# Patient Record
Sex: Female | Born: 1940 | Race: Black or African American | Hispanic: No | Marital: Single | Smoking: Never smoker
Health system: Southern US, Community
[De-identification: ages and names within clinical notes are randomized; demographics above are authoritative.]

## PROBLEM LIST (undated history)

## (undated) DIAGNOSIS — N186 End stage renal disease: Secondary | ICD-10-CM

## (undated) DIAGNOSIS — D649 Anemia, unspecified: Secondary | ICD-10-CM

## (undated) DIAGNOSIS — IMO0001 Reserved for inherently not codable concepts without codable children: Secondary | ICD-10-CM

## (undated) DIAGNOSIS — N189 Chronic kidney disease, unspecified: Secondary | ICD-10-CM

## (undated) DIAGNOSIS — C801 Malignant (primary) neoplasm, unspecified: Secondary | ICD-10-CM

## (undated) DIAGNOSIS — Z992 Dependence on renal dialysis: Secondary | ICD-10-CM

## (undated) DIAGNOSIS — R252 Cramp and spasm: Secondary | ICD-10-CM

## (undated) DIAGNOSIS — J189 Pneumonia, unspecified organism: Secondary | ICD-10-CM

## (undated) DIAGNOSIS — I10 Essential (primary) hypertension: Secondary | ICD-10-CM

## (undated) DIAGNOSIS — R011 Cardiac murmur, unspecified: Secondary | ICD-10-CM

## (undated) DIAGNOSIS — M1990 Unspecified osteoarthritis, unspecified site: Secondary | ICD-10-CM

## (undated) DIAGNOSIS — J3089 Other allergic rhinitis: Secondary | ICD-10-CM

## (undated) HISTORY — PX: BREAST SURGERY: SHX581

## (undated) HISTORY — PX: COLONOSCOPY W/ POLYPECTOMY: SHX1380

## (undated) HISTORY — PX: ABDOMINAL HYSTERECTOMY: SHX81

## (undated) HISTORY — PX: DILATION AND CURETTAGE OF UTERUS: SHX78

---

## 1998-02-24 ENCOUNTER — Emergency Department (HOSPITAL_COMMUNITY): Admission: EM | Admit: 1998-02-24 | Discharge: 1998-02-24 | Payer: Self-pay | Admitting: Emergency Medicine

## 1999-01-09 ENCOUNTER — Emergency Department (HOSPITAL_COMMUNITY): Admission: EM | Admit: 1999-01-09 | Discharge: 1999-01-09 | Payer: Self-pay | Admitting: Emergency Medicine

## 1999-01-09 ENCOUNTER — Encounter: Payer: Self-pay | Admitting: Emergency Medicine

## 1999-01-13 ENCOUNTER — Ambulatory Visit (HOSPITAL_COMMUNITY): Admission: RE | Admit: 1999-01-13 | Discharge: 1999-01-13 | Payer: Self-pay | Admitting: Internal Medicine

## 1999-01-13 ENCOUNTER — Encounter: Payer: Self-pay | Admitting: Internal Medicine

## 1999-09-22 ENCOUNTER — Ambulatory Visit (HOSPITAL_COMMUNITY): Admission: RE | Admit: 1999-09-22 | Discharge: 1999-09-22 | Payer: Self-pay | Admitting: Internal Medicine

## 1999-10-20 ENCOUNTER — Ambulatory Visit (HOSPITAL_COMMUNITY): Admission: RE | Admit: 1999-10-20 | Discharge: 1999-10-20 | Payer: Self-pay | Admitting: Gastroenterology

## 1999-10-20 ENCOUNTER — Encounter (INDEPENDENT_AMBULATORY_CARE_PROVIDER_SITE_OTHER): Payer: Self-pay | Admitting: Specialist

## 2000-10-10 ENCOUNTER — Other Ambulatory Visit: Admission: RE | Admit: 2000-10-10 | Discharge: 2000-10-10 | Payer: Self-pay | Admitting: Internal Medicine

## 2001-10-31 ENCOUNTER — Other Ambulatory Visit: Admission: RE | Admit: 2001-10-31 | Discharge: 2001-10-31 | Payer: Self-pay | Admitting: *Deleted

## 2001-11-10 ENCOUNTER — Ambulatory Visit (HOSPITAL_COMMUNITY): Admission: RE | Admit: 2001-11-10 | Discharge: 2001-11-10 | Payer: Self-pay | Admitting: *Deleted

## 2001-11-27 ENCOUNTER — Ambulatory Visit (HOSPITAL_COMMUNITY): Admission: RE | Admit: 2001-11-27 | Discharge: 2001-11-27 | Payer: Self-pay | Admitting: Internal Medicine

## 2001-11-29 ENCOUNTER — Ambulatory Visit (HOSPITAL_COMMUNITY): Admission: RE | Admit: 2001-11-29 | Discharge: 2001-11-29 | Payer: Self-pay | Admitting: Cardiology

## 2003-04-08 ENCOUNTER — Ambulatory Visit (HOSPITAL_COMMUNITY): Admission: RE | Admit: 2003-04-08 | Discharge: 2003-04-08 | Payer: Self-pay | Admitting: Gastroenterology

## 2004-01-10 ENCOUNTER — Ambulatory Visit (HOSPITAL_COMMUNITY): Admission: RE | Admit: 2004-01-10 | Discharge: 2004-01-10 | Payer: Self-pay | Admitting: *Deleted

## 2004-01-10 ENCOUNTER — Encounter (INDEPENDENT_AMBULATORY_CARE_PROVIDER_SITE_OTHER): Payer: Self-pay | Admitting: Specialist

## 2004-02-07 ENCOUNTER — Inpatient Hospital Stay (HOSPITAL_COMMUNITY): Admission: RE | Admit: 2004-02-07 | Discharge: 2004-02-10 | Payer: Self-pay | Admitting: *Deleted

## 2004-02-07 ENCOUNTER — Encounter (INDEPENDENT_AMBULATORY_CARE_PROVIDER_SITE_OTHER): Payer: Self-pay | Admitting: *Deleted

## 2004-02-07 ENCOUNTER — Encounter (INDEPENDENT_AMBULATORY_CARE_PROVIDER_SITE_OTHER): Payer: Self-pay | Admitting: Specialist

## 2009-11-13 ENCOUNTER — Encounter: Admission: RE | Admit: 2009-11-13 | Discharge: 2009-11-13 | Payer: Self-pay | Admitting: Internal Medicine

## 2010-09-18 NOTE — Op Note (Signed)
[facility]  Patient:    [name], [name] Visit Number: [id number] MRN: [id number]          Service Type: DSU Location: [facility] Attending Physician:  Shelah Lewandowsky Dictated by:   Syble Creek, M.D. Proc. Date: 11/10/01 Admit Date:  11/10/2001 Discharge Date: 11/10/2001   CC:         Lance Muss, M.D.   Operative Report  PREOPERATIVE DIAGNOSES:       Postmenopausal bleeding and cervical stenosis.  POSTOPERATIVE DIAGNOSES:      Postmenopausal bleeding and cervical stenosis.  PROCEDURE:                    Examination under anesthesia, attempted dilatation and curettage, however, could not dilate the cervical os due to extreme cervical stenosis.  SURGEON:                      Syble Creek, M.D.  ANESTHESIA:                   MAC and 20 cc 2% lidocaine.  FINDINGS:                     A virginal introitus of the vagina, completely stenotic cervical os (os was barely visible).  On bimanual examination uterus was mid position, normal size.  No adnexal masses.  Rectovaginal examination confirmed above findings and there was no parametrial abnormality noted on rectovaginal examination.  ESTIMATED BLOOD LOSS:         Less than 50 cc.  COMPLICATIONS:                None.  SPECIMEN:                     None.  DISPOSITION:                  Recovery room, stable.  INDICATIONS:                  A patient with postmenopausal bleeding and could not tolerate an examination in the office.  PROCEDURE:                    The patient was taken to the operating room and MAC anesthesia obtained.  She was placed in the ski position and prepped and draped in a standard fashion.  The bladder was emptied with a red rubber catheter.  A small Peterson speculum was necessary to visualize the cervix.  It was nulliparous in appearance.  The cervical os itself was barely visible as a pin point.  There was a small amount of mucus extruded through it as I placed  the paracervical block.  Attempted to use a tear duct probe to enter into the stenotic cervical os.  However, it would not allow passage.  Therefore, the procedure was terminated.  The patient tolerated procedure well.  There were no complications.  She was taken to the recovery room awake, alert, in stable condition.  The patient will follow up in the office in four weeks and should she have additional postmenopausal bleeding, I would recommend a vaginal ultrasound, perhaps with sedation if necessary, to visualize the endometrial stripe. Dictated by:   Syble Creek, M.D. Attending Physician:  Syble Creek B DD:  11/10/01 TD:  11/13/01 Job: [id number]

## 2010-09-18 NOTE — Procedures (Signed)
[place]  Patient:    [name], [name]                      MRN: [id number] Proc. Date: 10/20/99 Adm. Date:  [id number] Disc. Date: [id number] Attending:  Orvis Brill CC:         Lance Muss, M.D.                           Procedure Report  PROCEDURES:  Colonoscopy with polypectomy.  INDICATIONS:  Polyp on flexible sigmoidoscopy done for screening.  Consent was signed after the risks, benefits, methods, and options were thoroughly discussed in the office.  MEDICINES USED:  Demerol 80 mg and Versed 5 mg.  DESCRIPTION OF PROCEDURE:  The rectal inspection was pertinent for external hemorrhoids.  The digital exam was negative.  The video colonoscope was inserted.  In the lower sigmoid, the two polyps that Lance Muss, M.D., had seen on insertion were clearly seen.  The scope was then advanced to the splenic flexure.  At that point, she had a tortuous splenic flexure and with abdominal pressure we were able to advance around the colon to the cecum.  The prep was adequate.  There was a fair amount of liquid stool that required washing and suctioning, but no slow withdraw through the colon, no cecum, ascending, transverse, or descending abnormalities were seen.  The cecum was identified by the appendiceal orifice and the ileocecal valve.  As the scope was withdrawn around the sigmoid, a tiny 2-3 mm polyp was seen and was hot biopsied x 1.  The larger of the polyps seen on the sigmoidoscopy was brought into view, snared, electrocautery applied, and the polyp was suctioned through the scope and collected in trap.  Two other smaller polyps were seen and were hot biopsied in the sigmoid.  The scope was withdrawn back to the rectum and retroflexed, pertinent for some internal hemorrhoids with some old anal papillae.  The scope was straightened, the air was withdrawn, and the cope was removed.  The patient tolerated the procedure well.  There  was no obvious immediate complication.  ENDOSCOPIC DIAGNOSES: 1. Internal and external hemorrhoids with anal papillae. 2. Four small sigmoid polyps, three hot biopsied and one snared. 3. Tortuous, particularly in the splenic flexure. 4. Otherwise within normal limits to the cecum.  PLAN:  Await pathology to determine future colonic screening.  Will probably recheck in three years.  Otherwise return care to Lance Muss, M.D., for the customary yearly rectals and guaiacs.  Put her on 10-day post polypectomy instructions.  Asked to see back p.r.n. DD:  10/20/99 TD:  10/22/99 Job: [id number]

## 2010-09-18 NOTE — Op Note (Signed)
NAMESALSABEEL, KINKADE NO.:  [id number]   MEDICAL RECORD NO.:  [id number]          PATIENT TYPE:  INP   LOCATION:  [place]                         FACILITY:  [facility]   PHYSICIAN:  Lake Bells B. Rosana Hoes, M.D.  DATE OF BIRTH:  15-Jun-1940   DATE OF PROCEDURE:  02/07/2004  DATE OF DISCHARGE:                                 OPERATIVE REPORT   PREOPERATIVE DIAGNOSES:  Well differentiated endometrial cancer.   POSTOPERATIVE DIAGNOSES:  Well differentiated endometrial cancer.   PROCEDURE:  Total abdominal hysterectomy, bilateral salpingo-oophorectomy,  pelvic and abdominal washing.   SURGEON:  Blair Dolphin. Rosana Hoes, M.D.   ASSISTANT:  Freddie Apley, M.D.   ANESTHESIA:  General.   FINDINGS:  Well differentiated endometrial carcinoma minimally invasive.   SPECIMENS:  Uterus, cervix, tubes and ovaries.   ESTIMATED BLOOD LOSS:  375.   URINE:  250.   FLUIDS:  2500.   COMPLICATIONS:  None.   DRAINS:  Foley.   INDICATIONS FOR PROCEDURE:  A patient with a history of postmenopausal  bleeding and cervical stenosis. 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 The patient presents for definitive surgical therapy.   DESCRIPTION OF PROCEDURE:  The patient was taken to the operating room and  general anesthesia obtained.  She was prepped and draped in standard fashion  and Foley catheter inserted into the bladder.   A vertical incision was made with the knife, carried sharply to the fascia.  The fascia was divided sharply and the posterior sheath and peritoneum  elevated and entered sharply.  The incision was extended inferiorly with  good visualization of the surrounding organs.   Pelvic and abdominal washings were obtained.   The pelvis was inspected and there was no evidence of any gross spread. The  uterine serosa, tubes and ovaries all appeared normal. The upper abdomen was  normal.   Trendelenburg position obtained. The Bookwalter retractor assembled, bowel  packed superiorly with moist packs.   Each uterine cornu was grasped with a Kelly clamp. The right round ligament  was placed on traction, suture ligated, divided.  The posterior sheath of  the peritoneum was divided sharply and the retroperitoneal space developed  bluntly. The course of the ureter identified. The IP ligament was isolated,  doubly clamped, divided, suture ligated x2 with #0 Vicryl and hemostasis  obtained. The procedure repeated on the left side in the same fashion.   The bladder flap was created with sharp dissection.  Each uterine artery was  skeletonized, doubly clamped, divided and suture ligated 2 #0 Vicryl with  hemostasis obtained. The cardinal ligaments were then serially clamped on  each side, divided and suture ligated with #0 Vicryl with hemostasis  obtained. The bladder was advanced further to allow the dissection to  continue. The vaginal angle was clamped on each side, divided, suture  ligated with #0 Vicryl with hemostasis obtained. It felt like we were beyond  the cervix; however, when coming across the vagina it was clear that the  cervix was transected at its most distal portion.  It was  very flush with  the vagina and difficult to palpate. The uterus and majority of the cervix  were then sent for frozen section analysis with the above findings. The  remainder of the cervix was circumscribed with the scissors and sent for  permanent sections.   The vaginal cuff was closed with interrupted figure-of-eight sutures of #0  Vicryl. The vaginal angle and uterosacral ligament were tied together for  support of the cuff.   The pelvis was irrigated and the cuff inspected. It was hemostatic. All  other lines of dissection were also hemostatic.   Given that the pathology showed minimal invasion and was well  differentiated, it was Dr. Alysia Penna opinion and mine that node dissection   was not necessary.   The Bookwalter was removed, packs removed and bowel returned to the anatomic  position.  The fascia was closed with a running stitch of doubly stranded #0  Prolene. The subcutaneous tissue was irrigated and made hemostatic with the  Bovie. It was reapproximated with a running stitch of plain suture. The skin  was closed with staples.   The patient tolerated the procedure well with no complications. She was  taken to the recovery room awake, alert in stable condition. All counts were  correct per the operating room staff.     Wesl   WBD/MEDQ  D:  02/07/2004  T:  02/07/2004  Job:  [id number]

## 2010-09-18 NOTE — Cardiovascular Report (Signed)
   NAMEARRAYA, PLATTEN NO.:  [id number]   MEDICAL RECORD NO.:  [id number]                   PATIENT TYPE:  OIB   LOCATION:  2858                                 FACILITY:  [place]   PHYSICIAN:  Minus Breeding, M.D. [facility]            DATE OF BIRTH:  [birth year]   DATE OF PROCEDURE:  DATE OF DISCHARGE:  11/29/2001                              CARDIAC CATHETERIZATION   PROCEDURE:  Left heart catheterization/coronary arteriography.   INDICATIONS FOR PROCEDURE:  Evaluate patient with a markedly abnormal  exercise treadmill test and new-onset dyspnea with exertion.   DESCRIPTION OF PROCEDURE:  Left heart catheterization performed through the  right femoral artery.  The artery was cannulated using an anterior wall  puncture.  A 6 French arterial sheath was inserted via modified Seldinger  technique.  Preformed Judkins and a pigtail catheter were utilized.  The  patient tolerated the procedure well and left the lab in stable condition.   RESULTS:  HEMODYNAMICS:  LV 142/21, AO 142/75.   CORONARIES:  The left main was normal.  The LAD was normal.  The circumflex  was large and normal.  The right coronary artery was dominant and was  somewhat small.  It was normal.   LEFT VENTRICULOGRAM:  The left ventriculogram was obtained in the RAO  projection.  EF of 65%, with normal wall motion.   CONCLUSION:  1. Normal coronary arteries.  2. Normal left ventricular function.   PLAN:  The patient did have an abnormal treadmill test, which represents a  false positive for evidence of obstructive coronary disease.  However, it  did also demonstrate a hypertensive blood pressure response.  She will be  followed closely by Dr. Nyoka Cowden for management of this, as I suspect this  might be contributing to her dyspnea with exertion.  We discussed weight  loss and exercise.  No further cardiovascular testing is suggested.                                                 Minus Breeding, M.D. [facility]    JH/MEDQ  D:  11/29/2001  T:  12/05/2001  Job:  [id number]   cc:   Lance Muss, M.D.

## 2010-09-18 NOTE — Discharge Summary (Signed)
[name], [name] NO.:  [id number]   MEDICAL RECORD NO.:  [id number]          PATIENT TYPE:  INP   LOCATION:  [place]                         FACILITY:  [facility]   PHYSICIAN:  Lake Bells B. Rosana Hoes, M.D.  DATE OF BIRTH:  1940-11-11   DATE OF ADMISSION:  02/07/2004  DATE OF DISCHARGE:  02/10/2004                                 DISCHARGE SUMMARY   ADMISSION DIAGNOSES:  1.  Postmenopausal bleeding.  2.  Well-differentiated endometrial carcinoma.   DISCHARGE DIAGNOSES:  1.  Postmenopausal bleeding.  2.  Well-differentiated endometrial carcinoma.   HISTORY OF PRESENT ILLNESS:  For complete details please see the H&P in the  chart.  Briefly the patient presented as a 70 year old African-American  female with a history of postmenopausal bleeding and biopsy proven well-  differentiated endometrial carcinoma for surgical management.   HOSPITAL COURSE:  On the day of admission the patient underwent total  abdominal hysterectomy and bilateral salpingo-oophorectomy.  Findings at the  time of surgery include a well-differentiated minimally invasive endometrial  carcinoma  with no evidence of any spread.   Postoperatively the patient rapidly regained her ability to ambulate, void,  and tolerate a regular diet.  She was discharged home on the third  postoperative day in satisfactory condition.  During her hospital stay she  was given Lovenox for DVT prophylaxis as well as serial compression devices  on the lower extremities.  She was also given atenolol for cardiac  protection.   DISCHARGE INSTRUCTIONS:   FOLLOW UP:  Follow up at the end of this week for staple removal and in four  weeks for postoperative visit.  No heavy lifting and nothing in the vagina  for six weeks.  No driving for two weeks.  Call with any concerns including  fever, pain, nausea, or vomiting, or other issues.   DISPOSITION:  Satisfactory.   DISCHARGE MEDICATIONS:  1.  Darvocet 1-2 every 4-6 hours as  needed for pain.  2.  Hydrochlorothiazide one tablet daily as prior to admission.  3.  Zofran ODT 8 mg every 12 hours for nausea.   CONDITION ON DISCHARGE:  Satisfactory.     Wesl   WBD/MEDQ  D:  02/10/2004  T:  02/10/2004  Job:  [id number]

## 2010-09-18 NOTE — Op Note (Signed)
NAMESAMAIA, FANELLA                         ACCOUNT NO.:  [id number]   MEDICAL RECORD NO.:  [id number]                   PATIENT TYPE:  AMB   LOCATION:  [place]O                                 FACILITY:  [facility]   PHYSICIAN:  Jeryl Columbia, M.D.                 DATE OF BIRTH:  [birth year]   DATE OF PROCEDURE:  04/08/2003  DATE OF DISCHARGE:                                 OPERATIVE REPORT   PROCEDURE:  Colonoscopy.   INDICATIONS FOR PROCEDURE:  History of colon polyps, due for repeat  screening.  Consent was signed  after the risks and benefits, methods and  options were thoroughly discussed in the office in the past.   MEDICATIONS USED:  Demerol 75, Versed 6.   DESCRIPTION OF PROCEDURE:  The rectal inspection was pertinent for external  hemorrhoids. The digital examination was negative. The video pediatric  adjustable colonoscope was inserted and fairly easily advanced  around the  colon to the cecum. This did require some abdominal pressure but no position  changes. The cecum was identified by the appendiceal orifice and the  ileocecal valve. The scope was slowly withdrawn.   The prep was adequate. There was some liquid stool which required  some  washing and suctioning, but on slow withdrawal through the colon no  abnormalities were seen, specifically no polyps, diverticula or other  abnormalities. Once back in the  rectum, anorectal pullthrough and  retroflexion confirmed some small hemorrhoids.   The scope was straightened. Air was suctioned. The scope was removed. The  patient tolerated the procedure well. There was no obvious immediate  complication.   ENDOSCOPIC DIAGNOSIS:  1. Internal and external hemorrhoids.  2. Otherwise within normal limits to the cecum.   PLAN:  Yearly rectals and Guaiacs per Dr. Nyoka Cowden. Will be happy to see him  back  p.r.n., otherwise  repeat  colon screening in 5 years or as needed  sooner p.r.n.     Jeryl Columbia, M.D.    MEM/MEDQ  D:  04/08/2003  T:  04/08/2003  Job:  [id number]

## 2010-09-18 NOTE — Op Note (Signed)
NAMEJENNIFFER, [name]                         ACCOUNT NO.:  [id number]   MEDICAL RECORD NO.:  [id number]                   PATIENT TYPE:  AMB   LOCATION:  [place]                                  FACILITY:  [place]   PHYSICIAN:  Woodson B. Rosana Hoes, M.D.               DATE OF BIRTH:  09-21-1940   DATE OF PROCEDURE:  01/10/2004  DATE OF DISCHARGE:                                 OPERATIVE REPORT   PREOPERATIVE DIAGNOSES:  Postmenopausal bleeding, cervical stenosis and  thickened endometrium on ultrasound.   POSTOPERATIVE DIAGNOSES:  Postmenopausal bleeding, cervical stenosis and  thickened endometrium on ultrasound.   PROCEDURE:  Examination under anesthesia, ultrasound guided D&C.   SURGEON:  Blair Dolphin. Rosana Hoes, M.D.   ANESTHESIA:  LMA which was converted to general endotracheal due to poor  airway control with the LMA.   FINDINGS:  Thickened endometrium on ultrasound and extreme cervical  stenosis.   ESTIMATED BLOOD LOSS:  Minimal.   SPECIMENS:  Endometrial curettings.   INDICATIONS FOR PROCEDURE:  Patient with a history of postmenopausal  bleeding. Approximately two years go, she had one episode of bleeding.  An  attempt was made at endometrial biopsy in the office which was unsuccessful  and subsequent exam under anesthesia the cervical os could not be identified  as the cervix was so extremely stenotic. Given this, I presume the patient's  bleeding might be due to vaginal atrophy.  I had instructed the patient to  followup in the office with any further episode of bleeding for further  evaluation. The patient did not keep any further followup but had no further  bleeding until earlier this month. Ultrasound in the office showed a  thickened endometrium and again exam in the office confirmed an extremely  stenotic cervical os.  However an os could be visualized at this point. The  patient was treated preoperatively with Cytotec 200 mcg per vagina the night  prior to the surgery and we  arranged for ultrasound guidance in the OR to  facilitate cervical dilatation.   The patient was advised of the risks of surgery including infection,  bleeding, uterine perforation and damage to surrounding organs.   PLAN:  Attempted hysteroscopy if the cervix could be dilated enough or if  not at least to obtain a D&C specimen.   DESCRIPTION OF PROCEDURE:  The patient was taken to the operating room and  LMA anesthesia obtained. She was prepped and draped in standard fashion and  the bladder left undrained to facilitate ultrasound.  The speculum was  inserted and the patient's airway was noted to be difficult to manage with  the LMA therefore the procedure was put temporarily on hold. The patient was  converted to general anesthesia for better airway management and then we  proceeded.   Under ultrasound guidance, the uterus and cervix were visualized.  It was  difficult to visualize the junction between the lower uterine  segment and  cervix due to inadequate filling of the bladder. Therefore the bladder was  drained with a red rubber catheter and approximately 200 mL of warm saline  instilled into the bladder. This allowed better visualization.   A tear duct probe was then placed into the cervical os and it could be  tracked on ultrasound and ultimately passed up into the lower uterine  segment with extreme difficulty.  Several attempts were made to pass  slightly larger dilators such as the pediatric dilators, however, none of  these could be placed into the lower uterine segment.  Ultimately, I could  pass a Pipelle biopsy catheter into the lower uterine segment and obtained a  moderate amount of tissue with several passes.  It was clear that further  dilatation would be very difficult as the patient had increased risk for  uterine perforation. Given an adequate specimen was obtained, the procedure  was terminated.   The instruments were removed from the cervix and it was  hemostatic.   The patient tolerated the procedure well, there were no complications.  She  was taken to the recovery room awake, alert in stable condition.                                               Lake Bells B. Rosana Hoes, M.D.    WBD/MEDQ  D:  01/10/2004  T:  01/11/2004  Job:  [id number]

## 2010-09-18 NOTE — H&P (Signed)
[name], [name]             ACCOUNT NO.:  [id number]   MEDICAL RECORD NO.:  [id number]          PATIENT TYPE:  INP   LOCATION:  NA                           [facility]ACILITY[facility]   PHYSICIAN:  Lake Bells B. Rosana Hoes, M.D.  DATE OF BIRTH:  December 04, 1940   DATE OF ADMISSION:  02/07/2004  DATE OF DISCHARGE:                                HISTORY & PHYSICAL   PREOPERATIVE DIAGNOSES:  Well differentiated endometrial carcinoma.   INTENDED PROCEDURE:  Total abdominal hysterectomy, bilateral salpingo-  oophorectomy, staging biopsies and lymph node dissection.   HISTORY OF PRESENT ILLNESS:  A 70 year old African-American female with a  recent history of postmenopausal bleeding. She had cervical stenosis and  underwent ultrasound guided D&C.  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  The patient presents for definitive surgical therapy and  staging.   PAST MEDICAL HISTORY:  1.  Hypertension.  2.  Carotid bruit.  3.  Lymphocytosis.   PAST SURGICAL HISTORY:  D&C as above, breast cyst, thigh mass removed and  colon polyps.   PREMEDICATION:  Hydrochlorothiazide, aspirin daily.   ALLERGIES:  None.   SOCIAL HISTORY:  No alcohol, tobacco or drugs.   FAMILY HISTORY:  Noncontributory.   REVIEW OF SYMPTOMS:  Otherwise negative.  Recently negative cardiac  catheterization for history of carotid bruit.   PHYSICAL EXAMINATION:  VITAL SIGNS:  Blood pressure 130/80, pulse 90, weight  206, height 5 foot 1 1/4 inch.  GENERAL:  Alert and oriented in no acute distress.  SKIN:  Warm, dry and no lesions.  HEART:  Regular rate and rhythm.  LUNGS:  Clear to auscultation.  ABDOMEN:  Liver and spleen normal. No hernia.  PELVIC:  Normal external genitalia, vagina normal, cervix stenotic, atrophic  but otherwise normal. No masses palpable. Adnexa negative.  Rectovaginal  exam confirms above findings.   ASSESSMENT:  Well  differentiated adenocarcinoma and history of  postmenopausal bleeding.   PLAN:  Total abdominal hysterectomy, bilateral salpingo-oophorectomy, lymph  node dissection and staging biopsies.  Operative risks discussed with the  patient including infection, bleeding, damage to surrounding organs and  increased risk of deep venous thrombosis. The patient will receive Lovenox  postop and serial compression devices intraoperatively and postoperatively  to reduce her DVT risk. All questions answered, the patient wished to  proceed.     Wesl   WBD/MEDQ  D:  02/05/2004  T:  02/05/2004  Job:  [id number]

## 2011-02-24 ENCOUNTER — Inpatient Hospital Stay (INDEPENDENT_AMBULATORY_CARE_PROVIDER_SITE_OTHER)
Admission: RE | Admit: 2011-02-24 | Discharge: 2011-02-24 | Disposition: A | Discharge status: Home / Self Care | Payer: Medicare Other | Source: Ambulatory Visit | Attending: Emergency Medicine | Admitting: Emergency Medicine

## 2011-02-24 DIAGNOSIS — T6391XA Toxic effect of contact with unspecified venomous animal, accidental (unintentional), initial encounter: Secondary | ICD-10-CM

## 2013-04-03 ENCOUNTER — Ambulatory Visit
Admission: RE | Admit: 2013-04-03 | Discharge: 2013-04-03 | Disposition: A | Discharge status: Home / Self Care | Payer: Medicare Other | Source: Ambulatory Visit | Attending: Internal Medicine | Admitting: Internal Medicine

## 2013-04-03 ENCOUNTER — Other Ambulatory Visit: Payer: Self-pay | Admitting: Internal Medicine

## 2013-04-03 DIAGNOSIS — R52 Pain, unspecified: Secondary | ICD-10-CM

## 2014-08-06 ENCOUNTER — Other Ambulatory Visit: Payer: Self-pay | Admitting: Internal Medicine

## 2014-08-06 DIAGNOSIS — R748 Abnormal levels of other serum enzymes: Secondary | ICD-10-CM

## 2014-08-07 ENCOUNTER — Ambulatory Visit
Admission: RE | Admit: 2014-08-07 | Discharge: 2014-08-07 | Disposition: A | Discharge status: Home / Self Care | Payer: PRIVATE HEALTH INSURANCE | Source: Ambulatory Visit | Attending: Internal Medicine | Admitting: Internal Medicine

## 2014-08-07 DIAGNOSIS — R748 Abnormal levels of other serum enzymes: Secondary | ICD-10-CM

## 2014-09-04 ENCOUNTER — Other Ambulatory Visit: Payer: Self-pay | Admitting: Nephrology

## 2014-09-04 DIAGNOSIS — N179 Acute kidney failure, unspecified: Secondary | ICD-10-CM

## 2014-09-05 ENCOUNTER — Other Ambulatory Visit (HOSPITAL_COMMUNITY): Payer: Self-pay | Admitting: Nephrology

## 2014-09-05 DIAGNOSIS — I159 Secondary hypertension, unspecified: Secondary | ICD-10-CM

## 2014-09-09 ENCOUNTER — Ambulatory Visit (HOSPITAL_COMMUNITY)
Admission: RE | Admit: 2014-09-09 | Discharge: 2014-09-09 | Disposition: A | Discharge status: Home / Self Care | Payer: Medicare Other | Source: Ambulatory Visit | Attending: Nephrology | Admitting: Nephrology

## 2014-09-09 DIAGNOSIS — I159 Secondary hypertension, unspecified: Secondary | ICD-10-CM | POA: Diagnosis present

## 2014-09-10 ENCOUNTER — Other Ambulatory Visit (HOSPITAL_COMMUNITY)
Admission: RE | Admit: 2014-09-10 | Discharge: 2014-09-10 | Disposition: A | Discharge status: Home / Self Care | Payer: Medicare Other | Source: Ambulatory Visit | Attending: Nephrology | Admitting: Nephrology

## 2014-09-11 ENCOUNTER — Other Ambulatory Visit (HOSPITAL_COMMUNITY): Payer: Self-pay | Admitting: Nephrology

## 2014-09-11 ENCOUNTER — Encounter (HOSPITAL_COMMUNITY)
Admission: RE | Admit: 2014-09-11 | Discharge: 2014-09-11 | Disposition: A | Discharge status: Home / Self Care | Payer: PRIVATE HEALTH INSURANCE | Source: Ambulatory Visit | Attending: Nephrology | Admitting: Nephrology

## 2014-09-11 ENCOUNTER — Other Ambulatory Visit: Payer: Self-pay | Admitting: Nephrology

## 2014-09-11 DIAGNOSIS — N179 Acute kidney failure, unspecified: Secondary | ICD-10-CM | POA: Diagnosis not present

## 2014-09-11 DIAGNOSIS — Z01812 Encounter for preprocedural laboratory examination: Secondary | ICD-10-CM | POA: Insufficient documentation

## 2014-09-11 DIAGNOSIS — N049 Nephrotic syndrome with unspecified morphologic changes: Secondary | ICD-10-CM

## 2014-09-11 LAB — COMPREHENSIVE METABOLIC PANEL
ALT: 35 U/L (ref 14–54)
AST: 42 U/L — ABNORMAL HIGH (ref 15–41)
Albumin: 1.9 g/dL — ABNORMAL LOW (ref 3.5–5.0)
Alkaline Phosphatase: 108 U/L (ref 38–126)
Anion gap: 11 (ref 5–15)
BUN: 31 mg/dL — ABNORMAL HIGH (ref 6–20)
CHLORIDE: 105 mmol/L (ref 101–111)
CO2: 25 mmol/L (ref 22–32)
CREATININE: 3.82 mg/dL — AB (ref 0.44–1.00)
Calcium: 7.1 mg/dL — ABNORMAL LOW (ref 8.9–10.3)
GFR calc Af Amer: 12 mL/min — ABNORMAL LOW (ref >60)
GFR calc non Af Amer: 11 mL/min — ABNORMAL LOW (ref >60)
Glucose, Bld: 124 mg/dL — ABNORMAL HIGH (ref 70–99)
Potassium: 3.2 mmol/L — ABNORMAL LOW (ref 3.5–5.1)
Sodium: 141 mmol/L (ref 135–145)
Total Bilirubin: 0.8 mg/dL (ref 0.3–1.2)
Total Protein: 5.7 g/dL — ABNORMAL LOW (ref 6.5–8.1)

## 2014-09-11 LAB — PLATELET FUNCTION ASSAY: Collagen / Epinephrine: 93 seconds (ref 0–193)

## 2014-09-11 LAB — TYPE AND SCREEN
ABO/RH(D): A POS
Antibody Screen: NEGATIVE

## 2014-09-11 LAB — ABO/RH: ABO/RH(D): A POS

## 2014-09-12 ENCOUNTER — Other Ambulatory Visit: Payer: PRIVATE HEALTH INSURANCE

## 2014-09-12 DIAGNOSIS — N059 Unspecified nephritic syndrome with unspecified morphologic changes: Secondary | ICD-10-CM | POA: Diagnosis present

## 2014-09-13 ENCOUNTER — Encounter (HOSPITAL_COMMUNITY): Payer: Self-pay

## 2014-09-13 ENCOUNTER — Observation Stay (HOSPITAL_COMMUNITY)
Admission: RE | Admit: 2014-09-13 | Discharge: 2014-09-14 | Disposition: A | Discharge status: Home / Self Care | Payer: Medicare Other | Source: Ambulatory Visit | Attending: Nephrology | Admitting: Nephrology

## 2014-09-13 VITALS — BP 160/72 | HR 62 | Temp 98.3°F | Resp 17 | Ht 69.0 in | Wt 219.0 lb

## 2014-09-13 DIAGNOSIS — N059 Unspecified nephritic syndrome with unspecified morphologic changes: Secondary | ICD-10-CM | POA: Diagnosis present

## 2014-09-13 DIAGNOSIS — I10 Essential (primary) hypertension: Secondary | ICD-10-CM | POA: Insufficient documentation

## 2014-09-13 DIAGNOSIS — N179 Acute kidney failure, unspecified: Secondary | ICD-10-CM | POA: Diagnosis not present

## 2014-09-13 DIAGNOSIS — Z6832 Body mass index (BMI) 32.0-32.9, adult: Secondary | ICD-10-CM | POA: Insufficient documentation

## 2014-09-13 DIAGNOSIS — N049 Nephrotic syndrome with unspecified morphologic changes: Secondary | ICD-10-CM | POA: Diagnosis present

## 2014-09-13 DIAGNOSIS — E669 Obesity, unspecified: Secondary | ICD-10-CM | POA: Diagnosis not present

## 2014-09-13 HISTORY — DX: Essential (primary) hypertension: I10

## 2014-09-13 HISTORY — DX: Cardiac murmur, unspecified: R01.1

## 2014-09-13 LAB — CBC
HEMATOCRIT: 27.6 % — AB (ref 36.0–46.0)
Hemoglobin: 9.4 g/dL — ABNORMAL LOW (ref 12.0–15.0)
MCH: 32.8 pg (ref 26.0–34.0)
MCHC: 34.1 g/dL (ref 30.0–36.0)
MCV: 96.2 fL (ref 78.0–100.0)
Platelets: 259 10*3/uL (ref 150–400)
RBC: 2.87 MIL/uL — AB (ref 3.87–5.11)
RDW: 12.7 % (ref 11.5–15.5)
WBC: 4.1 10*3/uL (ref 4.0–10.5)

## 2014-09-13 MED ORDER — SODIUM CHLORIDE 0.9 % IV SOLN: INTRAVENOUS | Status: DC

## 2014-09-13 MED ORDER — LORAZEPAM 0.5 MG PO TABS
ORAL_TABLET | ORAL | Status: AC
  Administered 2014-09-13: 4 mg via ORAL
  Filled 2014-09-13: qty 8

## 2014-09-13 MED ORDER — ONDANSETRON HCL 4 MG/2ML IJ SOLN
INTRAMUSCULAR | Status: AC
  Filled 2014-09-13: qty 2

## 2014-09-13 MED ORDER — ONDANSETRON HCL 4 MG/2ML IJ SOLN
4.0000 mg | Freq: Once | INTRAMUSCULAR | Status: AC
  Administered 2014-09-13: 4 mg via INTRAVENOUS

## 2014-09-13 MED ORDER — SODIUM CHLORIDE 0.9 % IV SOLN
INTRAVENOUS | Status: DC
  Administered 2014-09-13: 12:00:00 via INTRAVENOUS

## 2014-09-13 MED ORDER — OXYCODONE-ACETAMINOPHEN 5-325 MG PO TABS: 1.0000 | ORAL_TABLET | ORAL | Status: DC | PRN

## 2014-09-13 MED ORDER — LORAZEPAM 2 MG PO TABS
4.0000 mg | ORAL_TABLET | Freq: Once | ORAL | Status: AC
  Administered 2014-09-13: 4 mg via ORAL

## 2014-09-13 MED ORDER — ACETAMINOPHEN 500 MG PO TABS
500.0000 mg | ORAL_TABLET | Freq: Four times a day (QID) | ORAL | Status: DC | PRN
  Filled 2014-09-13: qty 2

## 2014-09-13 MED ORDER — LIDOCAINE HCL (PF) 1 % IJ SOLN
INTRAMUSCULAR | Status: AC
  Filled 2014-09-13: qty 10

## 2014-09-13 MED ORDER — NEBIVOLOL HCL 10 MG PO TABS
10.0000 mg | ORAL_TABLET | Freq: Every day | ORAL | Status: DC
  Administered 2014-09-13: 10 mg via ORAL
  Filled 2014-09-13 (×3): qty 1

## 2014-09-13 MED ORDER — LIDOCAINE HCL (PF) 1 % IJ SOLN
INTRAMUSCULAR | Status: AC
  Filled 2014-09-13: qty 30

## 2014-09-13 NOTE — Procedures (Signed)
Patient in prone position.  Kidneys localized with U/S.  Prep clorohexadine, xylocaine LA.  Using U/S guidance 3 passes to obtain 3 cores of tissue.  EBL 10.  Tolerated well.

## 2014-09-13 NOTE — H&P (Signed)
[name] is an 74 y.o. female.  HPI: 74 yr old female with HTN over 33yr , more difficult to control past 1 yr.  In past 1-2 mon, severe edema. Cr was 1.4 6/15 and now 3.8-4.3.  Has 8.3 gm protein in 24h urine and neg serologies.  Admitted for renal biopsy.     Primary Nephrologist [name].Marland Kitchen  No past medical history on file.  Allergies: No Known Allergies  Medications: {medication reviewed/display:[id number] Results for orders placed or performed during the hospital encounter of 09/11/14 (from the past 48 hour(s))  Type and screen     Status: None   Collection Time: 09/11/14  9:20 AM  Result Value Ref Range   ABO/RH(D) A POS    Antibody Screen NEG    Sample Expiration 09/14/2014   ABO/Rh     Status: None   Collection Time: 09/11/14  9:20 AM  Result Value Ref Range   ABO/RH(D) A POS   Comprehensive metabolic panel     Status: Abnormal   Collection Time: 09/11/14  9:21 AM  Result Value Ref Range   Sodium 141 135 - 145 mmol/L   Potassium 3.2 (L) 3.5 - 5.1 mmol/L   Chloride 105 101 - 111 mmol/L   CO2 25 22 - 32 mmol/L   Glucose, Bld 124 (H) 70 - 99 mg/dL   BUN 31 (H) 6 - 20 mg/dL   Creatinine, Ser 3.82 (H) 0.44 - 1.00 mg/dL   Calcium 7.1 (L) 8.9 - 10.3 mg/dL   Total Protein 5.7 (L) 6.5 - 8.1 g/dL   Albumin 1.9 (L) 3.5 - 5.0 g/dL   AST 42 (H) 15 - 41 U/L   ALT 35 14 - 54 U/L   Alkaline Phosphatase 108 38 - 126 U/L   Total Bilirubin 0.8 0.3 - 1.2 mg/dL   GFR calc non Af Amer 11 (L) >60 mL/min   GFR calc Af Amer 12 (L) >60 mL/min    Comment: (NOTE) The eGFR has been calculated using the CKD EPI equation. This calculation has not been validated in all clinical situations. eGFR's persistently <60 mL/min signify possible Chronic Kidney Disease.    Anion gap 11 5 - 15  Platelet function assay     Status: None   Collection Time: 09/11/14  9:21 AM  Result Value Ref Range   PFA Interpretation            Comment: Platelet function is normal. If patient  history/physical examination give strong indication of a bleeding disorder repeat testing for confirmation.        Results of the test should always be interpreted in conjunction with the patient's medical history, clinical presentation and medication history. Patients with Hematocrit values <35.0% or Platelet counts <150,000/uL may result in values above the Laboratory established reference range.    Collagen / Epinephrine 93 0 - 193 seconds    Comment: Performed at [facility]    No results found.  There were no vitals taken for this visit. General appearance: alert, cooperative and mildly obese Head: Normocephalic, without obvious abnormality, atraumatic Eyes: art narrowing and AV nicking Ears: normal TM's and external ear canals both ears Neck: thyroid not enlarged, symmetric, no tenderness/mass/nodules and PCL Resp: clear to auscultation bilaterally and normal percussion bilaterally Chest wall: no tenderness Cardio: S1, S2 normal and systolic murmur: holosystolic 2/6, blowing at apex GI: soft, non-tender; bowel sounds normal; no masses,  no organomegaly and mild obesity Extremities: edema 3-4+ Skin: Skin color, texture, turgor normal.  No rashes or lesions Lymph nodes: Cervical adenopathy: PCL Neurologic: Grossly normal  Assessment/Plan: 1 Nephrotic syndrome 2 AKI suspect subacute but drastic change in GFR.  Needs to sort out as high risk ESRD.  Edema better with Lasix. And Bp better with diuresis 3 HTN 4 obesity P Renal biopsy for dx and prognosis  [name] L 09/13/2014, 7:29 AM

## 2014-09-13 NOTE — Progress Notes (Signed)
Pt voided blood tinged urine.

## 2014-09-14 DIAGNOSIS — N049 Nephrotic syndrome with unspecified morphologic changes: Secondary | ICD-10-CM | POA: Diagnosis not present

## 2014-09-14 LAB — CBC
HCT: 22.4 % — ABNORMAL LOW (ref 36.0–46.0)
Hemoglobin: 7.7 g/dL — ABNORMAL LOW (ref 12.0–15.0)
MCH: 32.8 pg (ref 26.0–34.0)
MCHC: 34.4 g/dL (ref 30.0–36.0)
MCV: 95.3 fL (ref 78.0–100.0)
Platelets: 210 10*3/uL (ref 150–400)
RBC: 2.35 MIL/uL — AB (ref 3.87–5.11)
RDW: 12.9 % (ref 11.5–15.5)
WBC: 4.7 10*3/uL (ref 4.0–10.5)

## 2014-09-14 NOTE — Discharge Instructions (Signed)
Kidney Biopsy A biopsy is a test that involves collecting small pieces of tissue, usually with a needle. The tissue is then examined under a microscope. A kidney biopsy can help a health care provider make a diagnosis and determine the best course of treatment. Your health care provider may recommend a kidney biopsy if you have any of the following conditions:  Blood in your urine (hematuria).  Excessive protein in your urine (proteinuria).  Impaired kidney function that causes excessive waste products in your blood. A specialist will look at the kidney tissue samples to check for unusual deposits, scarring, or infecting organisms that would explain your condition. If you have a kidney transplant, a biopsy can also help explain why a transplanted kidney is not working properly. Talk with your health care provider about what information might be learned from the biopsy and the risks involved. This can help you make a decision about whether a biopsy is worthwhile in your case. LET [facility] CARE PROVIDER KNOW ABOUT:  Any allergies you have.  All medicines you are taking, including vitamins, herbs, eye drops, creams, and over-the-counter medicines.  Previous problems you or members of your family have had with the use of anesthetics.  Any blood disorders you have.  Previous surgeries you have had.  Medical conditions you have. RISKS AND COMPLICATIONS Generally, a kidney biopsy is a safe procedure. However, as with any procedure, complications can occur. Possible complications include:  Infection.  Bleeding. BEFORE THE PROCEDURE  Make sure you understand the need for a biopsy.  Do not eat or drink for 8 hours before the test or as directed by your health care provider.  You will need to give blood and urine samples before the biopsy. This is to make sure you do not have a condition where you should not have a biopsy. PROCEDURE Kidney biopsies are usually done in a hospital. During  the procedure, you may be fully awake with light sedation, or you may be asleep under general anesthesia. The entire procedure usually takes an hour.  You will lie on your stomach to position the kidneys near the surface of your back. If you have a transplanted kidney, you will lie on your back.  The health care provider will inject a local painkiller. For a through-the-skin (percutaneous) biopsy, the health care provider will use a locating needle and X-ray or ultrasound equipment to find the right spot.  A collecting needle will be used to gather the tissue. If you are awake, you will be asked to hold your breath as the needle is inserted and collects the tissue. Each insertion and collection lasts about 30 seconds or a little longer. You will be told when to exhale. AFTER THE PROCEDURE  You will lie on your back for 12 to 24 hours. If you have a transplanted kidney, you may not have to lie on your back. During this time, your back will probably feel sore. You may stay in the hospital overnight after the procedure so that staff can check your condition.  You may notice some blood in your urine for 24 hours after the test. To detect any problems, your health care providers will:  Monitor your blood pressure and pulse.  Take blood samples to measure the amount of red blood cells.  Examine the urine that you pass.  On rare occasions when bleeding is excessive, it may be necessary to replace lost blood with a transfusion.  It is your responsibility to obtain your test results.  Ask the lab or department performing the test when and how you will get your results. FOR MORE INFORMATION  [employer]: https://mathis.com/  National Kidney Foundation: www.kidney.org  National Kidney and Urologic Diseases Information Clearinghouse: http://kidney.AmenCredit.is Document Released: 02/28/2004 Document Revised: 02/07/2013 Document Reviewed: 10/23/2012 [facility] Patient Information 2015 [place],  [place]. This information is not intended to replace advice given to you by your health care provider. Make sure you discuss any questions you have with your health care provider.

## 2014-09-14 NOTE — Discharge Summary (Signed)
Physician Discharge Summary  Patient ID: [name] MRN: [id number] DOB/AGE: 01-11-1941 74 y.o.  Admit date: 09/13/2014 Discharge date: 09/14/2014  Discharge Diagnoses:  Active Problems:   Nephritic syndrome   AKI (acute kidney injury)   Discharged Condition: good  Hospital Course:   [name] is a 74 year old AA female with a 20-year history of hypertension, increasingly more difficult to control over the last year, who has developed severe edema over the last 1-2 months and whose creatinine has increased from 1.4 in 10/2013 to 3.8 - 4.3 more recently.  She has now developed nephrotic syndrome with recent 24-hour urine collection indicating 8.3 grams of protein.  Her edema and subsequently her blood pressure have improved on Lasix, but with her drastic change in GFR, she is high risk for ESRD, so she was admitted yesterday 09/13/14 for renal biopsy per Dr. Jeneen Rinks Deterding to determine diagnosis and treatment goals.  Today she has no complaints and is stable for discharge.  She will follow-up with Dr. Jimmy Footman in the office on June 14.  Treatments: analgesia: acetaminophen Blood pressure 160/72, pulse 62, temperature 98.3 F (36.8 C), temperature source Oral, resp. rate 17, height 5\' 9"  (1.753 m), weight 99.338 kg (219 lb), SpO2 98 %.  Disposition:   Discharge Instructions    Diet - low sodium heart healthy    Complete by:  As directed      Increase activity slowly    Complete by:  As directed             Medication List    TAKE these medications        furosemide 80 MG tablet  Commonly known as:  LASIX  Take 160 mg by mouth 2 (two) times daily.     nebivolol 10 MG tablet  Commonly known as:  BYSTOLIC  Take 10 mg by mouth daily.     potassium chloride SA 20 MEQ tablet  Commonly known as:  K-DUR,KLOR-CON  Take 40 mEq by mouth 2 (two) times daily. For 3 days         Signed: LYLES,CHARLES 09/14/2014, 8:42 AM   Patient seen and examined, agree with above  note with above modifications.  Hgb decreased from 9.4 to 7.7 but apparently feels well and no obvious hematoma- desires discharge- follow up with Dr. Jimmy Footman arranged   Corliss Parish, MD 09/14/2014

## 2014-09-24 ENCOUNTER — Encounter (HOSPITAL_COMMUNITY): Payer: Self-pay

## 2014-09-27 ENCOUNTER — Encounter (HOSPITAL_COMMUNITY): Payer: Self-pay

## 2015-01-02 ENCOUNTER — Other Ambulatory Visit (HOSPITAL_COMMUNITY): Payer: Self-pay | Admitting: *Deleted

## 2015-01-03 ENCOUNTER — Encounter (HOSPITAL_COMMUNITY)
Admission: RE | Admit: 2015-01-03 | Discharge: 2015-01-03 | Disposition: A | Discharge status: Home / Self Care | Payer: Medicare Other | Source: Ambulatory Visit | Attending: Nephrology | Admitting: Nephrology

## 2015-01-03 DIAGNOSIS — Z5181 Encounter for therapeutic drug level monitoring: Secondary | ICD-10-CM | POA: Diagnosis not present

## 2015-01-03 DIAGNOSIS — D509 Iron deficiency anemia, unspecified: Secondary | ICD-10-CM | POA: Diagnosis not present

## 2015-01-03 DIAGNOSIS — Z79899 Other long term (current) drug therapy: Secondary | ICD-10-CM | POA: Diagnosis not present

## 2015-01-03 DIAGNOSIS — D631 Anemia in chronic kidney disease: Secondary | ICD-10-CM | POA: Insufficient documentation

## 2015-01-03 DIAGNOSIS — N184 Chronic kidney disease, stage 4 (severe): Secondary | ICD-10-CM | POA: Diagnosis present

## 2015-01-03 LAB — POCT HEMOGLOBIN-HEMACUE: Hemoglobin: 8.8 g/dL — ABNORMAL LOW (ref 12.0–15.0)

## 2015-01-03 MED ORDER — SODIUM CHLORIDE 0.9 % IV SOLN
510.0000 mg | Freq: Once | INTRAVENOUS | Status: AC
  Administered 2015-01-03: 510 mg via INTRAVENOUS
  Filled 2015-01-03: qty 17

## 2015-01-03 MED ORDER — EPOETIN ALFA 20000 UNIT/ML IJ SOLN
INTRAMUSCULAR | Status: AC
  Administered 2015-01-03: 20000 [IU] via SUBCUTANEOUS
  Filled 2015-01-03: qty 1

## 2015-01-03 MED ORDER — EPOETIN ALFA 20000 UNIT/ML IJ SOLN
20000.0000 [IU] | INTRAMUSCULAR | Status: DC
  Administered 2015-01-03: 20000 [IU] via SUBCUTANEOUS

## 2015-01-03 NOTE — Discharge Instructions (Signed)
Epoetin Alfa injection °What is this medicine? °EPOETIN ALFA (e POE e tin AL fa) helps your body make more red blood cells. This medicine is used to treat anemia caused by chronic kidney failure, cancer chemotherapy, or HIV-therapy. It may also be used before surgery if you have anemia. °This medicine may be used for other purposes; ask your health care provider or pharmacist if you have questions. °COMMON BRAND NAME(S): Epogen, Procrit °What should I tell my health care provider before I take this medicine? °They need to know if you have any of these conditions: °-blood clotting disorders °-cancer patient not on chemotherapy °-cystic fibrosis °-heart disease, such as angina or heart failure °-hemoglobin level of 12 g/dL or greater °-high blood pressure °-low levels of folate, iron, or vitamin B12 °-seizures °-an unusual or allergic reaction to erythropoietin, albumin, benzyl alcohol, hamster proteins, other medicines, foods, dyes, or preservatives °-pregnant or trying to get pregnant °-breast-feeding °How should I use this medicine? °This medicine is for injection into a vein or under the skin. It is usually given by a health care professional in a hospital or clinic setting. °If you get this medicine at home, you will be taught how to prepare and give this medicine. Use exactly as directed. Take your medicine at regular intervals. Do not take your medicine more often than directed. °It is important that you put your used needles and syringes in a special sharps container. Do not put them in a trash can. If you do not have a sharps container, call your pharmacist or healthcare provider to get one. °Talk to your pediatrician regarding the use of this medicine in children. While this drug may be prescribed for selected conditions, precautions do apply. °Overdosage: If you think you have taken too much of this medicine contact a poison control center or emergency room at once. °NOTE: This medicine is only for you. Do  not share this medicine with others. °What if I miss a dose? °If you miss a dose, take it as soon as you can. If it is almost time for your next dose, take only that dose. Do not take double or extra doses. °What may interact with this medicine? °Do not take this medicine with any of the following medications: °-darbepoetin alfa °This list may not describe all possible interactions. Give your health care provider a list of all the medicines, herbs, non-prescription drugs, or dietary supplements you use. Also tell them if you smoke, drink alcohol, or use illegal drugs. Some items may interact with your medicine. °What should I watch for while using this medicine? °Visit your prescriber or health care professional for regular checks on your progress and for the needed blood tests and blood pressure measurements. It is especially important for the doctor to make sure your hemoglobin level is in the desired range, to limit the risk of potential side effects and to give you the best benefit. Keep all appointments for any recommended tests. Check your blood pressure as directed. Ask your doctor what your blood pressure should be and when you should contact him or her. °As your body makes more red blood cells, you may need to take iron, folic acid, or vitamin B supplements. Ask your doctor or health care provider which products are right for you. If you have kidney disease continue dietary restrictions, even though this medication can make you feel better. Talk with your doctor or health care professional about the foods you eat and the vitamins that you take. °What   side effects may I notice from receiving this medicine? °Side effects that you should report to your doctor or health care professional as soon as possible: °-allergic reactions like skin rash, itching or hives, swelling of the face, lips, or tongue °-breathing problems °-changes in vision °-chest pain °-confusion, trouble speaking or understanding °-feeling  faint or lightheaded, falls °-high blood pressure °-muscle aches or pains °-pain, swelling, warmth in the leg °-rapid weight gain °-severe headaches °-sudden numbness or weakness of the face, arm or leg °-trouble walking, dizziness, loss of balance or coordination °-seizures (convulsions) °-swelling of the ankles, feet, hands °-unusually weak or tired °Side effects that usually do not require medical attention (report to your doctor or health care professional if they continue or are bothersome): °-diarrhea °-fever, chills (flu-like symptoms) °-headaches °-nausea, vomiting °-redness, stinging, or swelling at site where injected °This list may not describe all possible side effects. Call your doctor for medical advice about side effects. You may report side effects to FDA at 1-800-FDA-1088. °Where should I keep my medicine? °Keep out of the reach of children. °Store in a refrigerator between 2 and 8 degrees C (36 and 46 degrees F). Do not freeze or shake. Throw away any unused portion if using a single-dose vial. Multi-dose vials can be kept in the refrigerator for up to 21 days after the initial dose. Throw away unused medicine. °NOTE: This sheet is a summary. It may not cover all possible information. If you have questions about this medicine, talk to your doctor, pharmacist, or health care provider. °© 2015, Elsevier/Gold Standard. (2008-04-02 10:25:44) °Ferumoxytol injection °What is this medicine? °FERUMOXYTOL is an iron complex. Iron is used to make healthy red blood cells, which carry oxygen and nutrients throughout the body. This medicine is used to treat iron deficiency anemia in people with chronic kidney disease. °This medicine may be used for other purposes; ask your health care provider or pharmacist if you have questions. °COMMON BRAND NAME(S): Feraheme °What should I tell my health care provider before I take this medicine? °They need to know if you have any of these conditions: °-anemia not caused by  low iron levels °-high levels of iron in the blood °-magnetic resonance imaging (MRI) test scheduled °-an unusual or allergic reaction to iron, other medicines, foods, dyes, or preservatives °-pregnant or trying to get pregnant °-breast-feeding °How should I use this medicine? °This medicine is for injection into a vein. It is given by a health care professional in a hospital or clinic setting. °Talk to your pediatrician regarding the use of this medicine in children. Special care may be needed. °Overdosage: If you think you've taken too much of this medicine contact a poison control center or emergency room at once. °Overdosage: If you think you have taken too much of this medicine contact a poison control center or emergency room at once. °NOTE: This medicine is only for you. Do not share this medicine with others. °What if I miss a dose? °It is important not to miss your dose. Call your doctor or health care professional if you are unable to keep an appointment. °What may interact with this medicine? °This medicine may interact with the following medications: °-other iron products °This list may not describe all possible interactions. Give your health care provider a list of all the medicines, herbs, non-prescription drugs, or dietary supplements you use. Also tell them if you smoke, drink alcohol, or use illegal drugs. Some items may interact with your medicine. °What should I   watch for while using this medicine? °Visit your doctor or healthcare professional regularly. Tell your doctor or healthcare professional if your symptoms do not start to get better or if they get worse. You may need blood work done while you are taking this medicine. °You may need to follow a special diet. Talk to your doctor. Foods that contain iron include: whole grains/cereals, dried fruits, beans, or peas, leafy green vegetables, and organ meats (liver, kidney). °What side effects may I notice from receiving this medicine? °Side  effects that you should report to your doctor or health care professional as soon as possible: °-allergic reactions like skin rash, itching or hives, swelling of the face, lips, or tongue °-breathing problems °-changes in blood pressure °-feeling faint or lightheaded, falls °-fever or chills °-flushing, sweating, or hot feelings °-swelling of the ankles or feet °Side effects that usually do not require medical attention (Report these to your doctor or health care professional if they continue or are bothersome.): °-diarrhea °-headache °-nausea, vomiting °-stomach pain °This list may not describe all possible side effects. Call your doctor for medical advice about side effects. You may report side effects to FDA at 1-800-FDA-1088. °Where should I keep my medicine? °This drug is given in a hospital or clinic and will not be stored at home. °NOTE: This sheet is a summary. It may not cover all possible information. If you have questions about this medicine, talk to your doctor, pharmacist, or health care provider. °© 2015, Elsevier/Gold Standard. (2011-12-03 15:23:36) ° ° °

## 2015-01-17 ENCOUNTER — Encounter (HOSPITAL_COMMUNITY)
Admission: RE | Admit: 2015-01-17 | Discharge: 2015-01-17 | Disposition: A | Discharge status: Home / Self Care | Payer: Medicare Other | Source: Ambulatory Visit | Attending: Nephrology | Admitting: Nephrology

## 2015-01-17 DIAGNOSIS — N184 Chronic kidney disease, stage 4 (severe): Secondary | ICD-10-CM | POA: Diagnosis not present

## 2015-01-17 LAB — POCT HEMOGLOBIN-HEMACUE: Hemoglobin: 9.8 g/dL — ABNORMAL LOW (ref 12.0–15.0)

## 2015-01-17 MED ORDER — EPOETIN ALFA 20000 UNIT/ML IJ SOLN
20000.0000 [IU] | INTRAMUSCULAR | Status: DC
  Administered 2015-01-17: 20000 [IU] via SUBCUTANEOUS

## 2015-01-31 ENCOUNTER — Encounter (HOSPITAL_COMMUNITY)
Admission: RE | Admit: 2015-01-31 | Discharge: 2015-01-31 | Disposition: A | Discharge status: Home / Self Care | Payer: Medicare Other | Source: Ambulatory Visit | Attending: Nephrology | Admitting: Nephrology

## 2015-01-31 DIAGNOSIS — N184 Chronic kidney disease, stage 4 (severe): Secondary | ICD-10-CM | POA: Diagnosis not present

## 2015-01-31 LAB — POCT HEMOGLOBIN-HEMACUE: Hemoglobin: 10.8 g/dL — ABNORMAL LOW (ref 12.0–15.0)

## 2015-01-31 LAB — FERRITIN: Ferritin: 514 ng/mL — ABNORMAL HIGH (ref 11–307)

## 2015-01-31 LAB — IRON AND TIBC
Iron: 136 ug/dL (ref 28–170)
SATURATION RATIOS: 44 % — AB (ref 10.4–31.8)
TIBC: 309 ug/dL (ref 250–450)
UIBC: 173 ug/dL

## 2015-01-31 MED ORDER — EPOETIN ALFA 20000 UNIT/ML IJ SOLN
20000.0000 [IU] | INTRAMUSCULAR | Status: DC
  Administered 2015-01-31: 20000 [IU] via SUBCUTANEOUS

## 2015-01-31 MED ORDER — EPOETIN ALFA 20000 UNIT/ML IJ SOLN
INTRAMUSCULAR | Status: AC
  Filled 2015-01-31: qty 1

## 2015-02-14 ENCOUNTER — Encounter (HOSPITAL_COMMUNITY)
Admission: RE | Admit: 2015-02-14 | Discharge: 2015-02-14 | Disposition: A | Discharge status: Home / Self Care | Payer: Medicare Other | Source: Ambulatory Visit | Attending: Nephrology | Admitting: Nephrology

## 2015-02-14 DIAGNOSIS — N184 Chronic kidney disease, stage 4 (severe): Secondary | ICD-10-CM | POA: Insufficient documentation

## 2015-02-14 DIAGNOSIS — Z79899 Other long term (current) drug therapy: Secondary | ICD-10-CM | POA: Diagnosis not present

## 2015-02-14 DIAGNOSIS — D631 Anemia in chronic kidney disease: Secondary | ICD-10-CM | POA: Insufficient documentation

## 2015-02-14 DIAGNOSIS — Z5181 Encounter for therapeutic drug level monitoring: Secondary | ICD-10-CM | POA: Diagnosis not present

## 2015-02-14 DIAGNOSIS — D509 Iron deficiency anemia, unspecified: Secondary | ICD-10-CM | POA: Diagnosis not present

## 2015-02-14 LAB — POCT HEMOGLOBIN-HEMACUE: Hemoglobin: 11.2 g/dL — ABNORMAL LOW (ref 12.0–15.0)

## 2015-02-14 MED ORDER — EPOETIN ALFA 20000 UNIT/ML IJ SOLN
20000.0000 [IU] | INTRAMUSCULAR | Status: DC
Start: 2015-02-14 — End: 2015-02-15
  Administered 2015-02-14: 20000 [IU] via SUBCUTANEOUS

## 2015-02-14 MED ORDER — EPOETIN ALFA 20000 UNIT/ML IJ SOLN
INTRAMUSCULAR | Status: AC
  Administered 2015-02-14: 20000 [IU] via SUBCUTANEOUS
  Filled 2015-02-14: qty 1

## 2015-02-28 ENCOUNTER — Encounter (HOSPITAL_COMMUNITY)
Admission: RE | Admit: 2015-02-28 | Discharge: 2015-02-28 | Disposition: A | Discharge status: Home / Self Care | Payer: Medicare Other | Source: Ambulatory Visit | Attending: Nephrology | Admitting: Nephrology

## 2015-02-28 DIAGNOSIS — N184 Chronic kidney disease, stage 4 (severe): Secondary | ICD-10-CM | POA: Diagnosis not present

## 2015-02-28 LAB — POCT HEMOGLOBIN-HEMACUE: HEMOGLOBIN: 10.9 g/dL — AB (ref 12.0–15.0)

## 2015-02-28 MED ORDER — EPOETIN ALFA 20000 UNIT/ML IJ SOLN
INTRAMUSCULAR | Status: AC
  Filled 2015-02-28: qty 1

## 2015-02-28 MED ORDER — EPOETIN ALFA 20000 UNIT/ML IJ SOLN
20000.0000 [IU] | INTRAMUSCULAR | Status: DC
  Administered 2015-02-28: 20000 [IU] via SUBCUTANEOUS

## 2015-03-14 ENCOUNTER — Encounter (HOSPITAL_COMMUNITY)
Admission: RE | Admit: 2015-03-14 | Discharge: 2015-03-14 | Disposition: A | Discharge status: Home / Self Care | Payer: 59 | Source: Ambulatory Visit | Attending: Nephrology | Admitting: Nephrology

## 2015-03-14 DIAGNOSIS — D509 Iron deficiency anemia, unspecified: Secondary | ICD-10-CM | POA: Insufficient documentation

## 2015-03-14 DIAGNOSIS — N184 Chronic kidney disease, stage 4 (severe): Secondary | ICD-10-CM | POA: Diagnosis not present

## 2015-03-14 DIAGNOSIS — Z5181 Encounter for therapeutic drug level monitoring: Secondary | ICD-10-CM | POA: Diagnosis not present

## 2015-03-14 DIAGNOSIS — D631 Anemia in chronic kidney disease: Secondary | ICD-10-CM | POA: Diagnosis not present

## 2015-03-14 DIAGNOSIS — Z79899 Other long term (current) drug therapy: Secondary | ICD-10-CM | POA: Insufficient documentation

## 2015-03-14 LAB — IRON AND TIBC
IRON: 134 ug/dL (ref 28–170)
Saturation Ratios: 45 % — ABNORMAL HIGH (ref 10.4–31.8)
TIBC: 298 ug/dL (ref 250–450)
UIBC: 164 ug/dL

## 2015-03-14 LAB — POCT HEMOGLOBIN-HEMACUE: HEMOGLOBIN: 12.5 g/dL (ref 12.0–15.0)

## 2015-03-14 LAB — FERRITIN: Ferritin: 457 ng/mL — ABNORMAL HIGH (ref 11–307)

## 2015-03-14 MED ORDER — EPOETIN ALFA 20000 UNIT/ML IJ SOLN
20000.0000 [IU] | INTRAMUSCULAR | Status: DC
Start: 2015-03-14 — End: 2015-03-15

## 2015-03-28 ENCOUNTER — Encounter (HOSPITAL_COMMUNITY)
Admission: RE | Admit: 2015-03-28 | Discharge: 2015-03-28 | Disposition: A | Discharge status: Home / Self Care | Payer: 59 | Source: Ambulatory Visit | Attending: Nephrology | Admitting: Nephrology

## 2015-03-28 DIAGNOSIS — N184 Chronic kidney disease, stage 4 (severe): Secondary | ICD-10-CM | POA: Diagnosis not present

## 2015-03-28 MED ORDER — EPOETIN ALFA 20000 UNIT/ML IJ SOLN
20000.0000 [IU] | INTRAMUSCULAR | Status: DC
Start: 2015-03-28 — End: 2015-03-29
  Administered 2015-03-28: 20000 [IU] via SUBCUTANEOUS

## 2015-03-28 MED ORDER — EPOETIN ALFA 20000 UNIT/ML IJ SOLN
INTRAMUSCULAR | Status: AC
  Filled 2015-03-28: qty 1

## 2015-03-31 LAB — POCT HEMOGLOBIN-HEMACUE: Hemoglobin: 11.7 g/dL — ABNORMAL LOW (ref 12.0–15.0)

## 2015-04-11 ENCOUNTER — Encounter (HOSPITAL_COMMUNITY)
Admission: RE | Admit: 2015-04-11 | Discharge: 2015-04-11 | Disposition: A | Discharge status: Home / Self Care | Payer: Medicare Other | Source: Ambulatory Visit | Attending: Nephrology | Admitting: Nephrology

## 2015-04-11 DIAGNOSIS — Z5181 Encounter for therapeutic drug level monitoring: Secondary | ICD-10-CM | POA: Diagnosis not present

## 2015-04-11 DIAGNOSIS — Z79899 Other long term (current) drug therapy: Secondary | ICD-10-CM | POA: Diagnosis not present

## 2015-04-11 DIAGNOSIS — D631 Anemia in chronic kidney disease: Secondary | ICD-10-CM | POA: Diagnosis not present

## 2015-04-11 DIAGNOSIS — D509 Iron deficiency anemia, unspecified: Secondary | ICD-10-CM | POA: Diagnosis not present

## 2015-04-11 DIAGNOSIS — N184 Chronic kidney disease, stage 4 (severe): Secondary | ICD-10-CM | POA: Insufficient documentation

## 2015-04-11 LAB — FERRITIN: Ferritin: 489 ng/mL — ABNORMAL HIGH (ref 11–307)

## 2015-04-11 LAB — POCT HEMOGLOBIN-HEMACUE: HEMOGLOBIN: 11.5 g/dL — AB (ref 12.0–15.0)

## 2015-04-11 LAB — IRON AND TIBC
Iron: 123 ug/dL (ref 28–170)
SATURATION RATIOS: 41 % — AB (ref 10.4–31.8)
TIBC: 298 ug/dL (ref 250–450)
UIBC: 175 ug/dL

## 2015-04-11 MED ORDER — EPOETIN ALFA 20000 UNIT/ML IJ SOLN
INTRAMUSCULAR | Status: AC
Start: 2015-04-11 — End: 2015-04-11
  Filled 2015-04-11: qty 1

## 2015-04-11 MED ORDER — EPOETIN ALFA 20000 UNIT/ML IJ SOLN
20000.0000 [IU] | INTRAMUSCULAR | Status: DC
  Administered 2015-04-11: 20000 [IU] via SUBCUTANEOUS

## 2015-04-25 ENCOUNTER — Encounter (HOSPITAL_COMMUNITY)
Admission: RE | Admit: 2015-04-25 | Discharge: 2015-04-25 | Disposition: A | Discharge status: Home / Self Care | Payer: Medicare Other | Source: Ambulatory Visit | Attending: Nephrology | Admitting: Nephrology

## 2015-04-25 DIAGNOSIS — N184 Chronic kidney disease, stage 4 (severe): Secondary | ICD-10-CM | POA: Diagnosis not present

## 2015-04-25 MED ORDER — EPOETIN ALFA 20000 UNIT/ML IJ SOLN: 20000.0000 [IU] | INTRAMUSCULAR | Status: DC

## 2015-04-29 LAB — POCT HEMOGLOBIN-HEMACUE: HEMOGLOBIN: 12 g/dL (ref 12.0–15.0)

## 2015-05-09 ENCOUNTER — Encounter (HOSPITAL_COMMUNITY)
Admission: RE | Admit: 2015-05-09 | Discharge: 2015-05-09 | Disposition: A | Discharge status: Home / Self Care | Payer: Medicare Other | Source: Ambulatory Visit | Attending: Nephrology | Admitting: Nephrology

## 2015-05-09 DIAGNOSIS — Z5181 Encounter for therapeutic drug level monitoring: Secondary | ICD-10-CM | POA: Insufficient documentation

## 2015-05-09 DIAGNOSIS — D509 Iron deficiency anemia, unspecified: Secondary | ICD-10-CM | POA: Diagnosis not present

## 2015-05-09 DIAGNOSIS — Z79899 Other long term (current) drug therapy: Secondary | ICD-10-CM | POA: Insufficient documentation

## 2015-05-09 DIAGNOSIS — N184 Chronic kidney disease, stage 4 (severe): Secondary | ICD-10-CM | POA: Insufficient documentation

## 2015-05-09 DIAGNOSIS — D631 Anemia in chronic kidney disease: Secondary | ICD-10-CM | POA: Insufficient documentation

## 2015-05-09 LAB — IRON AND TIBC
IRON: 148 ug/dL (ref 28–170)
Saturation Ratios: 49 % — ABNORMAL HIGH (ref 10.4–31.8)
TIBC: 301 ug/dL (ref 250–450)
UIBC: 153 ug/dL

## 2015-05-09 LAB — FERRITIN: Ferritin: 565 ng/mL — ABNORMAL HIGH (ref 11–307)

## 2015-05-09 MED ORDER — EPOETIN ALFA 20000 UNIT/ML IJ SOLN
20000.0000 [IU] | INTRAMUSCULAR | Status: DC
  Administered 2015-05-09: 20000 [IU] via SUBCUTANEOUS

## 2015-05-09 MED ORDER — EPOETIN ALFA 20000 UNIT/ML IJ SOLN
INTRAMUSCULAR | Status: AC
Start: 2015-05-09 — End: 2015-05-09
  Filled 2015-05-09: qty 1

## 2015-05-12 LAB — POCT HEMOGLOBIN-HEMACUE: HEMOGLOBIN: 11.7 g/dL — AB (ref 12.0–15.0)

## 2015-05-23 ENCOUNTER — Encounter (HOSPITAL_COMMUNITY)
Admission: RE | Admit: 2015-05-23 | Discharge: 2015-05-23 | Disposition: A | Discharge status: Home / Self Care | Payer: Medicare Other | Source: Ambulatory Visit | Attending: Nephrology | Admitting: Nephrology

## 2015-05-23 DIAGNOSIS — N184 Chronic kidney disease, stage 4 (severe): Secondary | ICD-10-CM | POA: Diagnosis not present

## 2015-05-23 LAB — POCT HEMOGLOBIN-HEMACUE: Hemoglobin: 10.6 g/dL — ABNORMAL LOW (ref 12.0–15.0)

## 2015-05-23 MED ORDER — EPOETIN ALFA 20000 UNIT/ML IJ SOLN
INTRAMUSCULAR | Status: AC
Start: 2015-05-23 — End: 2015-05-23
  Filled 2015-05-23: qty 1

## 2015-05-23 MED ORDER — EPOETIN ALFA 20000 UNIT/ML IJ SOLN
20000.0000 [IU] | INTRAMUSCULAR | Status: DC
  Administered 2015-05-23: 20000 [IU] via SUBCUTANEOUS

## 2015-06-06 ENCOUNTER — Encounter (HOSPITAL_COMMUNITY)
Admission: RE | Admit: 2015-06-06 | Discharge: 2015-06-06 | Disposition: A | Discharge status: Home / Self Care | Payer: Medicare Other | Source: Ambulatory Visit | Attending: Nephrology | Admitting: Nephrology

## 2015-06-06 DIAGNOSIS — Z79899 Other long term (current) drug therapy: Secondary | ICD-10-CM | POA: Diagnosis not present

## 2015-06-06 DIAGNOSIS — D631 Anemia in chronic kidney disease: Secondary | ICD-10-CM | POA: Diagnosis not present

## 2015-06-06 DIAGNOSIS — D509 Iron deficiency anemia, unspecified: Secondary | ICD-10-CM | POA: Diagnosis not present

## 2015-06-06 DIAGNOSIS — Z5181 Encounter for therapeutic drug level monitoring: Secondary | ICD-10-CM | POA: Insufficient documentation

## 2015-06-06 DIAGNOSIS — N184 Chronic kidney disease, stage 4 (severe): Secondary | ICD-10-CM | POA: Diagnosis not present

## 2015-06-06 LAB — IRON AND TIBC
IRON: 102 ug/dL (ref 28–170)
Saturation Ratios: 35 % — ABNORMAL HIGH (ref 10.4–31.8)
TIBC: 291 ug/dL (ref 250–450)
UIBC: 189 ug/dL

## 2015-06-06 LAB — FERRITIN: FERRITIN: 532 ng/mL — AB (ref 11–307)

## 2015-06-06 MED ORDER — EPOETIN ALFA 20000 UNIT/ML IJ SOLN
INTRAMUSCULAR | Status: AC
  Filled 2015-06-06: qty 1

## 2015-06-06 MED ORDER — EPOETIN ALFA 20000 UNIT/ML IJ SOLN
20000.0000 [IU] | INTRAMUSCULAR | Status: DC
  Administered 2015-06-06: 20000 [IU] via SUBCUTANEOUS

## 2015-06-09 LAB — POCT HEMOGLOBIN-HEMACUE: HEMOGLOBIN: 11.5 g/dL — AB (ref 12.0–15.0)

## 2015-06-13 ENCOUNTER — Encounter (HOSPITAL_COMMUNITY): Payer: PRIVATE HEALTH INSURANCE

## 2015-06-20 ENCOUNTER — Encounter (HOSPITAL_COMMUNITY)
Admission: RE | Admit: 2015-06-20 | Discharge: 2015-06-20 | Disposition: A | Discharge status: Home / Self Care | Payer: Medicare Other | Source: Ambulatory Visit | Attending: Nephrology | Admitting: Nephrology

## 2015-06-20 DIAGNOSIS — N184 Chronic kidney disease, stage 4 (severe): Secondary | ICD-10-CM | POA: Diagnosis not present

## 2015-06-20 LAB — POCT HEMOGLOBIN-HEMACUE: Hemoglobin: 11.1 g/dL — ABNORMAL LOW (ref 12.0–15.0)

## 2015-06-20 MED ORDER — EPOETIN ALFA 20000 UNIT/ML IJ SOLN
INTRAMUSCULAR | Status: AC
Start: 2015-06-20 — End: 2015-06-20
  Administered 2015-06-20: 20000 [IU]
  Filled 2015-06-20: qty 1

## 2015-06-20 MED ORDER — EPOETIN ALFA 20000 UNIT/ML IJ SOLN: 20000.0000 [IU] | INTRAMUSCULAR | Status: DC

## 2015-07-03 ENCOUNTER — Other Ambulatory Visit (HOSPITAL_COMMUNITY): Payer: Self-pay | Admitting: *Deleted

## 2015-07-04 ENCOUNTER — Encounter (HOSPITAL_COMMUNITY)
Admission: RE | Admit: 2015-07-04 | Discharge: 2015-07-04 | Disposition: A | Discharge status: Home / Self Care | Payer: Medicare Other | Source: Ambulatory Visit | Attending: Nephrology | Admitting: Nephrology

## 2015-07-04 DIAGNOSIS — D509 Iron deficiency anemia, unspecified: Secondary | ICD-10-CM | POA: Diagnosis not present

## 2015-07-04 DIAGNOSIS — N184 Chronic kidney disease, stage 4 (severe): Secondary | ICD-10-CM | POA: Insufficient documentation

## 2015-07-04 DIAGNOSIS — D631 Anemia in chronic kidney disease: Secondary | ICD-10-CM | POA: Insufficient documentation

## 2015-07-04 DIAGNOSIS — Z79899 Other long term (current) drug therapy: Secondary | ICD-10-CM | POA: Diagnosis not present

## 2015-07-04 DIAGNOSIS — Z5181 Encounter for therapeutic drug level monitoring: Secondary | ICD-10-CM | POA: Insufficient documentation

## 2015-07-04 LAB — IRON AND TIBC
IRON: 100 ug/dL (ref 28–170)
SATURATION RATIOS: 36 % — AB (ref 10.4–31.8)
TIBC: 280 ug/dL (ref 250–450)
UIBC: 180 ug/dL

## 2015-07-04 LAB — POCT HEMOGLOBIN-HEMACUE: Hemoglobin: 11.7 g/dL — ABNORMAL LOW (ref 12.0–15.0)

## 2015-07-04 LAB — FERRITIN: Ferritin: 584 ng/mL — ABNORMAL HIGH (ref 11–307)

## 2015-07-04 MED ORDER — EPOETIN ALFA 20000 UNIT/ML IJ SOLN
INTRAMUSCULAR | Status: AC
  Administered 2015-07-04: 20000 [IU] via SUBCUTANEOUS
  Filled 2015-07-04: qty 1

## 2015-07-04 MED ORDER — EPOETIN ALFA 20000 UNIT/ML IJ SOLN
20000.0000 [IU] | INTRAMUSCULAR | Status: DC
Start: 2015-07-04 — End: 2015-07-05
  Administered 2015-07-04: 20000 [IU] via SUBCUTANEOUS

## 2015-07-18 ENCOUNTER — Encounter (HOSPITAL_COMMUNITY)
Admission: RE | Admit: 2015-07-18 | Discharge: 2015-07-18 | Disposition: A | Discharge status: Home / Self Care | Payer: Medicare Other | Source: Ambulatory Visit | Attending: Nephrology | Admitting: Nephrology

## 2015-07-18 DIAGNOSIS — N184 Chronic kidney disease, stage 4 (severe): Secondary | ICD-10-CM | POA: Diagnosis not present

## 2015-07-18 LAB — POCT HEMOGLOBIN-HEMACUE: HEMOGLOBIN: 11.1 g/dL — AB (ref 12.0–15.0)

## 2015-07-18 MED ORDER — EPOETIN ALFA 20000 UNIT/ML IJ SOLN
20000.0000 [IU] | INTRAMUSCULAR | Status: DC
  Administered 2015-07-18: 20000 [IU] via SUBCUTANEOUS

## 2015-07-18 MED ORDER — EPOETIN ALFA 20000 UNIT/ML IJ SOLN
INTRAMUSCULAR | Status: AC
  Filled 2015-07-18: qty 1

## 2015-08-01 ENCOUNTER — Encounter (HOSPITAL_COMMUNITY)
Admission: RE | Admit: 2015-08-01 | Discharge: 2015-08-01 | Disposition: A | Discharge status: Home / Self Care | Payer: Medicare Other | Source: Ambulatory Visit | Attending: Nephrology | Admitting: Nephrology

## 2015-08-01 DIAGNOSIS — N184 Chronic kidney disease, stage 4 (severe): Secondary | ICD-10-CM | POA: Diagnosis not present

## 2015-08-01 LAB — POCT HEMOGLOBIN-HEMACUE: Hemoglobin: 11.8 g/dL — ABNORMAL LOW (ref 12.0–15.0)

## 2015-08-01 MED ORDER — EPOETIN ALFA 20000 UNIT/ML IJ SOLN
INTRAMUSCULAR | Status: AC
  Filled 2015-08-01: qty 1

## 2015-08-01 MED ORDER — EPOETIN ALFA 20000 UNIT/ML IJ SOLN
20000.0000 [IU] | INTRAMUSCULAR | Status: DC
  Administered 2015-08-01: 20000 [IU] via SUBCUTANEOUS

## 2015-08-15 ENCOUNTER — Encounter (HOSPITAL_COMMUNITY)
Admission: RE | Admit: 2015-08-15 | Discharge: 2015-08-15 | Disposition: A | Discharge status: Home / Self Care | Payer: Medicare Other | Source: Ambulatory Visit | Attending: Nephrology | Admitting: Nephrology

## 2015-08-15 DIAGNOSIS — D631 Anemia in chronic kidney disease: Secondary | ICD-10-CM | POA: Insufficient documentation

## 2015-08-15 DIAGNOSIS — N184 Chronic kidney disease, stage 4 (severe): Secondary | ICD-10-CM | POA: Diagnosis present

## 2015-08-15 DIAGNOSIS — Z5181 Encounter for therapeutic drug level monitoring: Secondary | ICD-10-CM | POA: Diagnosis not present

## 2015-08-15 DIAGNOSIS — Z79899 Other long term (current) drug therapy: Secondary | ICD-10-CM | POA: Insufficient documentation

## 2015-08-15 DIAGNOSIS — D509 Iron deficiency anemia, unspecified: Secondary | ICD-10-CM | POA: Diagnosis not present

## 2015-08-15 LAB — IRON AND TIBC
IRON: 90 ug/dL (ref 28–170)
SATURATION RATIOS: 32 % — AB (ref 10.4–31.8)
TIBC: 281 ug/dL (ref 250–450)
UIBC: 191 ug/dL

## 2015-08-15 LAB — POCT HEMOGLOBIN-HEMACUE: Hemoglobin: 12 g/dL (ref 12.0–15.0)

## 2015-08-15 LAB — FERRITIN: FERRITIN: 459 ng/mL — AB (ref 11–307)

## 2015-08-15 MED ORDER — EPOETIN ALFA 20000 UNIT/ML IJ SOLN: 20000.0000 [IU] | INTRAMUSCULAR | Status: DC

## 2015-08-19 ENCOUNTER — Other Ambulatory Visit: Payer: Self-pay | Admitting: *Deleted

## 2015-08-19 DIAGNOSIS — Z01810 Encounter for preprocedural cardiovascular examination: Secondary | ICD-10-CM

## 2015-08-19 DIAGNOSIS — N184 Chronic kidney disease, stage 4 (severe): Secondary | ICD-10-CM

## 2015-08-29 ENCOUNTER — Encounter (HOSPITAL_COMMUNITY)
Admission: RE | Admit: 2015-08-29 | Discharge: 2015-08-29 | Disposition: A | Discharge status: Home / Self Care | Payer: Medicare Other | Source: Ambulatory Visit | Attending: Nephrology | Admitting: Nephrology

## 2015-08-29 DIAGNOSIS — N184 Chronic kidney disease, stage 4 (severe): Secondary | ICD-10-CM | POA: Diagnosis not present

## 2015-08-29 LAB — POCT HEMOGLOBIN-HEMACUE: HEMOGLOBIN: 10.7 g/dL — AB (ref 12.0–15.0)

## 2015-08-29 MED ORDER — EPOETIN ALFA 20000 UNIT/ML IJ SOLN
INTRAMUSCULAR | Status: AC
  Filled 2015-08-29: qty 1

## 2015-08-29 MED ORDER — EPOETIN ALFA 20000 UNIT/ML IJ SOLN
20000.0000 [IU] | INTRAMUSCULAR | Status: DC
  Administered 2015-08-29: 20000 [IU] via SUBCUTANEOUS

## 2015-09-03 ENCOUNTER — Encounter (HOSPITAL_COMMUNITY): Payer: PRIVATE HEALTH INSURANCE

## 2015-09-03 ENCOUNTER — Ambulatory Visit: Payer: PRIVATE HEALTH INSURANCE | Admitting: Vascular Surgery

## 2015-09-03 ENCOUNTER — Other Ambulatory Visit (HOSPITAL_COMMUNITY): Payer: PRIVATE HEALTH INSURANCE

## 2015-09-12 ENCOUNTER — Encounter (HOSPITAL_COMMUNITY)
Admission: RE | Admit: 2015-09-12 | Discharge: 2015-09-12 | Disposition: A | Discharge status: Home / Self Care | Payer: Medicare Other | Source: Ambulatory Visit | Attending: Nephrology | Admitting: Nephrology

## 2015-09-12 DIAGNOSIS — D509 Iron deficiency anemia, unspecified: Secondary | ICD-10-CM | POA: Insufficient documentation

## 2015-09-12 DIAGNOSIS — N184 Chronic kidney disease, stage 4 (severe): Secondary | ICD-10-CM | POA: Diagnosis present

## 2015-09-12 DIAGNOSIS — Z79899 Other long term (current) drug therapy: Secondary | ICD-10-CM | POA: Insufficient documentation

## 2015-09-12 DIAGNOSIS — Z5181 Encounter for therapeutic drug level monitoring: Secondary | ICD-10-CM | POA: Insufficient documentation

## 2015-09-12 DIAGNOSIS — D631 Anemia in chronic kidney disease: Secondary | ICD-10-CM | POA: Insufficient documentation

## 2015-09-12 LAB — POCT HEMOGLOBIN-HEMACUE: HEMOGLOBIN: 11 g/dL — AB (ref 12.0–15.0)

## 2015-09-12 LAB — IRON AND TIBC
Iron: 99 ug/dL (ref 28–170)
SATURATION RATIOS: 37 % — AB (ref 10.4–31.8)
TIBC: 270 ug/dL (ref 250–450)
UIBC: 171 ug/dL

## 2015-09-12 LAB — FERRITIN: FERRITIN: 455 ng/mL — AB (ref 11–307)

## 2015-09-12 MED ORDER — EPOETIN ALFA 20000 UNIT/ML IJ SOLN
INTRAMUSCULAR | Status: AC
  Filled 2015-09-12: qty 1

## 2015-09-12 MED ORDER — EPOETIN ALFA 20000 UNIT/ML IJ SOLN
20000.0000 [IU] | INTRAMUSCULAR | Status: DC
  Administered 2015-09-12: 20000 [IU] via SUBCUTANEOUS

## 2015-09-26 ENCOUNTER — Encounter (HOSPITAL_COMMUNITY)
Admission: RE | Admit: 2015-09-26 | Discharge: 2015-09-26 | Disposition: A | Discharge status: Home / Self Care | Payer: Medicare Other | Source: Ambulatory Visit | Attending: Nephrology | Admitting: Nephrology

## 2015-09-26 DIAGNOSIS — N184 Chronic kidney disease, stage 4 (severe): Secondary | ICD-10-CM | POA: Diagnosis not present

## 2015-09-26 LAB — POCT HEMOGLOBIN-HEMACUE: HEMOGLOBIN: 10.9 g/dL — AB (ref 12.0–15.0)

## 2015-09-26 MED ORDER — EPOETIN ALFA 20000 UNIT/ML IJ SOLN
INTRAMUSCULAR | Status: AC
  Filled 2015-09-26: qty 1

## 2015-09-26 MED ORDER — EPOETIN ALFA 20000 UNIT/ML IJ SOLN
20000.0000 [IU] | INTRAMUSCULAR | Status: DC
  Administered 2015-09-26: 20000 [IU] via SUBCUTANEOUS

## 2015-10-10 ENCOUNTER — Encounter (HOSPITAL_COMMUNITY)
Admission: RE | Admit: 2015-10-10 | Discharge: 2015-10-10 | Disposition: A | Discharge status: Home / Self Care | Payer: Medicare Other | Source: Ambulatory Visit | Attending: Nephrology | Admitting: Nephrology

## 2015-10-10 DIAGNOSIS — Z79899 Other long term (current) drug therapy: Secondary | ICD-10-CM | POA: Diagnosis not present

## 2015-10-10 DIAGNOSIS — D509 Iron deficiency anemia, unspecified: Secondary | ICD-10-CM | POA: Insufficient documentation

## 2015-10-10 DIAGNOSIS — N184 Chronic kidney disease, stage 4 (severe): Secondary | ICD-10-CM | POA: Diagnosis present

## 2015-10-10 DIAGNOSIS — Z5181 Encounter for therapeutic drug level monitoring: Secondary | ICD-10-CM | POA: Diagnosis not present

## 2015-10-10 DIAGNOSIS — D631 Anemia in chronic kidney disease: Secondary | ICD-10-CM | POA: Insufficient documentation

## 2015-10-10 LAB — FERRITIN: Ferritin: 613 ng/mL — ABNORMAL HIGH (ref 11–307)

## 2015-10-10 LAB — POCT HEMOGLOBIN-HEMACUE: Hemoglobin: 11.4 g/dL — ABNORMAL LOW (ref 12.0–15.0)

## 2015-10-10 LAB — IRON AND TIBC
IRON: 112 ug/dL (ref 28–170)
Saturation Ratios: 39 % — ABNORMAL HIGH (ref 10.4–31.8)
TIBC: 286 ug/dL (ref 250–450)
UIBC: 174 ug/dL

## 2015-10-10 MED ORDER — EPOETIN ALFA 20000 UNIT/ML IJ SOLN
20000.0000 [IU] | INTRAMUSCULAR | Status: DC
  Administered 2015-10-10: 20000 [IU] via SUBCUTANEOUS

## 2015-10-10 MED ORDER — EPOETIN ALFA 20000 UNIT/ML IJ SOLN
INTRAMUSCULAR | Status: AC
  Administered 2015-10-10: 20000 [IU] via SUBCUTANEOUS
  Filled 2015-10-10: qty 1

## 2015-10-24 ENCOUNTER — Encounter (HOSPITAL_COMMUNITY): Payer: PRIVATE HEALTH INSURANCE

## 2015-11-07 ENCOUNTER — Encounter (HOSPITAL_COMMUNITY)
Admission: RE | Admit: 2015-11-07 | Discharge: 2015-11-07 | Disposition: A | Discharge status: Home / Self Care | Payer: Medicare Other | Source: Ambulatory Visit | Attending: Nephrology | Admitting: Nephrology

## 2015-11-07 DIAGNOSIS — Z5181 Encounter for therapeutic drug level monitoring: Secondary | ICD-10-CM | POA: Diagnosis not present

## 2015-11-07 DIAGNOSIS — D509 Iron deficiency anemia, unspecified: Secondary | ICD-10-CM | POA: Diagnosis not present

## 2015-11-07 DIAGNOSIS — D631 Anemia in chronic kidney disease: Secondary | ICD-10-CM | POA: Diagnosis not present

## 2015-11-07 DIAGNOSIS — Z79899 Other long term (current) drug therapy: Secondary | ICD-10-CM | POA: Insufficient documentation

## 2015-11-07 DIAGNOSIS — N184 Chronic kidney disease, stage 4 (severe): Secondary | ICD-10-CM | POA: Insufficient documentation

## 2015-11-07 LAB — IRON AND TIBC
IRON: 104 ug/dL (ref 28–170)
SATURATION RATIOS: 37 % — AB (ref 10.4–31.8)
TIBC: 280 ug/dL (ref 250–450)
UIBC: 176 ug/dL

## 2015-11-07 LAB — POCT HEMOGLOBIN-HEMACUE: Hemoglobin: 9.9 g/dL — ABNORMAL LOW (ref 12.0–15.0)

## 2015-11-07 LAB — FERRITIN: Ferritin: 687 ng/mL — ABNORMAL HIGH (ref 11–307)

## 2015-11-07 MED ORDER — EPOETIN ALFA 20000 UNIT/ML IJ SOLN
INTRAMUSCULAR | Status: AC
  Administered 2015-11-07: 20000 [IU] via SUBCUTANEOUS
  Filled 2015-11-07: qty 1

## 2015-11-07 MED ORDER — EPOETIN ALFA 10000 UNIT/ML IJ SOLN
10000.0000 [IU] | Freq: Once | INTRAMUSCULAR | Status: AC
  Administered 2015-11-07: 10000 [IU] via SUBCUTANEOUS

## 2015-11-07 MED ORDER — EPOETIN ALFA 20000 UNIT/ML IJ SOLN
20000.0000 [IU] | INTRAMUSCULAR | Status: DC
  Administered 2015-11-07: 20000 [IU] via SUBCUTANEOUS

## 2015-11-07 MED ORDER — EPOETIN ALFA 10000 UNIT/ML IJ SOLN
INTRAMUSCULAR | Status: AC
  Administered 2015-11-07: 10000 [IU] via SUBCUTANEOUS
  Filled 2015-11-07: qty 1

## 2015-11-20 ENCOUNTER — Other Ambulatory Visit (HOSPITAL_COMMUNITY): Payer: Self-pay | Admitting: *Deleted

## 2015-11-20 ENCOUNTER — Encounter: Payer: Self-pay | Admitting: Surgery

## 2015-11-21 ENCOUNTER — Encounter (HOSPITAL_COMMUNITY)
Admission: RE | Admit: 2015-11-21 | Discharge: 2015-11-21 | Disposition: A | Discharge status: Home / Self Care | Payer: Medicare Other | Source: Ambulatory Visit | Attending: Nephrology | Admitting: Nephrology

## 2015-11-21 DIAGNOSIS — N184 Chronic kidney disease, stage 4 (severe): Secondary | ICD-10-CM | POA: Diagnosis not present

## 2015-11-21 LAB — POCT HEMOGLOBIN-HEMACUE: Hemoglobin: 10.3 g/dL — ABNORMAL LOW (ref 12.0–15.0)

## 2015-11-21 MED ORDER — EPOETIN ALFA 40000 UNIT/ML IJ SOLN: 30000.0000 [IU] | INTRAMUSCULAR | Status: DC

## 2015-11-21 MED ORDER — EPOETIN ALFA 10000 UNIT/ML IJ SOLN
INTRAMUSCULAR | Status: AC
  Administered 2015-11-21: 10000 [IU] via SUBCUTANEOUS
  Filled 2015-11-21: qty 1

## 2015-11-21 MED ORDER — EPOETIN ALFA 20000 UNIT/ML IJ SOLN
INTRAMUSCULAR | Status: AC
  Administered 2015-11-21: 20000 [IU] via SUBCUTANEOUS
  Filled 2015-11-21: qty 1

## 2015-11-24 ENCOUNTER — Ambulatory Visit (INDEPENDENT_AMBULATORY_CARE_PROVIDER_SITE_OTHER): Payer: Medicare Other | Admitting: Surgery

## 2015-11-24 ENCOUNTER — Encounter: Payer: Self-pay | Admitting: Surgery

## 2015-11-24 ENCOUNTER — Ambulatory Visit (HOSPITAL_COMMUNITY)
Admission: RE | Admit: 2015-11-24 | Discharge: 2015-11-24 | Disposition: A | Discharge status: Home / Self Care | Payer: Medicare Other | Source: Ambulatory Visit | Attending: Vascular Surgery | Admitting: Vascular Surgery

## 2015-11-24 ENCOUNTER — Ambulatory Visit (INDEPENDENT_AMBULATORY_CARE_PROVIDER_SITE_OTHER)
Admission: RE | Admit: 2015-11-24 | Discharge: 2015-11-24 | Disposition: A | Discharge status: Home / Self Care | Payer: Medicare Other | Source: Ambulatory Visit | Attending: Vascular Surgery | Admitting: Vascular Surgery

## 2015-11-24 VITALS — BP 166/85 | HR 73 | Temp 97.4°F | Resp 20 | Ht 61.0 in | Wt 213.0 lb

## 2015-11-24 DIAGNOSIS — Z01810 Encounter for preprocedural cardiovascular examination: Secondary | ICD-10-CM | POA: Insufficient documentation

## 2015-11-24 DIAGNOSIS — N186 End stage renal disease: Secondary | ICD-10-CM | POA: Diagnosis not present

## 2015-11-24 DIAGNOSIS — Z992 Dependence on renal dialysis: Secondary | ICD-10-CM | POA: Diagnosis not present

## 2015-11-24 DIAGNOSIS — N184 Chronic kidney disease, stage 4 (severe): Secondary | ICD-10-CM

## 2015-11-24 DIAGNOSIS — I129 Hypertensive chronic kidney disease with stage 1 through stage 4 chronic kidney disease, or unspecified chronic kidney disease: Secondary | ICD-10-CM | POA: Insufficient documentation

## 2015-11-24 NOTE — Progress Notes (Signed)
[facility]  Patient name: [name] MRN: [id number] DOB: 08-21-1940 Sex: female  REFERRING PHYSICIAN: Dr Detterding  REASON FOR CONSULT: Renal access  HPI: [name] is a 75 y.o. female, who is referred today for dialysis access.  She is right-handed.  She is not yet on dialysis.  Her renal failure secondary to hypertension.  She is a former smoker  Past Medical History:  Diagnosis Date  . Heart murmur   . Hypertension     No family history on file.  SOCIAL HISTORY: Social History   Social History  . Marital status: Single    Spouse name: N/A  . Number of children: N/A  . Years of education: N/A   Occupational History  . Not on file.   Social History Main Topics  . Smoking status: Former Smoker    Types: Cigarettes  . Smokeless tobacco: Former Systems developer  . Alcohol use No  . Drug use: No  . Sexual activity: Not on file   Other Topics Concern  . Not on file   Social History Narrative  . No narrative on file    No Known Allergies  Current Outpatient Prescriptions  Medication Sig Dispense Refill  . furosemide (LASIX) 80 MG tablet Take 160 mg by mouth 2 (two) times daily.    . nebivolol (BYSTOLIC) 10 MG tablet Take 10 mg by mouth daily.    . potassium chloride SA (K-DUR,KLOR-CON) 20 MEQ tablet Take 40 mEq by mouth 2 (two) times daily. For 3 days     No current facility-administered medications for this visit.     REVIEW OF SYSTEMS:  [X]  denotes positive finding, [ ]  denotes negative finding Cardiac  Comments:  Chest pain or chest pressure: x   Shortness of breath upon exertion:    Short of breath when lying flat:    Irregular heart rhythm:        Vascular    Pain in calf, thigh, or hip brought on by ambulation:    Pain in feet at night that wakes you up from your sleep:  x   Blood clot in your veins:    Leg swelling:         Pulmonary    Oxygen at home:    Productive cough:      Wheezing:         Neurologic    Sudden weakness in arms or legs:     Sudden numbness in arms or legs:     Sudden onset of difficulty speaking or slurred speech: x   Temporary loss of vision in one eye:     Problems with dizziness:         Gastrointestinal    Blood in stool:     Vomited blood:         Genitourinary    Burning when urinating:     Blood in urine:        Psychiatric    Major depression:         Hematologic    Bleeding problems:    Problems with blood clotting too easily:        Skin    Rashes or ulcers:        Constitutional    Fever or chills:      PHYSICAL EXAM: Vitals:   11/24/15 1545 11/24/15 1552  BP: (!) 165/84 (!) 166/85  Pulse: 73   Resp: 20   Temp: 97.4 F (36.3 C)  TempSrc: Oral   SpO2: 100%   Weight: 213 lb (96.6 kg)   Height: 5\' 1"  (1.549 m)     GENERAL: The patient is a well-nourished female, in no acute distress. The vital signs are documented above. CARDIAC: There is a regular rate and rhythm.  VASCULAR: Palpable left radial and brachial pulse PULMONARY: There is good air exchange bilaterally without wheezing or rales. MUSCULOSKELETAL: There are no major deformities or cyanosis. NEUROLOGIC: No focal weakness or paresthesias are detected. SKIN: There are no ulcers or rashes noted. PSYCHIATRIC: The patient has a normal affect.  DATA:  I have reviewed her vascular lab studies.  She has biphasic waveforms on the left ear the brachial bifurcation is in the antecubital crease. I reviewed her vein mapping.  She has next a left cephalic vein  MEDICAL ISSUES: Chronic renal insufficiency, stage IV: I discussed proceeding with a left arm fistula.  I told her I would evaluate the cephalic vein at the wrist and most likely proceed with a radiocephalic fistula although this might require a brachiocephalic fistula.  We discussed the risk of non-maturity and the need for additional interventions.  All of her questions were answered.  She is  still not sure if she wants to start dialysis.  She is going to contemplate this and contact me if she wants to proceed with fistula creation surgery.   Annamarie Major, MD [facility] [phone] Pager [phone]

## 2015-11-27 ENCOUNTER — Encounter: Payer: Self-pay | Admitting: Nephrology

## 2015-11-27 DIAGNOSIS — D638 Anemia in other chronic diseases classified elsewhere: Secondary | ICD-10-CM | POA: Insufficient documentation

## 2015-12-05 ENCOUNTER — Encounter (HOSPITAL_COMMUNITY)
Admission: RE | Admit: 2015-12-05 | Discharge: 2015-12-05 | Disposition: A | Discharge status: Home / Self Care | Payer: Medicare Other | Source: Ambulatory Visit | Attending: Nephrology | Admitting: Nephrology

## 2015-12-05 DIAGNOSIS — D638 Anemia in other chronic diseases classified elsewhere: Secondary | ICD-10-CM

## 2015-12-05 DIAGNOSIS — N184 Chronic kidney disease, stage 4 (severe): Secondary | ICD-10-CM | POA: Insufficient documentation

## 2015-12-05 DIAGNOSIS — D509 Iron deficiency anemia, unspecified: Secondary | ICD-10-CM | POA: Diagnosis not present

## 2015-12-05 DIAGNOSIS — Z5181 Encounter for therapeutic drug level monitoring: Secondary | ICD-10-CM | POA: Insufficient documentation

## 2015-12-05 DIAGNOSIS — Z79899 Other long term (current) drug therapy: Secondary | ICD-10-CM | POA: Diagnosis not present

## 2015-12-05 DIAGNOSIS — D631 Anemia in chronic kidney disease: Secondary | ICD-10-CM | POA: Diagnosis not present

## 2015-12-05 LAB — IRON AND TIBC
Iron: 85 ug/dL (ref 28–170)
SATURATION RATIOS: 31 % (ref 10.4–31.8)
TIBC: 274 ug/dL (ref 250–450)
UIBC: 189 ug/dL

## 2015-12-05 LAB — FERRITIN: Ferritin: 548 ng/mL — ABNORMAL HIGH (ref 11–307)

## 2015-12-05 LAB — POCT HEMOGLOBIN-HEMACUE: HEMOGLOBIN: 10.6 g/dL — AB (ref 12.0–15.0)

## 2015-12-05 MED ORDER — EPOETIN ALFA 10000 UNIT/ML IJ SOLN
INTRAMUSCULAR | Status: AC
  Administered 2015-12-05: 10000 [IU] via SUBCUTANEOUS
  Filled 2015-12-05: qty 1

## 2015-12-05 MED ORDER — EPOETIN ALFA 20000 UNIT/ML IJ SOLN
INTRAMUSCULAR | Status: AC
  Administered 2015-12-05: 20000 [IU] via SUBCUTANEOUS
  Filled 2015-12-05: qty 1

## 2015-12-05 MED ORDER — EPOETIN ALFA 40000 UNIT/ML IJ SOLN: 30000.0000 [IU] | INTRAMUSCULAR | Status: DC

## 2015-12-10 ENCOUNTER — Other Ambulatory Visit: Payer: Self-pay

## 2015-12-19 ENCOUNTER — Encounter (HOSPITAL_COMMUNITY)
Admission: RE | Admit: 2015-12-19 | Discharge: 2015-12-19 | Disposition: A | Discharge status: Home / Self Care | Payer: Medicare Other | Source: Ambulatory Visit | Attending: Nephrology | Admitting: Nephrology

## 2015-12-19 DIAGNOSIS — N184 Chronic kidney disease, stage 4 (severe): Secondary | ICD-10-CM | POA: Diagnosis not present

## 2015-12-19 DIAGNOSIS — D638 Anemia in other chronic diseases classified elsewhere: Secondary | ICD-10-CM

## 2015-12-19 LAB — POCT HEMOGLOBIN-HEMACUE: HEMOGLOBIN: 10.9 g/dL — AB (ref 12.0–15.0)

## 2015-12-19 MED ORDER — EPOETIN ALFA 20000 UNIT/ML IJ SOLN
INTRAMUSCULAR | Status: AC
  Administered 2015-12-19: 20000 [IU]
  Filled 2015-12-19: qty 1

## 2015-12-19 MED ORDER — EPOETIN ALFA 10000 UNIT/ML IJ SOLN
INTRAMUSCULAR | Status: AC
  Administered 2015-12-19: 10000 [IU]
  Filled 2015-12-19: qty 1

## 2015-12-19 MED ORDER — EPOETIN ALFA 40000 UNIT/ML IJ SOLN: 30000.0000 [IU] | INTRAMUSCULAR | Status: DC

## 2016-01-01 ENCOUNTER — Encounter (HOSPITAL_COMMUNITY): Payer: Self-pay | Admitting: *Deleted

## 2016-01-01 MED ORDER — DEXTROSE 5 % IV SOLN
1.5000 g | INTRAVENOUS | Status: AC
  Filled 2016-01-01: qty 1.5

## 2016-01-02 ENCOUNTER — Ambulatory Visit (HOSPITAL_COMMUNITY)
Admission: RE | Admit: 2016-01-02 | Discharge: 2016-01-02 | Disposition: A | Discharge status: Home / Self Care | Payer: Medicare Other | Source: Ambulatory Visit | Attending: Nephrology | Admitting: Nephrology

## 2016-01-02 ENCOUNTER — Encounter (HOSPITAL_COMMUNITY)
Admission: RE | Disposition: A | Discharge status: Home / Self Care | Payer: Self-pay | Source: Ambulatory Visit | Attending: Surgery

## 2016-01-02 ENCOUNTER — Encounter (HOSPITAL_COMMUNITY): Payer: Self-pay | Admitting: Anesthesiology

## 2016-01-02 ENCOUNTER — Ambulatory Visit (HOSPITAL_COMMUNITY)
Admission: RE | Admit: 2016-01-02 | Discharge: 2016-01-02 | Disposition: A | Discharge status: Home / Self Care | Payer: Medicare Other | Source: Ambulatory Visit | Attending: Surgery | Admitting: Surgery

## 2016-01-02 DIAGNOSIS — Z5329 Procedure and treatment not carried out because of patient's decision for other reasons: Secondary | ICD-10-CM

## 2016-01-02 DIAGNOSIS — N184 Chronic kidney disease, stage 4 (severe): Secondary | ICD-10-CM | POA: Insufficient documentation

## 2016-01-02 DIAGNOSIS — Z79899 Other long term (current) drug therapy: Secondary | ICD-10-CM | POA: Diagnosis not present

## 2016-01-02 DIAGNOSIS — D631 Anemia in chronic kidney disease: Secondary | ICD-10-CM | POA: Diagnosis not present

## 2016-01-02 DIAGNOSIS — Z5181 Encounter for therapeutic drug level monitoring: Secondary | ICD-10-CM | POA: Insufficient documentation

## 2016-01-02 DIAGNOSIS — D638 Anemia in other chronic diseases classified elsewhere: Secondary | ICD-10-CM

## 2016-01-02 HISTORY — DX: Pneumonia, unspecified organism: J18.9

## 2016-01-02 HISTORY — DX: Reserved for inherently not codable concepts without codable children: IMO0001

## 2016-01-02 HISTORY — DX: Cramp and spasm: R25.2

## 2016-01-02 LAB — IRON AND TIBC
IRON: 122 ug/dL (ref 28–170)
Saturation Ratios: 46 % — ABNORMAL HIGH (ref 10.4–31.8)
TIBC: 267 ug/dL (ref 250–450)
UIBC: 145 ug/dL

## 2016-01-02 LAB — POCT HEMOGLOBIN-HEMACUE: HEMOGLOBIN: 10.8 g/dL — AB (ref 12.0–15.0)

## 2016-01-02 LAB — FERRITIN: Ferritin: 603 ng/mL — ABNORMAL HIGH (ref 11–307)

## 2016-01-02 SURGERY — ARTERIOVENOUS (AV) FISTULA CREATION
Anesthesia: Monitor Anesthesia Care | Laterality: Left

## 2016-01-02 MED ORDER — SODIUM CHLORIDE 0.9 % IV SOLN: INTRAVENOUS | Status: DC

## 2016-01-02 MED ORDER — EPOETIN ALFA 20000 UNIT/ML IJ SOLN
INTRAMUSCULAR | Status: AC
  Administered 2016-01-02: 20000 [IU]
  Filled 2016-01-02: qty 1

## 2016-01-02 MED ORDER — EPOETIN ALFA 10000 UNIT/ML IJ SOLN
INTRAMUSCULAR | Status: AC
  Administered 2016-01-02: 10000 [IU]
  Filled 2016-01-02: qty 1

## 2016-01-02 MED ORDER — CHLORHEXIDINE GLUCONATE CLOTH 2 % EX PADS: 6.0000 | MEDICATED_PAD | Freq: Once | CUTANEOUS | Status: DC

## 2016-01-02 MED ORDER — CHLORHEXIDINE GLUCONATE CLOTH 2 % EX PADS
6.0000 | MEDICATED_PAD | Freq: Once | CUTANEOUS | Status: DC
Start: 2016-01-02 — End: 2016-01-04

## 2016-01-02 MED ORDER — EPOETIN ALFA 40000 UNIT/ML IJ SOLN: 30000.0000 [IU] | INTRAMUSCULAR | Status: DC

## 2016-01-02 NOTE — Progress Notes (Signed)
Patient discharged to home. All patient belongings taken with patient.

## 2016-01-02 NOTE — Progress Notes (Signed)
Nurse entered room and noticed that patient was sitting in chair fully dressed. Nurse asked patient was everything alright, and patient stated she was informed that her surgery would be three hours delayed as Dr. Trula Slade was still in surgery, and that someone else could do the surgery, however no one had come yet, and she only wanted Dr. Trula Slade to do her surgery. Nurse called OR, and OR front desk staff informed Nurse that Dr. Trula Slade was still in surgery, and Dr. Donnetta Hutching was coming to see patient. Patient informed of this. Patient stated she had been waiting for 45 minutes for someone to come talk to her, and that she was ready to go home. Will page Dr. Donnetta Hutching and inform him of this.

## 2016-01-02 NOTE — Progress Notes (Signed)
Nurse paged Dr. Donnetta Hutching. Dr. Donnetta Hutching returned page and Nurse informed him that patient only wanted Dr. Trula Slade to do her surgery. Dr. Donnetta Hutching stated that was fine and that their office staff would call patient to reschedule. Will inform patient of this.

## 2016-01-02 NOTE — Progress Notes (Signed)
   Patient has decided to leave and reschedule with Dr. Trula Slade at a different date. She has been offered to have surgery with me today but would rather wait for him. Schedulers are aware and she will be contacted to schedule at a later date.   Yarimar Lavis C. Donzetta Matters, MD [facility] [place] Office: [phone] Pager: [phone]

## 2016-01-02 NOTE — Progress Notes (Signed)
Dr. Donzetta Matters arrived to unit requesting whereabouts of patient. Nurse informed Dr. Donzetta Matters that patient had been discharged from Short Stay, however she was having a injection in Medical Day. Nurse then escorted Dr. Donzetta Matters to Medical Day to see patient. Patient smiled and thanked Dr. Donzetta Matters for coming to speak with her.

## 2016-01-12 ENCOUNTER — Other Ambulatory Visit: Payer: Self-pay

## 2016-01-16 ENCOUNTER — Encounter (HOSPITAL_COMMUNITY)
Admission: RE | Admit: 2016-01-16 | Discharge: 2016-01-16 | Disposition: A | Discharge status: Home / Self Care | Payer: Medicare Other | Source: Ambulatory Visit | Attending: Nephrology | Admitting: Nephrology

## 2016-01-16 DIAGNOSIS — Z79899 Other long term (current) drug therapy: Secondary | ICD-10-CM | POA: Insufficient documentation

## 2016-01-16 DIAGNOSIS — D631 Anemia in chronic kidney disease: Secondary | ICD-10-CM | POA: Insufficient documentation

## 2016-01-16 DIAGNOSIS — Z5181 Encounter for therapeutic drug level monitoring: Secondary | ICD-10-CM | POA: Insufficient documentation

## 2016-01-16 DIAGNOSIS — N184 Chronic kidney disease, stage 4 (severe): Secondary | ICD-10-CM | POA: Insufficient documentation

## 2016-01-16 DIAGNOSIS — D509 Iron deficiency anemia, unspecified: Secondary | ICD-10-CM | POA: Diagnosis not present

## 2016-01-16 DIAGNOSIS — D638 Anemia in other chronic diseases classified elsewhere: Secondary | ICD-10-CM

## 2016-01-16 LAB — POCT HEMOGLOBIN-HEMACUE: HEMOGLOBIN: 10.7 g/dL — AB (ref 12.0–15.0)

## 2016-01-16 MED ORDER — EPOETIN ALFA 20000 UNIT/ML IJ SOLN
INTRAMUSCULAR | Status: AC
  Administered 2016-01-16: 20000 [IU] via SUBCUTANEOUS
  Filled 2016-01-16: qty 1

## 2016-01-16 MED ORDER — EPOETIN ALFA 40000 UNIT/ML IJ SOLN: 30000.0000 [IU] | INTRAMUSCULAR | Status: DC

## 2016-01-16 MED ORDER — EPOETIN ALFA 10000 UNIT/ML IJ SOLN
INTRAMUSCULAR | Status: AC
  Administered 2016-01-16: 10000 [IU] via SUBCUTANEOUS
  Filled 2016-01-16: qty 1

## 2016-01-22 ENCOUNTER — Encounter (HOSPITAL_COMMUNITY): Payer: Self-pay | Admitting: *Deleted

## 2016-01-22 NOTE — Progress Notes (Signed)
Pt denies SOB, chest pain, and being under the care of a cardiologist. Pt stated that a cardiac cath and stress test were performed >10 years ago. Pt denies having an echo. Pt denies having a chest x ray within the last year. Pt made aware to stop taking vitamins, fish oil and herbal medications. Do not take any NSAIDs ie: Ibuprofen, Advil, Naproxen, BC and Goody Powder. Pt verbalized understanding of all pre-op instructions.

## 2016-01-23 ENCOUNTER — Encounter (HOSPITAL_COMMUNITY)
Admission: RE | Disposition: A | Discharge status: Home / Self Care | Payer: Self-pay | Source: Ambulatory Visit | Attending: Surgery

## 2016-01-23 ENCOUNTER — Ambulatory Visit (HOSPITAL_COMMUNITY): Payer: Medicare Other | Admitting: Critical Care Medicine

## 2016-01-23 ENCOUNTER — Other Ambulatory Visit: Payer: Self-pay | Admitting: *Deleted

## 2016-01-23 ENCOUNTER — Ambulatory Visit (HOSPITAL_COMMUNITY)
Admission: RE | Admit: 2016-01-23 | Discharge: 2016-01-23 | Disposition: A | Discharge status: Home / Self Care | Payer: Medicare Other | Source: Ambulatory Visit | Attending: Surgery | Admitting: Surgery

## 2016-01-23 ENCOUNTER — Encounter (HOSPITAL_COMMUNITY): Payer: Self-pay | Admitting: Urology

## 2016-01-23 DIAGNOSIS — N184 Chronic kidney disease, stage 4 (severe): Secondary | ICD-10-CM | POA: Diagnosis not present

## 2016-01-23 DIAGNOSIS — N186 End stage renal disease: Secondary | ICD-10-CM

## 2016-01-23 DIAGNOSIS — Z87891 Personal history of nicotine dependence: Secondary | ICD-10-CM | POA: Diagnosis not present

## 2016-01-23 DIAGNOSIS — Z79899 Other long term (current) drug therapy: Secondary | ICD-10-CM | POA: Diagnosis not present

## 2016-01-23 DIAGNOSIS — Z4931 Encounter for adequacy testing for hemodialysis: Secondary | ICD-10-CM

## 2016-01-23 DIAGNOSIS — R0602 Shortness of breath: Secondary | ICD-10-CM | POA: Insufficient documentation

## 2016-01-23 DIAGNOSIS — I129 Hypertensive chronic kidney disease with stage 1 through stage 4 chronic kidney disease, or unspecified chronic kidney disease: Secondary | ICD-10-CM | POA: Insufficient documentation

## 2016-01-23 HISTORY — DX: Anemia, unspecified: D64.9

## 2016-01-23 HISTORY — DX: Chronic kidney disease, unspecified: N18.9

## 2016-01-23 HISTORY — PX: AV FISTULA PLACEMENT: SHX1204

## 2016-01-23 HISTORY — DX: Other allergic rhinitis: J30.89

## 2016-01-23 LAB — POCT I-STAT 4, (NA,K, GLUC, HGB,HCT)
Glucose, Bld: 99 mg/dL (ref 65–99)
HEMATOCRIT: 32 % — AB (ref 36.0–46.0)
HEMOGLOBIN: 10.9 g/dL — AB (ref 12.0–15.0)
Potassium: 4.4 mmol/L (ref 3.5–5.1)
Sodium: 146 mmol/L — ABNORMAL HIGH (ref 135–145)

## 2016-01-23 SURGERY — ARTERIOVENOUS (AV) FISTULA CREATION
Anesthesia: General | Site: Arm Lower | Laterality: Left

## 2016-01-23 MED ORDER — PHENYLEPHRINE HCL 10 MG/ML IJ SOLN
INTRAMUSCULAR | Status: DC | PRN
Start: 2016-01-23 — End: 2016-01-23
  Administered 2016-01-23: 120 ug via INTRAVENOUS

## 2016-01-23 MED ORDER — PROPOFOL 10 MG/ML IV BOLUS
INTRAVENOUS | Status: DC | PRN
  Administered 2016-01-23 (×2): 20 mg via INTRAVENOUS

## 2016-01-23 MED ORDER — FENTANYL CITRATE (PF) 100 MCG/2ML IJ SOLN
INTRAMUSCULAR | Status: AC
  Filled 2016-01-23: qty 2

## 2016-01-23 MED ORDER — LIDOCAINE-EPINEPHRINE (PF) 1 %-1:200000 IJ SOLN
INTRAMUSCULAR | Status: DC | PRN
  Administered 2016-01-23: 7 mL

## 2016-01-23 MED ORDER — PHENYLEPHRINE HCL 10 MG/ML IJ SOLN
INTRAVENOUS | Status: DC | PRN
  Administered 2016-01-23: 30 ug/min via INTRAVENOUS

## 2016-01-23 MED ORDER — 0.9 % SODIUM CHLORIDE (POUR BTL) OPTIME
TOPICAL | Status: DC | PRN
  Administered 2016-01-23: 1000 mL

## 2016-01-23 MED ORDER — CHLORHEXIDINE GLUCONATE CLOTH 2 % EX PADS: 6.0000 | MEDICATED_PAD | Freq: Once | CUTANEOUS | Status: DC

## 2016-01-23 MED ORDER — LIDOCAINE HCL (CARDIAC) 20 MG/ML IV SOLN
INTRAVENOUS | Status: DC | PRN
  Administered 2016-01-23: 60 mg via INTRATRACHEAL

## 2016-01-23 MED ORDER — MIDAZOLAM HCL 2 MG/2ML IJ SOLN
INTRAMUSCULAR | Status: AC
  Filled 2016-01-23: qty 2

## 2016-01-23 MED ORDER — PROPOFOL 10 MG/ML IV BOLUS
INTRAVENOUS | Status: AC
  Filled 2016-01-23: qty 20

## 2016-01-23 MED ORDER — SODIUM CHLORIDE 0.9 % IV SOLN
INTRAVENOUS | Status: DC | PRN
  Administered 2016-01-23: 500 mL

## 2016-01-23 MED ORDER — SODIUM CHLORIDE 0.9 % IV SOLN
INTRAVENOUS | Status: DC
  Administered 2016-01-23: 09:00:00 via INTRAVENOUS

## 2016-01-23 MED ORDER — OXYCODONE-ACETAMINOPHEN 5-325 MG PO TABS: 1.0000 | ORAL_TABLET | Freq: Four times a day (QID) | ORAL | 0 refills | Status: DC | PRN

## 2016-01-23 MED ORDER — DEXTROSE 5 % IV SOLN
1.5000 g | INTRAVENOUS | Status: AC
  Administered 2016-01-23: 1.5 g via INTRAVENOUS
  Filled 2016-01-23: qty 1.5

## 2016-01-23 MED ORDER — PROPOFOL 500 MG/50ML IV EMUL
INTRAVENOUS | Status: DC | PRN
  Administered 2016-01-23: 50 ug/kg/min via INTRAVENOUS

## 2016-01-23 MED ORDER — LIDOCAINE-EPINEPHRINE (PF) 1 %-1:200000 IJ SOLN
INTRAMUSCULAR | Status: AC
  Filled 2016-01-23: qty 30

## 2016-01-23 MED ORDER — FENTANYL CITRATE (PF) 100 MCG/2ML IJ SOLN
INTRAMUSCULAR | Status: DC | PRN
  Administered 2016-01-23 (×2): 25 ug via INTRAVENOUS

## 2016-01-23 MED ORDER — SODIUM CHLORIDE 0.9 % IV SOLN
INTRAVENOUS | Status: DC
  Administered 2016-01-23 (×2): via INTRAVENOUS

## 2016-01-23 MED ORDER — MIDAZOLAM HCL 5 MG/5ML IJ SOLN
INTRAMUSCULAR | Status: DC | PRN
  Administered 2016-01-23 (×2): 1 mg via INTRAVENOUS

## 2016-01-23 SURGICAL SUPPLY — 32 items
ARMBAND PINK RESTRICT EXTREMIT (MISCELLANEOUS) ×4 IMPLANT
CANISTER SUCTION 2500CC (MISCELLANEOUS) ×3 IMPLANT
CLIP TI MEDIUM 6 (CLIP) ×3 IMPLANT
CLIP TI WIDE RED SMALL 6 (CLIP) ×3 IMPLANT
COVER PROBE W GEL 5X96 (DRAPES) ×3 IMPLANT
ELECT REM PT RETURN 9FT ADLT (ELECTROSURGICAL) ×3
ELECTRODE REM PT RTRN 9FT ADLT (ELECTROSURGICAL) ×1 IMPLANT
GLOVE BIO SURGEON STRL SZ 6.5 (GLOVE) ×3 IMPLANT
GLOVE BIO SURGEONS STRL SZ 6.5 (GLOVE) ×3
GLOVE BIOGEL PI IND STRL 6.5 (GLOVE) IMPLANT
GLOVE BIOGEL PI IND STRL 7.5 (GLOVE) ×1 IMPLANT
GLOVE BIOGEL PI INDICATOR 6.5 (GLOVE) ×6
GLOVE BIOGEL PI INDICATOR 7.5 (GLOVE) ×2
GLOVE SURG SS PI 6.0 STRL IVOR (GLOVE) ×4 IMPLANT
GLOVE SURG SS PI 7.5 STRL IVOR (GLOVE) ×3 IMPLANT
GOWN STRL REUS W/ TWL LRG LVL3 (GOWN DISPOSABLE) ×2 IMPLANT
GOWN STRL REUS W/ TWL XL LVL3 (GOWN DISPOSABLE) ×1 IMPLANT
GOWN STRL REUS W/TWL LRG LVL3 (GOWN DISPOSABLE) ×9
GOWN STRL REUS W/TWL XL LVL3 (GOWN DISPOSABLE) ×3
HEMOSTAT SNOW SURGICEL 2X4 (HEMOSTASIS) IMPLANT
KIT BASIN OR (CUSTOM PROCEDURE TRAY) ×3 IMPLANT
KIT ROOM TURNOVER OR (KITS) ×3 IMPLANT
LIQUID BAND (GAUZE/BANDAGES/DRESSINGS) ×3 IMPLANT
NS IRRIG 1000ML POUR BTL (IV SOLUTION) ×3 IMPLANT
PACK CV ACCESS (CUSTOM PROCEDURE TRAY) ×3 IMPLANT
PAD ARMBOARD 7.5X6 YLW CONV (MISCELLANEOUS) ×6 IMPLANT
SUT PROLENE 6 0 CC (SUTURE) ×5 IMPLANT
SUT VIC AB 3-0 SH 27 (SUTURE) ×3
SUT VIC AB 3-0 SH 27X BRD (SUTURE) ×1 IMPLANT
SUT VICRYL 4-0 PS2 18IN ABS (SUTURE) ×3 IMPLANT
UNDERPAD 30X30 (UNDERPADS AND DIAPERS) ×3 IMPLANT
WATER STERILE IRR 1000ML POUR (IV SOLUTION) ×3 IMPLANT

## 2016-01-23 NOTE — Anesthesia Preprocedure Evaluation (Addendum)
Anesthesia Evaluation  Patient identified by MRN, date of birth, ID band Patient awake    Reviewed: Allergy & Precautions, NPO status , Patient's Chart, lab work & pertinent test results, reviewed documented beta blocker date and time   Airway Mallampati: II  TM Distance: >3 FB Neck ROM: Full    Dental  (+) Dental Advisory Given, Lower Dentures, Upper Dentures   Pulmonary shortness of breath,    breath sounds clear to auscultation       Cardiovascular hypertension, Pt. on medications and Pt. on home beta blockers  Rhythm:Regular Rate:Normal     Neuro/Psych    GI/Hepatic   Endo/Other    Renal/GU      Musculoskeletal   Abdominal   Peds  Hematology  (+) anemia ,   Anesthesia Other Findings   Reproductive/Obstetrics                           Anesthesia Physical Anesthesia Plan  ASA: III  Anesthesia Plan: General   Post-op Pain Management:    Induction: Intravenous  Airway Management Planned: Natural Airway and Simple Face Mask  Additional Equipment:   Intra-op Plan:   Post-operative Plan:   Informed Consent: I have reviewed the patients History and Physical, chart, labs and discussed the procedure including the risks, benefits and alternatives for the proposed anesthesia with the patient or authorized representative who has indicated his/her understanding and acceptance.     Plan Discussed with: CRNA and Anesthesiologist  Anesthesia Plan Comments:         Anesthesia Quick Evaluation

## 2016-01-23 NOTE — Anesthesia Procedure Notes (Signed)
Procedure Name: MAC Date/Time: 01/23/2016 11:36 AM Performed by: Merrilyn Puma B Pre-anesthesia Checklist: Patient identified, Emergency Drugs available, Suction available and Patient being monitored Patient Re-evaluated:Patient Re-evaluated prior to inductionOxygen Delivery Method: Simple face mask

## 2016-01-23 NOTE — H&P (Signed)
[facility]  Patient name: [name]           MRN: [id number]        DOB: [birth year]          Sex: female  REFERRING PHYSICIAN: Dr Detterding  REASON FOR CONSULT: Renal access  HPI: [name] is a 75 y.o. female, who is referred today for dialysis access.  She is right-handed.  She is not yet on dialysis.  Her renal failure secondary to hypertension.  She is a former smoker      Past Medical History:  Diagnosis Date  . Heart murmur   . Hypertension     No family history on file.  SOCIAL HISTORY: Social History        Social History  . Marital status: Single    Spouse name: N/A  . Number of children: N/A  . Years of education: N/A      Occupational History  . Not on file.        Social History Main Topics  . Smoking status: Former Smoker    Types: Cigarettes  . Smokeless tobacco: Former Systems developer  . Alcohol use No  . Drug use: No  . Sexual activity: Not on file       Other Topics Concern  . Not on file      Social History Narrative  . No narrative on file    No Known Allergies        Current Outpatient Prescriptions  Medication Sig Dispense Refill  . furosemide (LASIX) 80 MG tablet Take 160 mg by mouth 2 (two) times daily.    . nebivolol (BYSTOLIC) 10 MG tablet Take 10 mg by mouth daily.    . potassium chloride SA (K-DUR,KLOR-CON) 20 MEQ tablet Take 40 mEq by mouth 2 (two) times daily. For 3 days     No current facility-administered medications for this visit.     REVIEW OF SYSTEMS:  [X]  denotes positive finding, [ ]  denotes negative finding Cardiac  Comments:  Chest pain or chest pressure: x   Shortness of breath upon exertion:    Short of breath when lying flat:    Irregular heart rhythm:        Vascular    Pain in calf, thigh, or hip brought on by ambulation:    Pain in feet at night that wakes you up from your sleep:  x   Blood clot in your veins:      Leg swelling:         Pulmonary    Oxygen at home:    Productive cough:     Wheezing:         Neurologic    Sudden weakness in arms or legs:     Sudden numbness in arms or legs:     Sudden onset of difficulty speaking or slurred speech: x   Temporary loss of vision in one eye:     Problems with dizziness:         Gastrointestinal    Blood in stool:     Vomited blood:         Genitourinary    Burning when urinating:     Blood in urine:        Psychiatric    Major depression:         Hematologic    Bleeding problems:    Problems with blood clotting too easily:  Skin    Rashes or ulcers:        Constitutional    Fever or chills:      PHYSICAL EXAM:     Vitals:   11/24/15 1545 11/24/15 1552  BP: (!) 165/84 (!) 166/85  Pulse: 73   Resp: 20   Temp: 97.4 F (36.3 C)   TempSrc: Oral   SpO2: 100%   Weight: 213 lb (96.6 kg)   Height: 5\' 1"  (1.549 m)     GENERAL: The patient is a well-nourished female, in no acute distress. The vital signs are documented above. CARDIAC: There is a regular rate and rhythm.  VASCULAR: Palpable left radial and brachial pulse PULMONARY: There is good air exchange bilaterally without wheezing or rales. MUSCULOSKELETAL: There are no major deformities or cyanosis. NEUROLOGIC: No focal weakness or paresthesias are detected. SKIN: There are no ulcers or rashes noted. PSYCHIATRIC: The patient has a normal affect.  DATA:  I have reviewed her vascular lab studies.  She has biphasic waveforms on the left ear the brachial bifurcation is in the antecubital crease. I reviewed her vein mapping.  She has next a left cephalic vein  MEDICAL ISSUES: Chronic renal insufficiency, stage IV: I discussed proceeding with a left arm fistula.  I told her I would evaluate the cephalic vein at the wrist and most likely proceed with a radiocephalic fistula although  this might require a brachiocephalic fistula.  We discussed the risk of non-maturity and the need for additional interventions.  All of her questions were answered.  She is still not sure if she wants to start dialysis.  She is going to contemplate this and contact me if she wants to proceed with fistula creation surgery.   Annamarie Major, MD [facility] [phone] Pager [phone]   No interval change.  Still not on HD. Denies chest pain, Has SOB with ambulation and LE edema  CV:RRR Pulm: CTA LE Edema  Plan left arm AVF.  Risks and benefits again discussed   Annamarie Major

## 2016-01-23 NOTE — Transfer of Care (Signed)
Immediate Anesthesia Transfer of Care Note  Patient: [name]  Procedure(s) Performed: Procedure(s): LEFT RADIOCEPHALIC  ARTERIOVENOUS (AV) FISTULA CREATION (Left)  Patient Location: PACU  Anesthesia Type:MAC  Level of Consciousness: awake, alert  and oriented  Airway & Oxygen Therapy: Patient Spontanous Breathing and Patient connected to face mask oxygen  Post-op Assessment: Report given to RN, Post -op Vital signs reviewed and stable and Patient moving all extremities  Post vital signs: Reviewed and stable  Last Vitals:  Vitals:   01/23/16 0825  BP: (!) 166/62  Pulse: (!) 51  Resp: 18  Temp: 37.2 C    Last Pain:  Vitals:   01/23/16 0825  TempSrc: Oral         Complications: No apparent anesthesia complications

## 2016-01-24 NOTE — Op Note (Signed)
    Patient name: [name] MRN: [id number] DOB: Jun 20, 1940 Sex: female  01/23/2016 Pre-operative Diagnosis: Renal insufficiency Post-operative diagnosis:  Same Surgeon:  Annamarie Major Assistants:  Silva Bandy Procedure:   Left radiocephalic fistula Anesthesia:  Gen. Blood Loss:  See anesthesia record Specimens:  None  Findings:  3 mm cephalic vein.  1. 5-2 millimeter radial artery  Indications:  The patient is not yet on dialysis.  Her renal insufficiency has progressed to where dialysis access is recommended.  Preoperative vein mapping showed an adequate cephalic vein.  Procedure:  The patient was identified in the holding area and taken to [place] [facility]  The patient was then placed supine on the table. general anesthesia was administered.  The patient was prepped and draped in the usual sterile fashion.  A time out was called and antibiotics were administered.  Ultrasound was used to map the course of the cephalic vein in the upper arm.  I felt that her cephalic vein at the level of the wrist was suitable for fistula creation.  A longitudinal incision was made proximal to the wrist.  Cautery was used to the subcutaneous tissue.  I exposed the cephalic vein.  This was a 2. 5-3 millimeter vein.  It was fully mobilized.  Side branches were ligated between silk ties.  I then dissected out the radial artery.  This was a disease-free artery measuring approximately 1. 5-2 millimeters.  Once adequate exposure was performed, I ligated the vein distally after marking it for orientation.  The vein was then distended with heparinized saline and it distended nicely.  Next, the artery was occluded with Serafin clamps.  A #11 blade was used to make an arteriotomy which was extended longitudinally with Potts scissors.  The vein was cut to the appropriate length and then spatulated to fit the size of the arteriotomy.  A running anastomosis was created with 6-0 Prolene.  Prior to completion, the appropriate  flushing maneuvers were performed and the anastomosis was completed.  There was adequate Doppler signal within the fistula.  There was a dampened radial artery waveform and a brisk ulnar artery Doppler signal.  Hemostasis was then achieved.  I inspected the course of the vein to make sure there were no kinks.  The skin incision was then closed with 2 layers of running 3-0 Vicryl followed by Dermabond.  There were no immediate complications.   Disposition:  To PACU in stable condition.   Theotis Burrow, M.D. [facility] [place] Office: [phone] Pager:  [phone]

## 2016-01-25 NOTE — Anesthesia Postprocedure Evaluation (Addendum)
Anesthesia Post Note  Patient: [name]  Procedure(s) Performed: Procedure(s) (LRB): LEFT RADIOCEPHALIC  ARTERIOVENOUS (AV) FISTULA CREATION (Left)  Patient location during evaluation: PACU Anesthesia Type: MAC Level of consciousness: awake, awake and alert and oriented Pain management: pain level controlled Vital Signs Assessment: post-procedure vital signs reviewed and stable Respiratory status: spontaneous breathing, nonlabored ventilation and respiratory function stable Cardiovascular status: blood pressure returned to baseline Anesthetic complications: no    Last Vitals:  Vitals:   01/23/16 1315 01/23/16 1348  BP:  (!) 168/78  Pulse: (!) 43 87  Resp: 14   Temp:      Last Pain:  Vitals:   01/23/16 0825  TempSrc: Oral                 Jasai Sorg COKER

## 2016-01-26 ENCOUNTER — Telehealth: Payer: Self-pay | Admitting: Surgery

## 2016-01-26 NOTE — Telephone Encounter (Signed)
-----   Message from Mena Goes, RN sent at 01/23/2016  1:29 PM EDT ----- Regarding: schedule   ----- Message ----- From: Alvia Grove, PA-C Sent: 01/23/2016   1:00 PM To: Vvs Charge Pool  S/p left radial-cephalic fistula 9/61/16  F/u with Dr. Trula Slade in 6 weeks with duplex  Thanks Maudie Mercury

## 2016-01-26 NOTE — Telephone Encounter (Signed)
mailed appt letter to home address on 01/26/16 beg appt on 11/6 @ 1 pm f/u with doc at 2:15

## 2016-01-27 ENCOUNTER — Encounter (HOSPITAL_COMMUNITY): Payer: Self-pay | Admitting: Surgery

## 2016-01-30 ENCOUNTER — Encounter (HOSPITAL_COMMUNITY)
Admission: RE | Admit: 2016-01-30 | Discharge: 2016-01-30 | Disposition: A | Discharge status: Home / Self Care | Payer: Medicare Other | Source: Ambulatory Visit | Attending: Nephrology | Admitting: Nephrology

## 2016-01-30 DIAGNOSIS — N184 Chronic kidney disease, stage 4 (severe): Secondary | ICD-10-CM | POA: Diagnosis not present

## 2016-01-30 DIAGNOSIS — D638 Anemia in other chronic diseases classified elsewhere: Secondary | ICD-10-CM

## 2016-01-30 LAB — IRON AND TIBC
Iron: 79 ug/dL (ref 28–170)
SATURATION RATIOS: 33 % — AB (ref 10.4–31.8)
TIBC: 237 ug/dL — ABNORMAL LOW (ref 250–450)
UIBC: 158 ug/dL

## 2016-01-30 LAB — POCT HEMOGLOBIN-HEMACUE: HEMOGLOBIN: 11.3 g/dL — AB (ref 12.0–15.0)

## 2016-01-30 LAB — FERRITIN: FERRITIN: 522 ng/mL — AB (ref 11–307)

## 2016-01-30 MED ORDER — EPOETIN ALFA 40000 UNIT/ML IJ SOLN: 30000.0000 [IU] | INTRAMUSCULAR | Status: DC

## 2016-01-30 MED ORDER — EPOETIN ALFA 10000 UNIT/ML IJ SOLN
INTRAMUSCULAR | Status: AC
  Administered 2016-01-30: 10000 [IU] via SUBCUTANEOUS
  Filled 2016-01-30: qty 1

## 2016-01-30 MED ORDER — EPOETIN ALFA 20000 UNIT/ML IJ SOLN
INTRAMUSCULAR | Status: AC
  Administered 2016-01-30: 20000 [IU] via SUBCUTANEOUS
  Filled 2016-01-30: qty 1

## 2016-02-13 ENCOUNTER — Encounter (HOSPITAL_COMMUNITY)
Admission: RE | Admit: 2016-02-13 | Discharge: 2016-02-13 | Disposition: A | Discharge status: Home / Self Care | Payer: Medicare Other | Source: Ambulatory Visit | Attending: Nephrology | Admitting: Nephrology

## 2016-02-13 DIAGNOSIS — Z5181 Encounter for therapeutic drug level monitoring: Secondary | ICD-10-CM | POA: Diagnosis not present

## 2016-02-13 DIAGNOSIS — D631 Anemia in chronic kidney disease: Secondary | ICD-10-CM | POA: Diagnosis not present

## 2016-02-13 DIAGNOSIS — D638 Anemia in other chronic diseases classified elsewhere: Secondary | ICD-10-CM

## 2016-02-13 DIAGNOSIS — N184 Chronic kidney disease, stage 4 (severe): Secondary | ICD-10-CM | POA: Diagnosis not present

## 2016-02-13 DIAGNOSIS — Z79899 Other long term (current) drug therapy: Secondary | ICD-10-CM | POA: Insufficient documentation

## 2016-02-13 DIAGNOSIS — D509 Iron deficiency anemia, unspecified: Secondary | ICD-10-CM | POA: Insufficient documentation

## 2016-02-13 LAB — POCT HEMOGLOBIN-HEMACUE: Hemoglobin: 11.3 g/dL — ABNORMAL LOW (ref 12.0–15.0)

## 2016-02-13 MED ORDER — EPOETIN ALFA 20000 UNIT/ML IJ SOLN
INTRAMUSCULAR | Status: AC
  Administered 2016-02-13: 20000 [IU] via SUBCUTANEOUS
  Filled 2016-02-13: qty 1

## 2016-02-13 MED ORDER — EPOETIN ALFA 10000 UNIT/ML IJ SOLN
INTRAMUSCULAR | Status: AC
  Administered 2016-02-13: 10000 [IU] via SUBCUTANEOUS
  Filled 2016-02-13: qty 1

## 2016-02-13 MED ORDER — EPOETIN ALFA 40000 UNIT/ML IJ SOLN: 30000.0000 [IU] | INTRAMUSCULAR | Status: DC

## 2016-02-27 ENCOUNTER — Encounter (HOSPITAL_COMMUNITY)
Admission: RE | Admit: 2016-02-27 | Discharge: 2016-02-27 | Disposition: A | Discharge status: Home / Self Care | Payer: Medicare Other | Source: Ambulatory Visit | Attending: Nephrology | Admitting: Nephrology

## 2016-02-27 DIAGNOSIS — N184 Chronic kidney disease, stage 4 (severe): Secondary | ICD-10-CM | POA: Diagnosis not present

## 2016-02-27 DIAGNOSIS — D638 Anemia in other chronic diseases classified elsewhere: Secondary | ICD-10-CM

## 2016-02-27 LAB — POCT HEMOGLOBIN-HEMACUE: Hemoglobin: 11.8 g/dL — ABNORMAL LOW (ref 12.0–15.0)

## 2016-02-27 LAB — IRON AND TIBC
IRON: 98 ug/dL (ref 28–170)
Saturation Ratios: 42 % — ABNORMAL HIGH (ref 10.4–31.8)
TIBC: 235 ug/dL — AB (ref 250–450)
UIBC: 137 ug/dL

## 2016-02-27 LAB — FERRITIN: Ferritin: 553 ng/mL — ABNORMAL HIGH (ref 11–307)

## 2016-02-27 MED ORDER — EPOETIN ALFA 20000 UNIT/ML IJ SOLN
INTRAMUSCULAR | Status: AC
  Administered 2016-02-27: 20000 [IU] via SUBCUTANEOUS
  Filled 2016-02-27: qty 1

## 2016-02-27 MED ORDER — EPOETIN ALFA 10000 UNIT/ML IJ SOLN
INTRAMUSCULAR | Status: AC
  Administered 2016-02-27: 10000 [IU] via SUBCUTANEOUS
  Filled 2016-02-27: qty 1

## 2016-02-27 MED ORDER — EPOETIN ALFA 40000 UNIT/ML IJ SOLN: 30000.0000 [IU] | INTRAMUSCULAR | Status: DC

## 2016-03-03 ENCOUNTER — Encounter: Payer: Self-pay | Admitting: Surgery

## 2016-03-04 ENCOUNTER — Other Ambulatory Visit: Payer: Self-pay

## 2016-03-08 ENCOUNTER — Ambulatory Visit (INDEPENDENT_AMBULATORY_CARE_PROVIDER_SITE_OTHER): Payer: Medicare Other | Admitting: Surgery

## 2016-03-08 ENCOUNTER — Other Ambulatory Visit: Payer: Self-pay

## 2016-03-08 ENCOUNTER — Ambulatory Visit (HOSPITAL_COMMUNITY)
Admission: RE | Admit: 2016-03-08 | Discharge: 2016-03-08 | Disposition: A | Discharge status: Home / Self Care | Payer: Medicare Other | Source: Ambulatory Visit | Attending: Surgery | Admitting: Surgery

## 2016-03-08 ENCOUNTER — Encounter (HOSPITAL_COMMUNITY): Payer: Self-pay | Admitting: *Deleted

## 2016-03-08 ENCOUNTER — Encounter: Payer: Self-pay | Admitting: Surgery

## 2016-03-08 VITALS — BP 152/78 | HR 58 | Temp 97.3°F | Resp 24 | Ht 61.0 in | Wt 218.0 lb

## 2016-03-08 DIAGNOSIS — N186 End stage renal disease: Secondary | ICD-10-CM | POA: Insufficient documentation

## 2016-03-08 DIAGNOSIS — Z4931 Encounter for adequacy testing for hemodialysis: Secondary | ICD-10-CM | POA: Diagnosis not present

## 2016-03-08 DIAGNOSIS — Y832 Surgical operation with anastomosis, bypass or graft as the cause of abnormal reaction of the patient, or of later complication, without mention of misadventure at the time of the procedure: Secondary | ICD-10-CM | POA: Insufficient documentation

## 2016-03-08 DIAGNOSIS — T82898A Other specified complication of vascular prosthetic devices, implants and grafts, initial encounter: Secondary | ICD-10-CM | POA: Diagnosis not present

## 2016-03-08 DIAGNOSIS — Z992 Dependence on renal dialysis: Secondary | ICD-10-CM

## 2016-03-08 MED ORDER — OXYCODONE HCL 5 MG PO TABS: 5.0000 mg | ORAL_TABLET | Freq: Once | ORAL | Status: DC | PRN

## 2016-03-08 MED ORDER — OXYCODONE HCL 5 MG/5ML PO SOLN: 5.0000 mg | Freq: Once | ORAL | Status: DC | PRN

## 2016-03-08 MED ORDER — ONDANSETRON HCL 4 MG/2ML IJ SOLN: 4.0000 mg | Freq: Once | INTRAMUSCULAR | Status: DC | PRN

## 2016-03-08 MED ORDER — FENTANYL CITRATE (PF) 100 MCG/2ML IJ SOLN: 25.0000 ug | INTRAMUSCULAR | Status: DC | PRN

## 2016-03-08 NOTE — Progress Notes (Signed)
   Patient name: [name] MRN: [id number] DOB: Mar 15, 1941 Sex: female  REASON FOR VISIT: post op  HPI: [name] is a 75 y.o. female who is status post left radiocephalic fistula creation on 01/23/2016.  This has occluded.  She is on the schedule tomorrow for a dialysis catheter.  Current Outpatient Prescriptions  Medication Sig Dispense Refill  . calcitRIOL (ROCALTROL) 0.5 MCG capsule Take 1 capsule by mouth 2 (two) times daily.  3  . calcium acetate (PHOSLO) 667 MG capsule Take 667 mg by mouth 3 (three) times daily with meals.   6  . furosemide (LASIX) 80 MG tablet Take 160 mg by mouth 2 (two) times daily.    Marland Kitchen losartan (COZAAR) 50 MG tablet Take 1 tablet by mouth at bedtime.  6  . nebivolol (BYSTOLIC) 10 MG tablet Take 10 mg by mouth at bedtime.      No current facility-administered medications for this visit.     REVIEW OF SYSTEMS:  [X]  denotes positive finding, [ ]  denotes negative finding Cardiac  Comments:  Chest pain or chest pressure:    Shortness of breath upon exertion:    Short of breath when lying flat:    Irregular heart rhythm:    Constitutional    Fever or chills:      PHYSICAL EXAM: Vitals:   03/08/16 1426 03/08/16 1429  BP: (!) 148/72 (!) 152/78  Pulse: (!) 54 (!) 58  Resp: (!) 24   Temp: 97.3 F (36.3 C)   TempSrc: Oral   SpO2: 98%   Weight: 218 lb (98.9 kg)   Height: 5\' 1"  (1.549 m)     GENERAL: The patient is a well-nourished female, in no acute distress. The vital signs are documented above. CARDIOVASCULAR: There is a regular rate and rhythm. PULMONARY: There is good air exchange bilaterally without wheezing or rales. I evaluated the left cephalic vein in the arm.  This does appear to be patent and of adequate diameter in the upper arm.  Duplex shows occluded fistula.  Her old vein mapping study showed that the left cephalic vein measures between 0.35 and 0.55 cm.  MEDICAL ISSUES: The patient is  scheduled for dialysis catheter tomorrow.  Her left radiocephalic fistula is occluded.  I encouraged her to have a brachiocephalic fistula created tomorrow at the time of her catheter.  I used the sono site to confirm that the cephalic vein was of adequate diameter and patent in the upper arm.  I think she'll be a good candidate for brachiocephalic fistula.  She wishes to proceed with fistula and catheter tomorrow  Annamarie Major, MD [facility] [phone] Pager [phone]

## 2016-03-08 NOTE — Progress Notes (Addendum)
[name] reports that she sometimes has chest pain when she turns over in bed- "not every night, and then I igo to sleep and do not notice any pain."  Patients describes pain as "sharp and takes her breath' and then goes away.  Patient stated that she has told Dr Deterding about it in the past.  Patient denies shortness of breath with the pain or lightheadedness or nausea.  Patient states she has not experience the pain this week. Patient reports that she may not have anyone to drive her home tomorrow, she will check with her sister.  Patient reports that she has an appointment with surgeon today , I instructed patient to inform the Dr of chest pain and if she is unable to arrange transportion and someone to stay with her after surgery .  Patient reports that she had a echocardiogram, she thinks; it would be over 10 years ago, at the "[facility]".  Patient said she did not need follow up. Chart given to anesthesia PA/NP to review.

## 2016-03-09 ENCOUNTER — Ambulatory Visit (HOSPITAL_COMMUNITY): Payer: Medicare Other | Admitting: Certified Registered Nurse Anesthetist

## 2016-03-09 ENCOUNTER — Encounter (HOSPITAL_COMMUNITY): Payer: Self-pay | Admitting: *Deleted

## 2016-03-09 ENCOUNTER — Ambulatory Visit (HOSPITAL_COMMUNITY): Payer: Medicare Other

## 2016-03-09 ENCOUNTER — Encounter (HOSPITAL_COMMUNITY)
Admission: RE | Disposition: A | Discharge status: Home / Self Care | Payer: Self-pay | Source: Ambulatory Visit | Attending: Vascular Surgery

## 2016-03-09 ENCOUNTER — Ambulatory Visit (HOSPITAL_COMMUNITY)
Admission: RE | Admit: 2016-03-09 | Discharge: 2016-03-09 | Disposition: A | Discharge status: Home / Self Care | Payer: Medicare Other | Source: Ambulatory Visit | Attending: Vascular Surgery | Admitting: Vascular Surgery

## 2016-03-09 DIAGNOSIS — I120 Hypertensive chronic kidney disease with stage 5 chronic kidney disease or end stage renal disease: Secondary | ICD-10-CM | POA: Diagnosis not present

## 2016-03-09 DIAGNOSIS — N186 End stage renal disease: Secondary | ICD-10-CM | POA: Diagnosis present

## 2016-03-09 DIAGNOSIS — Z79899 Other long term (current) drug therapy: Secondary | ICD-10-CM | POA: Insufficient documentation

## 2016-03-09 DIAGNOSIS — Z992 Dependence on renal dialysis: Secondary | ICD-10-CM | POA: Insufficient documentation

## 2016-03-09 DIAGNOSIS — Y832 Surgical operation with anastomosis, bypass or graft as the cause of abnormal reaction of the patient, or of later complication, without mention of misadventure at the time of the procedure: Secondary | ICD-10-CM | POA: Diagnosis not present

## 2016-03-09 DIAGNOSIS — T82898A Other specified complication of vascular prosthetic devices, implants and grafts, initial encounter: Secondary | ICD-10-CM | POA: Insufficient documentation

## 2016-03-09 DIAGNOSIS — N185 Chronic kidney disease, stage 5: Secondary | ICD-10-CM | POA: Diagnosis not present

## 2016-03-09 DIAGNOSIS — Z419 Encounter for procedure for purposes other than remedying health state, unspecified: Secondary | ICD-10-CM

## 2016-03-09 HISTORY — PX: AV FISTULA PLACEMENT: SHX1204

## 2016-03-09 HISTORY — PX: INSERTION OF DIALYSIS CATHETER: SHX1324

## 2016-03-09 HISTORY — DX: Malignant (primary) neoplasm, unspecified: C80.1

## 2016-03-09 HISTORY — DX: Unspecified osteoarthritis, unspecified site: M19.90

## 2016-03-09 LAB — POCT I-STAT 4, (NA,K, GLUC, HGB,HCT)
Glucose, Bld: 98 mg/dL (ref 65–99)
HCT: 36 % (ref 36.0–46.0)
Hemoglobin: 12.2 g/dL (ref 12.0–15.0)
POTASSIUM: 4.6 mmol/L (ref 3.5–5.1)
SODIUM: 145 mmol/L (ref 135–145)

## 2016-03-09 LAB — GLUCOSE, CAPILLARY: GLUCOSE-CAPILLARY: 86 mg/dL (ref 65–99)

## 2016-03-09 SURGERY — INSERTION OF DIALYSIS CATHETER
Anesthesia: Monitor Anesthesia Care | Site: Neck

## 2016-03-09 MED ORDER — EPHEDRINE SULFATE 50 MG/ML IJ SOLN
INTRAMUSCULAR | Status: DC | PRN
  Administered 2016-03-09: 10 mg via INTRAVENOUS

## 2016-03-09 MED ORDER — CHLORHEXIDINE GLUCONATE CLOTH 2 % EX PADS: 6.0000 | MEDICATED_PAD | Freq: Once | CUTANEOUS | Status: DC

## 2016-03-09 MED ORDER — HEPARIN SODIUM (PORCINE) 1000 UNIT/ML IJ SOLN
INTRAMUSCULAR | Status: DC | PRN
  Administered 2016-03-09: 3.4 mL

## 2016-03-09 MED ORDER — FENTANYL CITRATE (PF) 100 MCG/2ML IJ SOLN
INTRAMUSCULAR | Status: AC
  Filled 2016-03-09: qty 2

## 2016-03-09 MED ORDER — LIDOCAINE HCL (PF) 1 % IJ SOLN
INTRAMUSCULAR | Status: DC | PRN
  Administered 2016-03-09 (×2): 30 mL

## 2016-03-09 MED ORDER — PHENYLEPHRINE HCL 10 MG/ML IJ SOLN
INTRAMUSCULAR | Status: DC | PRN
  Administered 2016-03-09 (×3): 80 ug via INTRAVENOUS
  Administered 2016-03-09: 120 ug via INTRAVENOUS
  Administered 2016-03-09 (×2): 80 ug via INTRAVENOUS

## 2016-03-09 MED ORDER — OXYCODONE-ACETAMINOPHEN 5-325 MG PO TABS: 1.0000 | ORAL_TABLET | Freq: Four times a day (QID) | ORAL | 0 refills | Status: DC | PRN

## 2016-03-09 MED ORDER — HEPARIN SODIUM (PORCINE) 5000 UNIT/ML IJ SOLN
INTRAMUSCULAR | Status: DC | PRN
  Administered 2016-03-09: 500 mL

## 2016-03-09 MED ORDER — LIDOCAINE HCL (PF) 1 % IJ SOLN
INTRAMUSCULAR | Status: AC
  Filled 2016-03-09: qty 30

## 2016-03-09 MED ORDER — FENTANYL CITRATE (PF) 100 MCG/2ML IJ SOLN
INTRAMUSCULAR | Status: DC | PRN
  Administered 2016-03-09 (×2): 25 ug via INTRAVENOUS
  Administered 2016-03-09: 50 ug via INTRAVENOUS

## 2016-03-09 MED ORDER — ONDANSETRON HCL 4 MG/2ML IJ SOLN
4.0000 mg | Freq: Once | INTRAMUSCULAR | Status: AC | PRN
  Administered 2016-03-09: 4 mg via INTRAVENOUS

## 2016-03-09 MED ORDER — 0.9 % SODIUM CHLORIDE (POUR BTL) OPTIME
TOPICAL | Status: DC | PRN
  Administered 2016-03-09: 1000 mL

## 2016-03-09 MED ORDER — OXYCODONE HCL 5 MG/5ML PO SOLN: 5.0000 mg | Freq: Once | ORAL | Status: DC | PRN

## 2016-03-09 MED ORDER — HEPARIN SODIUM (PORCINE) 1000 UNIT/ML IJ SOLN
INTRAMUSCULAR | Status: AC
  Filled 2016-03-09: qty 1

## 2016-03-09 MED ORDER — PROPOFOL 500 MG/50ML IV EMUL
INTRAVENOUS | Status: DC | PRN
  Administered 2016-03-09: 100 ug/kg/min via INTRAVENOUS

## 2016-03-09 MED ORDER — OXYCODONE HCL 5 MG PO TABS: 5.0000 mg | ORAL_TABLET | Freq: Once | ORAL | Status: DC | PRN

## 2016-03-09 MED ORDER — SODIUM CHLORIDE 0.9 % IV SOLN
INTRAVENOUS | Status: DC
  Administered 2016-03-09 (×2): via INTRAVENOUS

## 2016-03-09 MED ORDER — ONDANSETRON HCL 4 MG/2ML IJ SOLN
INTRAMUSCULAR | Status: AC
  Filled 2016-03-09: qty 2

## 2016-03-09 MED ORDER — DEXTROSE 5 % IV SOLN
1.5000 g | INTRAVENOUS | Status: AC
  Administered 2016-03-09: 1.5 g via INTRAVENOUS
  Filled 2016-03-09: qty 1.5

## 2016-03-09 MED ORDER — LIDOCAINE HCL (CARDIAC) 20 MG/ML IV SOLN
INTRAVENOUS | Status: DC | PRN
  Administered 2016-03-09: 60 mg via INTRAVENOUS

## 2016-03-09 MED ORDER — FENTANYL CITRATE (PF) 100 MCG/2ML IJ SOLN: 25.0000 ug | INTRAMUSCULAR | Status: DC | PRN

## 2016-03-09 SURGICAL SUPPLY — 67 items
ADH SKN CLS APL DERMABOND .7 (GAUZE/BANDAGES/DRESSINGS) ×2
ADH SKN CLS LQ APL DERMABOND (GAUZE/BANDAGES/DRESSINGS) ×2
AGENT HMST SPONGE THK3/8 (HEMOSTASIS)
ARMBAND PINK RESTRICT EXTREMIT (MISCELLANEOUS) ×8 IMPLANT
BAG DECANTER FOR FLEXI CONT (MISCELLANEOUS) ×4 IMPLANT
BIOPATCH RED 1 DISK 7.0 (GAUZE/BANDAGES/DRESSINGS) ×3 IMPLANT
BIOPATCH RED 1IN DISK 7.0MM (GAUZE/BANDAGES/DRESSINGS) ×1
CANISTER SUCTION 2500CC (MISCELLANEOUS) ×4 IMPLANT
CATH PALINDROME RT-P 15FX19CM (CATHETERS) IMPLANT
CATH PALINDROME RT-P 15FX23CM (CATHETERS) ×3 IMPLANT
CATH PALINDROME RT-P 15FX28CM (CATHETERS) IMPLANT
CATH PALINDROME RT-P 15FX55CM (CATHETERS) IMPLANT
CATH STRAIGHT 5FR 65CM (CATHETERS) IMPLANT
CLIP TI MEDIUM 6 (CLIP) ×4 IMPLANT
CLIP TI WIDE RED SMALL 6 (CLIP) ×4 IMPLANT
COVER MAYO STAND STRL (DRAPES) ×2 IMPLANT
COVER PROBE W GEL 5X96 (DRAPES) ×4 IMPLANT
COVER SURGICAL LIGHT HANDLE (MISCELLANEOUS) ×4 IMPLANT
DECANTER SPIKE VIAL GLASS SM (MISCELLANEOUS) ×4 IMPLANT
DERMABOND ADHESIVE PROPEN (GAUZE/BANDAGES/DRESSINGS) ×2
DERMABOND ADVANCED (GAUZE/BANDAGES/DRESSINGS) ×2
DERMABOND ADVANCED .7 DNX12 (GAUZE/BANDAGES/DRESSINGS) ×2 IMPLANT
DERMABOND ADVANCED .7 DNX6 (GAUZE/BANDAGES/DRESSINGS) IMPLANT
DRAPE C-ARM 42X72 X-RAY (DRAPES) ×4 IMPLANT
DRAPE CHEST BREAST 15X10 FENES (DRAPES) ×4 IMPLANT
ELECT REM PT RETURN 9FT ADLT (ELECTROSURGICAL) ×4
ELECTRODE REM PT RTRN 9FT ADLT (ELECTROSURGICAL) ×2 IMPLANT
GAUZE SPONGE 2X2 8PLY STRL LF (GAUZE/BANDAGES/DRESSINGS) IMPLANT
GAUZE SPONGE 4X4 16PLY XRAY LF (GAUZE/BANDAGES/DRESSINGS) ×4 IMPLANT
GLOVE BIO SURGEON STRL SZ7 (GLOVE) ×4 IMPLANT
GLOVE BIOGEL PI IND STRL 6.5 (GLOVE) IMPLANT
GLOVE BIOGEL PI IND STRL 7.0 (GLOVE) IMPLANT
GLOVE BIOGEL PI IND STRL 7.5 (GLOVE) ×2 IMPLANT
GLOVE BIOGEL PI INDICATOR 6.5 (GLOVE) ×8
GLOVE BIOGEL PI INDICATOR 7.0 (GLOVE) ×2
GLOVE BIOGEL PI INDICATOR 7.5 (GLOVE) ×2
GLOVE SURG SS PI 6.5 STRL IVOR (GLOVE) ×4 IMPLANT
GOWN STRL REUS W/ TWL LRG LVL3 (GOWN DISPOSABLE) ×6 IMPLANT
GOWN STRL REUS W/TWL LRG LVL3 (GOWN DISPOSABLE) ×20
HEMOSTAT SPONGE AVITENE ULTRA (HEMOSTASIS) IMPLANT
KIT BASIN OR (CUSTOM PROCEDURE TRAY) ×4 IMPLANT
KIT ROOM TURNOVER OR (KITS) ×4 IMPLANT
NDL 18GX1X1/2 (RX/OR ONLY) (NEEDLE) ×1 IMPLANT
NDL HYPO 25GX1X1/2 BEV (NEEDLE) ×2 IMPLANT
NEEDLE 18GX1X1/2 (RX/OR ONLY) (NEEDLE) ×4 IMPLANT
NEEDLE HYPO 25GX1X1/2 BEV (NEEDLE) ×4 IMPLANT
NS IRRIG 1000ML POUR BTL (IV SOLUTION) ×4 IMPLANT
PACK CV ACCESS (CUSTOM PROCEDURE TRAY) ×4 IMPLANT
PACK SURGICAL SETUP 50X90 (CUSTOM PROCEDURE TRAY) ×4 IMPLANT
PAD ARMBOARD 7.5X6 YLW CONV (MISCELLANEOUS) ×8 IMPLANT
SET MICROPUNCTURE 5F STIFF (MISCELLANEOUS) IMPLANT
SOAP 2 % CHG 4 OZ (WOUND CARE) ×4 IMPLANT
SPONGE GAUZE 2X2 STER 10/PKG (GAUZE/BANDAGES/DRESSINGS)
SUT ETHILON 3 0 PS 1 (SUTURE) ×4 IMPLANT
SUT MNCRL AB 4-0 PS2 18 (SUTURE) ×4 IMPLANT
SUT PROLENE 6 0 BV (SUTURE) IMPLANT
SUT PROLENE 7 0 BV 1 (SUTURE) ×4 IMPLANT
SUT VIC AB 3-0 SH 27 (SUTURE) ×4
SUT VIC AB 3-0 SH 27X BRD (SUTURE) ×2 IMPLANT
SYR 20CC LL (SYRINGE) ×8 IMPLANT
SYR 3ML LL SCALE MARK (SYRINGE) ×4 IMPLANT
SYR 5ML LL (SYRINGE) ×4 IMPLANT
SYR CONTROL 10ML LL (SYRINGE) ×2 IMPLANT
SYRINGE 10CC LL (SYRINGE) ×4 IMPLANT
UNDERPAD 30X30 (UNDERPADS AND DIAPERS) ×4 IMPLANT
WATER STERILE IRR 1000ML POUR (IV SOLUTION) ×4 IMPLANT
WIRE AMPLATZ SS-J .035X180CM (WIRE) IMPLANT

## 2016-03-09 NOTE — Transfer of Care (Signed)
Immediate Anesthesia Transfer of Care Note  Patient: [name]  Procedure(s) Performed: Procedure(s): INSERTION OF DIALYSIS CATHETER (N/A) ARTERIOVENOUS (AV) FISTULA CREATION-LEFT UPPER ARM (Left)  Patient Location: PACU  Anesthesia Type:MAC  Level of Consciousness: awake, alert , oriented and patient cooperative  Airway & Oxygen Therapy: Patient Spontanous Breathing  Post-op Assessment: Report given to RN and Post -op Vital signs reviewed and stable  Post vital signs: Reviewed and stable  Last Vitals:  Vitals:   03/09/16 0823  BP: (!) 178/79  Pulse: (!) 56  Resp: 20  Temp: 36.5 C    Last Pain:  Vitals:   03/09/16 0823  TempSrc: Oral      Patients Stated Pain Goal: 9 (04/54/09 8119)  Complications: No apparent anesthesia complications

## 2016-03-09 NOTE — Anesthesia Procedure Notes (Signed)
Procedure Name: MAC Date/Time: 03/09/2016 9:37 AM Performed by: Salli Quarry Christpoher Sievers Pre-anesthesia Checklist: Patient identified, Emergency Drugs available, Suction available and Patient being monitored Patient Re-evaluated:Patient Re-evaluated prior to inductionOxygen Delivery Method: Simple face mask

## 2016-03-09 NOTE — Anesthesia Postprocedure Evaluation (Signed)
Anesthesia Post Note  Patient: [name]  Procedure(s) Performed: Procedure(s) (LRB): INSERTION OF DIALYSIS CATHETER (N/A) ARTERIOVENOUS (AV) FISTULA CREATION-LEFT UPPER ARM (Left)  Patient location during evaluation: PACU Anesthesia Type: MAC Level of consciousness: awake and alert Pain management: pain level controlled Vital Signs Assessment: post-procedure vital signs reviewed and stable Respiratory status: spontaneous breathing, nonlabored ventilation and respiratory function stable Cardiovascular status: stable and blood pressure returned to baseline Anesthetic complications: no    Last Vitals:  Vitals:   03/09/16 1200 03/09/16 1205  BP: (!) 189/83 (!) 177/81  Pulse: (!) 58 (!) 56  Resp: 13 12  Temp:      Last Pain:  Vitals:   03/09/16 0823  TempSrc: Oral                 Phala Schraeder,W. EDMOND

## 2016-03-09 NOTE — Interval H&P Note (Signed)
[facility] of   History and Physical Update  The patient was interviewed and re-examined.  The patient's previous History and Physical has been reviewed and is unchanged from Dr. Stephens Shire consult.  There is no change in the plan of care: [facility] placement, L BC AVF.   Risk, benefits, and alternatives to access surgery were discussed.    The patient is aware the risks include but are not limited to: bleeding, infection, steal syndrome, nerve damage, ischemic monomelic neuropathy, failure to mature, need for additional procedures, death and stroke.    The patient is aware the risks of tunneled dialysis catheter placement include but are not limited to: bleeding, infection, central venous injury, pneumothorax, possible venous stenosis, possible malpositioning in the venous system, and possible infections related to long-term catheter presence.   The patient was aware of these risks and agreed to proceed.   Adele Barthel, MD [facility] [place] Office: [phone] Pager: [phone]  03/09/2016, 8:25 AM

## 2016-03-09 NOTE — Anesthesia Preprocedure Evaluation (Signed)
Anesthesia Evaluation  Patient identified by MRN, date of birth, ID band Patient awake    Reviewed: Allergy & Precautions, H&P , NPO status , Patient's Chart, lab work & pertinent test results, reviewed documented beta blocker date and time   Airway Mallampati: II  TM Distance: >3 FB Neck ROM: Full    Dental no notable dental hx. (+) Edentulous Upper, Edentulous Lower, Dental Advisory Given   Pulmonary neg pulmonary ROS,    Pulmonary exam normal breath sounds clear to auscultation       Cardiovascular hypertension, Pt. on medications and Pt. on home beta blockers  Rhythm:Regular Rate:Normal     Neuro/Psych negative neurological ROS  negative psych ROS   GI/Hepatic negative GI ROS, Neg liver ROS,   Endo/Other  negative endocrine ROS  Renal/GU ESRF and DialysisRenal disease  negative genitourinary   Musculoskeletal  (+) Arthritis , Osteoarthritis,    Abdominal   Peds  Hematology negative hematology ROS (+) anemia ,   Anesthesia Other Findings   Reproductive/Obstetrics negative OB ROS                             Anesthesia Physical Anesthesia Plan  ASA: III  Anesthesia Plan: MAC   Post-op Pain Management:    Induction: Intravenous  Airway Management Planned: Simple Face Mask  Additional Equipment:   Intra-op Plan:   Post-operative Plan:   Informed Consent: I have reviewed the patients History and Physical, chart, labs and discussed the procedure including the risks, benefits and alternatives for the proposed anesthesia with the patient or authorized representative who has indicated his/her understanding and acceptance.   Dental advisory given  Plan Discussed with: CRNA  Anesthesia Plan Comments:         Anesthesia Quick Evaluation

## 2016-03-09 NOTE — H&P (View-Only) (Signed)
   Patient name: [name] MRN: [id number] DOB: [birth year] Sex: female  REASON FOR VISIT: post op  HPI: [name] is a 75 y.o. female who is status post left radiocephalic fistula creation on 01/23/2016.  This has occluded.  She is on the schedule tomorrow for a dialysis catheter.  Current Outpatient Prescriptions  Medication Sig Dispense Refill  . calcitRIOL (ROCALTROL) 0.5 MCG capsule Take 1 capsule by mouth 2 (two) times daily.  3  . calcium acetate (PHOSLO) 667 MG capsule Take 667 mg by mouth 3 (three) times daily with meals.   6  . furosemide (LASIX) 80 MG tablet Take 160 mg by mouth 2 (two) times daily.    Marland Kitchen losartan (COZAAR) 50 MG tablet Take 1 tablet by mouth at bedtime.  6  . nebivolol (BYSTOLIC) 10 MG tablet Take 10 mg by mouth at bedtime.      No current facility-administered medications for this visit.     REVIEW OF SYSTEMS:  [X]  denotes positive finding, [ ]  denotes negative finding Cardiac  Comments:  Chest pain or chest pressure:    Shortness of breath upon exertion:    Short of breath when lying flat:    Irregular heart rhythm:    Constitutional    Fever or chills:      PHYSICAL EXAM: Vitals:   03/08/16 1426 03/08/16 1429  BP: (!) 148/72 (!) 152/78  Pulse: (!) 54 (!) 58  Resp: (!) 24   Temp: 97.3 F (36.3 C)   TempSrc: Oral   SpO2: 98%   Weight: 218 lb (98.9 kg)   Height: 5\' 1"  (1.549 m)     GENERAL: The patient is a well-nourished female, in no acute distress. The vital signs are documented above. CARDIOVASCULAR: There is a regular rate and rhythm. PULMONARY: There is good air exchange bilaterally without wheezing or rales. I evaluated the left cephalic vein in the arm.  This does appear to be patent and of adequate diameter in the upper arm.  Duplex shows occluded fistula.  Her old vein mapping study showed that the left cephalic vein measures between 0.35 and 0.55 cm.  MEDICAL ISSUES: The patient is  scheduled for dialysis catheter tomorrow.  Her left radiocephalic fistula is occluded.  I encouraged her to have a brachiocephalic fistula created tomorrow at the time of her catheter.  I used the sono site to confirm that the cephalic vein was of adequate diameter and patent in the upper arm.  I think she'll be a good candidate for brachiocephalic fistula.  She wishes to proceed with fistula and catheter tomorrow  Annamarie Major, MD [facility] [phone] Pager [phone]

## 2016-03-09 NOTE — OR Nursing (Signed)
Dr. Ola Spurr informed of BP trend, deems  Values close to pre op and pt acceptable for d/c home to take home meds.

## 2016-03-09 NOTE — Op Note (Signed)
OPERATIVE NOTE  PROCEDURE: 1. Right internal jugular vein tunneled dialysis catheter placement 2. Right internal jugular vein cannulation under ultrasound guidance 3. Left brachiocephalic arteriovenous fistula  placement  PRE-OPERATIVE DIAGNOSIS: end-stage renal failure  POST-OPERATIVE DIAGNOSIS: same as above  SURGEON: Adele Barthel, MD  ANESTHESIA: local and MAC  ESTIMATED BLOOD LOSS: 30 cc  FINDING(S): 1.  Tips of the catheter in the right atrium on fluoroscopy 2.  No obvious pneumothorax on fluoroscopy 3.  Small diseased brachial artery (2.5 mm) 4.  Palpable thrill at end of case with dopplerable left radial artery signal  SPECIMEN(S):  none  INDICATIONS:   [name] is a 75 y.o. female who presents with end stage renal disease.  The patient presents for tunneled dialysis catheter placement and left brachiocephalic arteriovenous fistula  placement.    The patient is aware the risks of tunneled dialysis catheter placement include but are not limited to: bleeding, infection, central venous injury, pneumothorax, possible venous stenosis, possible malpositioning in the venous system, and possible infections related to long-term catheter presence.    Risk, benefits, and alternatives to access surgery were discussed.  The patient is aware the risks include but are not limited to: bleeding, infection, steal syndrome, nerve damage, ischemic monomelic neuropathy, failure to mature, need for additional procedures, death and stroke.  The patient was aware of these risks and agreed to proceed.   DESCRIPTION: After written full informed consent was obtained from the patient, the patient was taken back to the operating room.  Prior to induction, the patient was given IV antibiotics.  After obtaining adequate sedation, the patient was prepped and draped in the standard fashion for a chest or neck tunneled dialysis catheter placement.   The cannulation site, the catheter exit site, and  tract for the subcutaneous tunnel were then anesthestized with a total of 30 cc of 1% lidocaine without epinephrine.  Under ultrasound guidance, the right internal jugular vein was cannulated with the 18 gauge needle.  A J-wire was then placed down into the right ventricle under fluoroscopic guidance.  The wire was then secured in place with a clamp to the drapes.  I then made stab incisions at the neck and exit sites.   I dissected from the exit site to the cannulation site with a tunneler.   The subcutaneous tunnel was dilated by passing a plastic dilator over the metal dissector. The wire was then unclamped and I removed the needle.  The skin tract and venotomy was dilated serially with dilators.  Finally, the dilator-sheath was placed under fluoroscopic guidance into the superior vena cava.  The dilator and wire were removed.  A 23 cm Palindrome catheter was placed under fluoroscopic guidance down into the right atrium.  The sheath was broken and peeled away while holding the catheter cuff at the level of the skin.  The back end of this catheter was transected, and docked onto the tunneler.  The distal catheter was delivered through the subcutaneous tunnel.  The catheter was transected a second time, revealing the two lumens of this catheter.  The ports were docked onto these two lumens.  The catheter collar was then snapped into place.  Each port was tested by aspirating and flushing.  No resistance was noted.  Each port was then thoroughly flushed with heparinized saline.  The catheter was secured in placed with two interrupted stitches of 3-0 Nylon tied to the catheter.  The neck incision was closed with a U-stitch of 4-0 Monocryl.  The neck and chest incision were cleaned and sterile bandages applied.  Each port was then loaded with concentrated heparin (1000 Units/mL) at the manufacturer recommended volumes to each port.  Sterile caps were applied to each port.    On completion fluoroscopy, the tips of  the catheter were in the right atrium, and there was no evidence of pneumothorax.  The drapes were taken down and the patient repositioned for a left arm access procedure.  I turned my attention first to identifying the patient's cephalic vein and brachial artery.  Using SonoSite guidance, the location of these vessels were marked out on the skin.     At this point, I injected local anesthetic to obtain a field block of the antecubitum.  In total, I injected about 5 mL of 1% lidocaine without epinephrine.  I made a transverse incision at the level of the antecubitum and dissected through the subcutaneous tissue and fascia to gain exposure of the brachial artery.  This was noted to be 2.5 mm in diameter externally.  This was dissected out proximally and distally and controlled with vessel loops .  I then dissected out the cephalic vein.  This was noted to be 3-3.5 mm in diameter externally.  The distal segment of the vein was ligated with a  2-0 silk, and the vein was transected.  The proximal segment was interrogated with serial dilators.  The vein accepted up to a 3.5 mm dilator without any difficulty.  I then instilled the heparinized saline into the vein and clamped it.  At this point, I reset my exposure of the brachial artery and placed the artery under tension proximally and distally.  I made an arteriotomy with a #11 blade, and then I extended the arteriotomy with a Potts scissor.  Of note this artery already had evidence of plaque in the arterial wall.  I injected heparinized saline proximal and distal to this arteriotomy.  The vein was then sewn to the artery in an end-to-side configuration with a running stitch of 7-0 Prolene, taking care to tack the plaque in the artery.  Prior to completing this anastomosis, I allowed the vein and artery to backbleed.  There was no evidence of clot from any vessels.  I completed the anastomosis in the usual fashion and then released all vessel loops and clamps.     There was a palpable thrill in the venous outflow, and there was a dopplerable radial pulse.  At this point, I irrigated out the surgical wound.  There was no further active bleeding.  The subcutaneous tissue was reapproximated with a running stitch of 3-0 Vicryl.  The skin was then reapproximated with a running subcuticular stitch of 4-0 Vicryl.  The skin was then cleaned, dried, and reinforced with LiquidBand.  The patient tolerated this procedure well.    COMPLICATIONS: none  CONDITION: stable   Adele Barthel, MD [facility] Office: [phone] Pager: [phone]  03/09/2016, 10:07 AM

## 2016-03-10 ENCOUNTER — Encounter (HOSPITAL_COMMUNITY): Payer: Self-pay | Admitting: Vascular Surgery

## 2016-03-12 ENCOUNTER — Inpatient Hospital Stay (HOSPITAL_COMMUNITY): Admission: RE | Admit: 2016-03-12 | Payer: Medicare Other | Source: Ambulatory Visit

## 2016-08-19 ENCOUNTER — Ambulatory Visit (HOSPITAL_COMMUNITY)
Admission: EM | Admit: 2016-08-19 | Discharge: 2016-08-19 | Disposition: A | Discharge status: Home / Self Care | Payer: Medicare Other | Attending: Family Medicine | Admitting: Family Medicine

## 2016-08-19 ENCOUNTER — Encounter (HOSPITAL_COMMUNITY): Payer: Self-pay | Admitting: Family Medicine

## 2016-08-19 DIAGNOSIS — H10021 Other mucopurulent conjunctivitis, right eye: Secondary | ICD-10-CM | POA: Diagnosis not present

## 2016-08-19 MED ORDER — TOBRAMYCIN 0.3 % OP SOLN: 1.0000 [drp] | OPHTHALMIC | 0 refills | Status: DC

## 2016-08-19 NOTE — ED Provider Notes (Signed)
[name]    CSN: [id number] Arrival date & time: 08/19/16  1013     History   Chief Complaint Chief Complaint  Patient presents with  . Eye Problem    HPI [name] is a 76 y.o. female.   76 year old woman presents to the [facility] urgent care center with right eye pain that began this morning. This is associated with redness  This woman is on chronic dialysis Tuesday Thursday and Saturday. She's had some blurriness in her eye but no diplopia and no significant pain      Past Medical History:  Diagnosis Date  . Anemia   . Arthritis   . Cancer ([place])    urterine- surgical removal only tratment  . Chronic kidney disease    end stage 4  . Environmental and seasonal allergies    affects sinuses  . Heart murmur    no one has mentioned it lately- had it as a child  . Hypertension   . Limb cramps    leg, thigh, hands  . Pneumonia    "young"  . Shortness of breath dyspnea    With exerion    Patient Active Problem List   Diagnosis Date Noted  . Anemia of chronic disease 11/27/2015  . AKI (acute kidney injury) ([place]) 09/13/2014  . Nephritic syndrome 09/12/2014    Past Surgical History:  Procedure Laterality Date  . ABDOMINAL HYSTERECTOMY     complete  . AV FISTULA PLACEMENT Left 01/23/2016   Procedure: LEFT RADIOCEPHALIC  ARTERIOVENOUS (AV) FISTULA CREATION;  Surgeon: Serafina Mitchell, MD;  Location: [facility];  Service: Vascular;  Laterality: Left;  . AV FISTULA PLACEMENT Left 03/09/2016   Procedure: ARTERIOVENOUS (AV) FISTULA CREATION-LEFT UPPER ARM;  Surgeon: Conrad [place], MD;  Location: [facility];  Service: Vascular;  Laterality: Left;  . BREAST SURGERY Bilateral    4 surgeries, Cyst in each breast  . COLONOSCOPY W/ POLYPECTOMY    . DILATION AND CURETTAGE OF UTERUS    . INSERTION OF DIALYSIS CATHETER N/A 03/09/2016   Procedure: INSERTION OF DIALYSIS CATHETER;  Surgeon: Conrad [place], MD;  Location: [facility] OR;  Service: Vascular;   Laterality: N/A;    OB History    No data available       Home Medications    Prior to Admission medications   Medication Sig Start Date End Date Taking? Authorizing Provider  calcitRIOL (ROCALTROL) 0.5 MCG capsule Take 1 capsule by mouth 2 (two) times daily. 11/26/15   Historical Provider, MD  calcium acetate (PHOSLO) 667 MG capsule Take 667 mg by mouth 3 (three) times daily with meals.  01/12/16   Historical Provider, MD  furosemide (LASIX) 80 MG tablet Take 160 mg by mouth 2 (two) times daily.    Historical Provider, MD  losartan (COZAAR) 50 MG tablet Take 1 tablet by mouth at bedtime. 12/19/15   Historical Provider, MD  nebivolol (BYSTOLIC) 10 MG tablet Take 10 mg by mouth at bedtime.     Historical Provider, MD  oxyCODONE-acetaminophen (ROXICET) 5-325 MG tablet Take 1 tablet by mouth every 6 (six) hours as needed for moderate pain. 03/09/16   Conrad [place], MD  tobramycin (TOBREX) 0.3 % ophthalmic solution Place 1 drop into the right eye every 4 (four) hours. 08/19/16   Robyn Haber, MD    Family History Family History  Problem Relation Age of Onset  . Cancer - Lung Mother   . Renal Disease Father   .  Heart attack Father     Social History Social History  Substance Use Topics  . Smoking status: Never Smoker  . Smokeless tobacco: Never Used  . Alcohol use No     Allergies   No known allergies   Review of Systems Review of Systems  HENT: Positive for congestion. Negative for dental problem.   Eyes: Positive for discharge, redness and itching.  All other systems reviewed and are negative.    Physical Exam Triage Vital Signs ED Triage Vitals [08/19/16 1057]  Enc Vitals Group     BP (!) 157/88     Pulse Rate 76     Resp 18     Temp 98.5 F (36.9 C)     Temp src      SpO2 96 %     Weight      Height      Head Circumference      Peak Flow      Pain Score      Pain Loc      Pain Edu?      Excl. in [place]?    No data found.   Updated Vital Signs BP (!)  157/88   Pulse 76   Temp 98.5 F (36.9 C)   Resp 18   LMP  (LMP Unknown)   SpO2 96%    Physical Exam  Constitutional: She is oriented to person, place, and time. She appears well-developed and well-nourished.  HENT:  Right Ear: External ear normal.  Left Ear: External ear normal.  Mouth/Throat: Oropharynx is clear and moist.  Eyes: EOM are normal. Pupils are equal, round, and reactive to light. Right eye exhibits discharge. Left eye exhibits no discharge. No scleral icterus.  Lipids are nontender although the right upper and lower lid are slightly swollen  Neck: Normal range of motion. Neck supple.  Pulmonary/Chest: Effort normal.  Musculoskeletal: Normal range of motion.  Neurological: She is alert and oriented to person, place, and time.  Skin: Skin is warm and dry.  Nursing note and vitals reviewed.    UC Treatments / Results  Labs (all labs ordered are listed, but only abnormal results are displayed) Labs Reviewed - No data to display  EKG  EKG Interpretation None       Radiology No results found.  Procedures Procedures (including critical care time)  Medications Ordered in UC Medications - No data to display   Initial Impression / Assessment and Plan / UC Course  I have reviewed the triage vital signs and the nursing notes.  Pertinent labs & imaging results that were available during my care of the patient were reviewed by me and considered in my medical decision making (see chart for details).      Final Clinical Impressions(s) / UC Diagnoses   Final diagnoses:  Other mucopurulent conjunctivitis of right eye    New Prescriptions New Prescriptions   TOBRAMYCIN (TOBREX) 0.3 % OPHTHALMIC SOLUTION    Place 1 drop into the right eye every 4 (four) hours.     Robyn Haber, MD 08/19/16 1120

## 2016-08-19 NOTE — Discharge Instructions (Signed)
Remember to wash her pillowcase so that you don't we contaminate your eye. Start the drops, you will not be contagious.

## 2016-08-19 NOTE — ED Triage Notes (Signed)
Pt here for redness to right eye that she woke up with,

## 2017-01-24 IMAGING — DX DG CHEST 1V PORT
1 series · 1 of 1 positions shown · non-contrast
Comparison: 11/13/2009.

CLINICAL DATA: Line placement.

EXAM:
PORTABLE CHEST 1 VIEW

[chest ap]
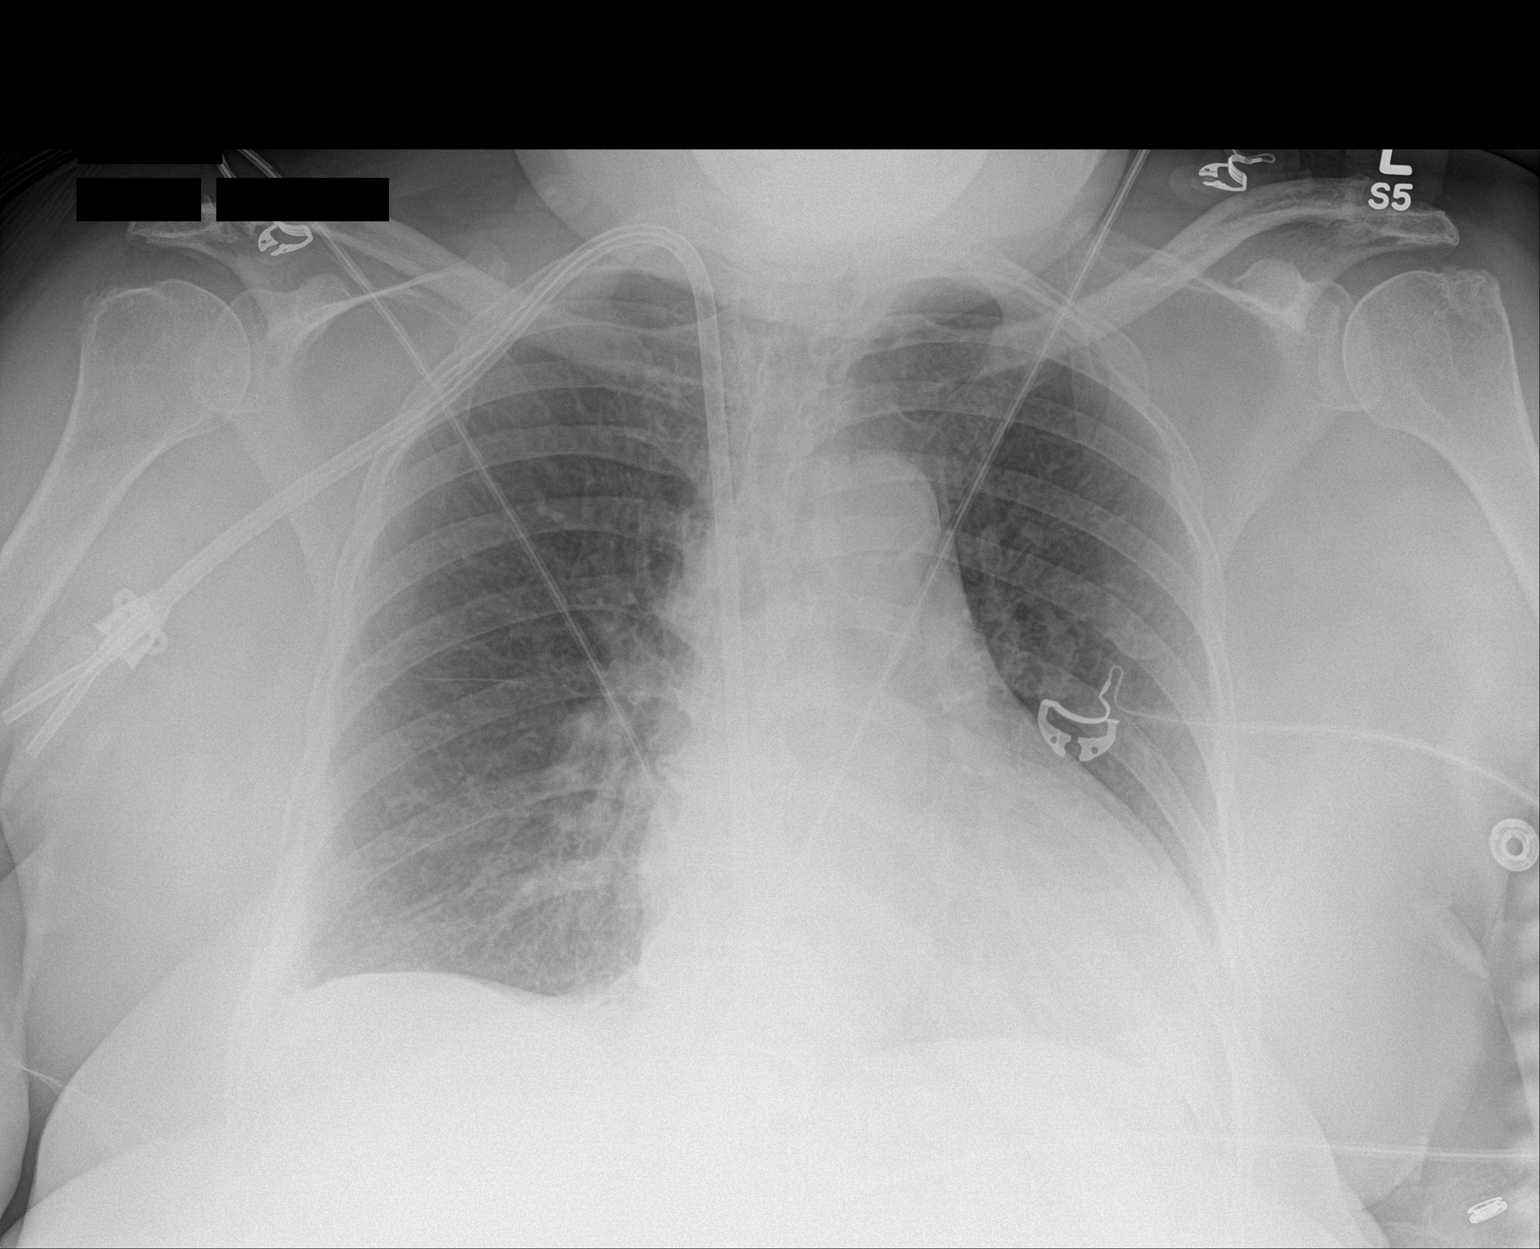

[1 of 1 positions shown; findings below may reference images not displayed]

FINDINGS: Dual-lumen catheter tip noted projected over the right atrium. No
complicating features. No pneumothorax. Cardiomegaly with mild
pulmonary venous congestion. No focal infiltrate. Small right
pleural effusion cannot be excluded.
IMPRESSION: 1. Right IJ dual-lumen catheter with tip projected over right
atrium. No pneumothorax.

2. Cardiomegaly with mild pulmonary venous congestion and small
right pleural effusion.

## 2018-06-20 ENCOUNTER — Encounter (HOSPITAL_COMMUNITY): Payer: Self-pay

## 2018-06-20 ENCOUNTER — Other Ambulatory Visit: Payer: Self-pay

## 2018-06-20 ENCOUNTER — Inpatient Hospital Stay (HOSPITAL_COMMUNITY)
Admission: EM | Admit: 2018-06-20 | Discharge: 2018-06-22 | DRG: 640 | Disposition: A | Discharge status: Home / Self Care | Payer: Medicare Other | Attending: Internal Medicine | Admitting: Internal Medicine

## 2018-06-20 ENCOUNTER — Emergency Department (HOSPITAL_COMMUNITY): Payer: Medicare Other

## 2018-06-20 ENCOUNTER — Ambulatory Visit (HOSPITAL_COMMUNITY)
Admission: EM | Admit: 2018-06-20 | Discharge: 2018-06-20 | Disposition: A | Discharge status: Home / Self Care | Payer: Medicare Other | Source: Home / Self Care

## 2018-06-20 DIAGNOSIS — D631 Anemia in chronic kidney disease: Secondary | ICD-10-CM | POA: Diagnosis present

## 2018-06-20 DIAGNOSIS — Z6837 Body mass index (BMI) 37.0-37.9, adult: Secondary | ICD-10-CM

## 2018-06-20 DIAGNOSIS — E8770 Fluid overload, unspecified: Secondary | ICD-10-CM | POA: Diagnosis present

## 2018-06-20 DIAGNOSIS — I5031 Acute diastolic (congestive) heart failure: Secondary | ICD-10-CM | POA: Diagnosis present

## 2018-06-20 DIAGNOSIS — Z8542 Personal history of malignant neoplasm of other parts of uterus: Secondary | ICD-10-CM

## 2018-06-20 DIAGNOSIS — Z8249 Family history of ischemic heart disease and other diseases of the circulatory system: Secondary | ICD-10-CM

## 2018-06-20 DIAGNOSIS — R0600 Dyspnea, unspecified: Secondary | ICD-10-CM

## 2018-06-20 DIAGNOSIS — D696 Thrombocytopenia, unspecified: Secondary | ICD-10-CM | POA: Diagnosis present

## 2018-06-20 DIAGNOSIS — N2581 Secondary hyperparathyroidism of renal origin: Secondary | ICD-10-CM | POA: Diagnosis present

## 2018-06-20 DIAGNOSIS — E875 Hyperkalemia: Principal | ICD-10-CM | POA: Diagnosis present

## 2018-06-20 DIAGNOSIS — Z79891 Long term (current) use of opiate analgesic: Secondary | ICD-10-CM

## 2018-06-20 DIAGNOSIS — I132 Hypertensive heart and chronic kidney disease with heart failure and with stage 5 chronic kidney disease, or end stage renal disease: Secondary | ICD-10-CM | POA: Diagnosis present

## 2018-06-20 DIAGNOSIS — E872 Acidosis: Secondary | ICD-10-CM | POA: Diagnosis not present

## 2018-06-20 DIAGNOSIS — Z992 Dependence on renal dialysis: Secondary | ICD-10-CM

## 2018-06-20 DIAGNOSIS — D539 Nutritional anemia, unspecified: Secondary | ICD-10-CM | POA: Diagnosis present

## 2018-06-20 DIAGNOSIS — Z841 Family history of disorders of kidney and ureter: Secondary | ICD-10-CM

## 2018-06-20 DIAGNOSIS — N186 End stage renal disease: Secondary | ICD-10-CM | POA: Diagnosis present

## 2018-06-20 DIAGNOSIS — Z794 Long term (current) use of insulin: Secondary | ICD-10-CM

## 2018-06-20 DIAGNOSIS — Z6841 Body Mass Index (BMI) 40.0 and over, adult: Secondary | ICD-10-CM

## 2018-06-20 DIAGNOSIS — I10 Essential (primary) hypertension: Secondary | ICD-10-CM | POA: Diagnosis present

## 2018-06-20 DIAGNOSIS — Z79899 Other long term (current) drug therapy: Secondary | ICD-10-CM

## 2018-06-20 DIAGNOSIS — E8729 Other acidosis: Secondary | ICD-10-CM | POA: Diagnosis present

## 2018-06-20 DIAGNOSIS — Z90710 Acquired absence of both cervix and uterus: Secondary | ICD-10-CM

## 2018-06-20 LAB — BASIC METABOLIC PANEL
Anion gap: 18 — ABNORMAL HIGH (ref 5–15)
BUN: 97 mg/dL — ABNORMAL HIGH (ref 8–23)
CO2: 15 mmol/L — ABNORMAL LOW (ref 22–32)
Calcium: 8.4 mg/dL — ABNORMAL LOW (ref 8.9–10.3)
Chloride: 109 mmol/L (ref 98–111)
Creatinine, Ser: 13.15 mg/dL — ABNORMAL HIGH (ref 0.44–1.00)
GFR calc Af Amer: 3 mL/min — ABNORMAL LOW (ref >60)
GFR calc non Af Amer: 2 mL/min — ABNORMAL LOW (ref >60)
Glucose, Bld: 88 mg/dL (ref 70–99)
Potassium: 6.7 mmol/L (ref 3.5–5.1)
Sodium: 142 mmol/L (ref 135–145)

## 2018-06-20 LAB — CBC
HCT: 34 % — ABNORMAL LOW (ref 36.0–46.0)
Hemoglobin: 10.3 g/dL — ABNORMAL LOW (ref 12.0–15.0)
MCH: 33.3 pg (ref 26.0–34.0)
MCHC: 30.3 g/dL (ref 30.0–36.0)
MCV: 110 fL — ABNORMAL HIGH (ref 80.0–100.0)
Platelets: 153 10*3/uL (ref 150–400)
RBC: 3.09 MIL/uL — ABNORMAL LOW (ref 3.87–5.11)
RDW: 14 % (ref 11.5–15.5)
WBC: 6.1 10*3/uL (ref 4.0–10.5)
nRBC: 0 % (ref 0.0–0.2)

## 2018-06-20 LAB — CBG MONITORING, ED: GLUCOSE-CAPILLARY: 166 mg/dL — AB (ref 70–99)

## 2018-06-20 MED ORDER — CHLORHEXIDINE GLUCONATE CLOTH 2 % EX PADS: 6.0000 | MEDICATED_PAD | Freq: Every day | CUTANEOUS | Status: DC

## 2018-06-20 MED ORDER — SODIUM CHLORIDE 0.9 % IV SOLN: 100.0000 mL | INTRAVENOUS | Status: DC | PRN

## 2018-06-20 MED ORDER — ONDANSETRON HCL 4 MG/2ML IJ SOLN: 4.0000 mg | Freq: Four times a day (QID) | INTRAMUSCULAR | Status: DC | PRN

## 2018-06-20 MED ORDER — INSULIN ASPART 100 UNIT/ML IV SOLN
5.0000 [IU] | Freq: Once | INTRAVENOUS | Status: AC
  Administered 2018-06-20: 5 [IU] via INTRAVENOUS

## 2018-06-20 MED ORDER — ONDANSETRON HCL 4 MG PO TABS: 4.0000 mg | ORAL_TABLET | Freq: Four times a day (QID) | ORAL | Status: DC | PRN

## 2018-06-20 MED ORDER — LIDOCAINE HCL (PF) 1 % IJ SOLN: 5.0000 mL | INTRAMUSCULAR | Status: DC | PRN

## 2018-06-20 MED ORDER — TOBRAMYCIN 0.3 % OP SOLN
1.0000 [drp] | OPHTHALMIC | Status: DC
  Administered 2018-06-21: 1 [drp] via OPHTHALMIC
  Filled 2018-06-20: qty 5

## 2018-06-20 MED ORDER — SODIUM BICARBONATE 8.4 % IV SOLN
50.0000 meq | Freq: Once | INTRAVENOUS | Status: AC
  Administered 2018-06-20: 50 meq via INTRAVENOUS
  Filled 2018-06-20: qty 50

## 2018-06-20 MED ORDER — ALBUTEROL (5 MG/ML) CONTINUOUS INHALATION SOLN
5.0000 mg/h | INHALATION_SOLUTION | Freq: Once | RESPIRATORY_TRACT | Status: AC
  Administered 2018-06-20: 5 mg/h via RESPIRATORY_TRACT
  Filled 2018-06-20: qty 20

## 2018-06-20 MED ORDER — DEXTROSE 50 % IV SOLN
1.0000 | Freq: Once | INTRAVENOUS | Status: AC
  Administered 2018-06-20: 50 mL via INTRAVENOUS
  Filled 2018-06-20: qty 50

## 2018-06-20 MED ORDER — CALCITRIOL 0.5 MCG PO CAPS
0.5000 ug | ORAL_CAPSULE | Freq: Two times a day (BID) | ORAL | Status: DC
  Administered 2018-06-21 – 2018-06-22 (×3): 0.5 ug via ORAL
  Filled 2018-06-20 (×2): qty 1

## 2018-06-20 MED ORDER — ALTEPLASE 2 MG IJ SOLR
2.0000 mg | Freq: Once | INTRAMUSCULAR | Status: DC | PRN
  Filled 2018-06-20: qty 2

## 2018-06-20 MED ORDER — HEPARIN SODIUM (PORCINE) 5000 UNIT/ML IJ SOLN
5000.0000 [IU] | Freq: Three times a day (TID) | INTRAMUSCULAR | Status: DC
  Administered 2018-06-21 (×3): 5000 [IU] via SUBCUTANEOUS
  Filled 2018-06-20 (×3): qty 1

## 2018-06-20 MED ORDER — CALCIUM ACETATE (PHOS BINDER) 667 MG PO CAPS
667.0000 mg | ORAL_CAPSULE | Freq: Three times a day (TID) | ORAL | Status: DC
  Administered 2018-06-21 (×3): 667 mg via ORAL
  Filled 2018-06-20 (×3): qty 1

## 2018-06-20 MED ORDER — NEBIVOLOL HCL 10 MG PO TABS
10.0000 mg | ORAL_TABLET | Freq: Every day | ORAL | Status: DC
  Administered 2018-06-21: 10 mg via ORAL
  Filled 2018-06-20: qty 1

## 2018-06-20 MED ORDER — PENTAFLUOROPROP-TETRAFLUOROETH EX AERO: 1.0000 "application " | INHALATION_SPRAY | CUTANEOUS | Status: DC | PRN

## 2018-06-20 MED ORDER — FUROSEMIDE 80 MG PO TABS
160.0000 mg | ORAL_TABLET | Freq: Two times a day (BID) | ORAL | Status: DC
  Administered 2018-06-21 (×2): 160 mg via ORAL
  Filled 2018-06-20 (×2): qty 2

## 2018-06-20 MED ORDER — OXYCODONE-ACETAMINOPHEN 5-325 MG PO TABS: 1.0000 | ORAL_TABLET | Freq: Four times a day (QID) | ORAL | Status: DC | PRN

## 2018-06-20 MED ORDER — LOSARTAN POTASSIUM 50 MG PO TABS
50.0000 mg | ORAL_TABLET | Freq: Every day | ORAL | Status: DC
  Administered 2018-06-21: 50 mg via ORAL
  Filled 2018-06-20: qty 1

## 2018-06-20 MED ORDER — CALCIUM GLUCONATE 10 % IV SOLN
1.0000 g | Freq: Once | INTRAVENOUS | Status: AC
  Administered 2018-06-20: 1 g via INTRAVENOUS
  Filled 2018-06-20: qty 10

## 2018-06-20 MED ORDER — LIDOCAINE-PRILOCAINE 2.5-2.5 % EX CREA: 1.0000 "application " | TOPICAL_CREAM | CUTANEOUS | Status: DC | PRN

## 2018-06-20 MED ORDER — HEPARIN SODIUM (PORCINE) 1000 UNIT/ML DIALYSIS: 1000.0000 [IU] | INTRAMUSCULAR | Status: DC | PRN

## 2018-06-20 NOTE — ED Provider Notes (Signed)
[facility] EMERGENCY DEPARTMENT Provider Note   CSN: [id number] Arrival date & time: 06/20/18  1505    History   Chief Complaint Chief Complaint  Patient presents with  . Shortness of Breath    HPI [name] is a 78 y.o. female.     HPI   78 year old female with dyspnea.  End-stage renal disease.  Monday Wednesday Friday.  Last dialysis was on Friday.  She missed yesterday she says she was not feeling well.  She says she was congested and "my allergies are acting up."  She began feeling progressively short of breath since yesterday.  Worse with ambulation.  Denies any acute pain.  No fevers or chills.  Past Medical History:  Diagnosis Date  . Anemia   . Arthritis   . Cancer ([place])    urterine- surgical removal only tratment  . Chronic kidney disease    end stage 4  . Environmental and seasonal allergies    affects sinuses  . Heart murmur    no one has mentioned it lately- had it as a child  . Hypertension   . Limb cramps    leg, thigh, hands  . Pneumonia    "young"  . Shortness of breath dyspnea    With exerion    Patient Active Problem List   Diagnosis Date Noted  . Anemia of chronic disease 11/27/2015  . AKI (acute kidney injury) ([place]) 09/13/2014  . Nephritic syndrome 09/12/2014    Past Surgical History:  Procedure Laterality Date  . ABDOMINAL HYSTERECTOMY     complete  . AV FISTULA PLACEMENT Left 01/23/2016   Procedure: LEFT RADIOCEPHALIC  ARTERIOVENOUS (AV) FISTULA CREATION;  Surgeon: Serafina Mitchell, MD;  Location: [facility];  Service: Vascular;  Laterality: Left;  . AV FISTULA PLACEMENT Left 03/09/2016   Procedure: ARTERIOVENOUS (AV) FISTULA CREATION-LEFT UPPER ARM;  Surgeon: Conrad [place], MD;  Location: [facility];  Service: Vascular;  Laterality: Left;  . BREAST SURGERY Bilateral    4 surgeries, Cyst in each breast  . COLONOSCOPY W/ POLYPECTOMY    . DILATION AND CURETTAGE OF UTERUS    . INSERTION OF DIALYSIS CATHETER N/A 03/09/2016     Procedure: INSERTION OF DIALYSIS CATHETER;  Surgeon: Conrad [place], MD;  Location: [facility];  Service: Vascular;  Laterality: N/A;     OB History   No obstetric history on file.      Home Medications    Prior to Admission medications   Medication Sig Start Date End Date Taking? Authorizing Provider  calcitRIOL (ROCALTROL) 0.5 MCG capsule Take 1 capsule by mouth 2 (two) times daily. 11/26/15   [provider]  calcium acetate (PHOSLO) 667 MG capsule Take 667 mg by mouth 3 (three) times daily with meals.  01/12/16   [provider]  furosemide (LASIX) 80 MG tablet Take 160 mg by mouth 2 (two) times daily.    [provider]  losartan (COZAAR) 50 MG tablet Take 1 tablet by mouth at bedtime. 12/19/15   [provider]  nebivolol (BYSTOLIC) 10 MG tablet Take 10 mg by mouth at bedtime.     [provider]  oxyCODONE-acetaminophen (ROXICET) 5-325 MG tablet Take 1 tablet by mouth every 6 (six) hours as needed for moderate pain. 03/09/16   Conrad [place], MD  tobramycin (TOBREX) 0.3 % ophthalmic solution Place 1 drop into the right eye every 4 (four) hours. 08/19/16   Robyn Haber, MD    Family History Family  History  Problem Relation Age of Onset  . Cancer - Lung Mother   . Renal Disease Father   . Heart attack Father     Social History Social History   Tobacco Use  . Smoking status: Never Smoker  . Smokeless tobacco: Never Used  Substance Use Topics  . Alcohol use: No  . Drug use: No     Allergies   No known allergies   Review of Systems Review of Systems  All systems reviewed and negative, other than as noted in HPI.  Physical Exam Updated Vital Signs BP (!) 155/66 (BP Location: Right Arm)   Pulse 67   Temp 97.8 F (36.6 C) (Oral)   Resp 19   Ht 5\' 1"  (1.549 m)   LMP  (LMP Unknown)   SpO2 94%   BMI 41.19 kg/m   Physical Exam Vitals signs and nursing note reviewed.  Constitutional:      General: She is not in acute  distress.    Appearance: She is well-developed.  HENT:     Head: Normocephalic and atraumatic.  Eyes:     General:        Right eye: No discharge.        Left eye: No discharge.     Conjunctiva/sclera: Conjunctivae normal.  Neck:     Musculoskeletal: Neck supple.  Cardiovascular:     Rate and Rhythm: Normal rate and regular rhythm.     Heart sounds: Normal heart sounds. No murmur. No friction rub. No gallop.      Comments: DVT fistula left upper extremity with palpable thrill. Pulmonary:     Effort: Pulmonary effort is normal.     Comments: Mild tachypnea.  Crackles bilaterally. Abdominal:     General: There is no distension.     Palpations: Abdomen is soft.     Tenderness: There is no abdominal tenderness.  Musculoskeletal:        General: No tenderness.  Skin:    General: Skin is warm and dry.  Neurological:     Mental Status: She is alert.  Psychiatric:        Behavior: Behavior normal.        Thought Content: Thought content normal.      ED Treatments / Results  Labs (all labs ordered are listed, but only abnormal results are displayed) Labs Reviewed  CBC - Abnormal; Notable for the following components:      Result Value   RBC 3.09 (*)    Hemoglobin 10.3 (*)    HCT 34.0 (*)    MCV 110.0 (*)    All other components within normal limits  BASIC METABOLIC PANEL - Abnormal; Notable for the following components:   Potassium 6.7 (*)    CO2 15 (*)    BUN 97 (*)    Creatinine, Ser 13.15 (*)    Calcium 8.4 (*)    GFR calc non Af Amer 2 (*)    GFR calc Af Amer 3 (*)    Anion gap 18 (*)    All other components within normal limits  GLUCOSE, RANDOM    EKG EKG Interpretation  Date/Time:  Tuesday June 20 2018 15:13:15 EST Ventricular Rate:  68 PR Interval:  128 QRS Duration: 74 QT Interval:  416 QTC Calculation: 442 R Axis:   28 Text Interpretation:  Normal sinus rhythm Low voltage QRS Cannot rule out Anterior infarct , age undetermined Abnormal ECG  Confirmed by Virgel Manifold [id number]) on 06/20/2018 5:03:48 PM  Radiology Dg Chest 2 View  Result Date: 06/20/2018 CLINICAL DATA:  Shortness of breath EXAM: CHEST - 2 VIEW COMPARISON:  03/19/2016 FINDINGS: Small bilateral pleural effusion. Bilateral lower lung zone airspace disease. Cardiomegaly with vascular congestion. Aortic atherosclerosis. No pneumothorax. IMPRESSION: 1. Cardiomegaly with vascular congestion and mild interstitial edema. There are small pleural effusions. 2. Airspace disease at the bilateral lung bases right greater than left, suspect for superimposed pneumonia Electronically Signed   By: Donavan Foil M.D.   On: 06/20/2018 16:41    Procedures Procedures (including critical care time)  CRITICAL CARE Performed by: Virgel Manifold Total critical care time: 35 minutes Critical care time was exclusive of separately billable procedures and treating other patients. Critical care was necessary to treat or prevent imminent or life-threatening deterioration. Critical care was time spent personally by me on the following activities: development of treatment plan with patient and/or surrogate as well as nursing, discussions with consultants, evaluation of patient's response to treatment, examination of patient, obtaining history from patient or surrogate, ordering and performing treatments and interventions, ordering and review of laboratory studies, ordering and review of radiographic studies, pulse oximetry and re-evaluation of patient's condition.   Medications Ordered in ED Medications  insulin aspart (novoLOG) injection 5 Units (has no administration in time range)    And  dextrose 50 % solution 50 mL (has no administration in time range)  sodium bicarbonate injection 50 mEq (has no administration in time range)  calcium gluconate inj 10% (1 g) URGENT USE ONLY! (has no administration in time range)  albuterol (PROVENTIL,VENTOLIN) solution continuous neb (5 mg/hr Nebulization  Given 06/20/18 1806)     Initial Impression / Assessment and Plan / ED Course  I have reviewed the triage vital signs and the nursing notes.  Pertinent labs & imaging results that were available during my care of the patient were reviewed by me and considered in my medical decision making (see chart for details).  78 year old female with dyspnea.  Volume overloaded.  Missed HD recently.  She is hyperkalemic without overt EKG changes.  She desats with ambulation but is not hypoxic at rest.  Chest x-ray noted.  I think that this is fluid overload.  I do not think that this is an infectious process.  She is afebrile.  She denies subjective fevers.  She has no leukocytosis.  Discussed case with Dr. Justin Mend, nephrology.  Patient is to go for HD.  Will start treating hyper kalemia medically.    Final Clinical Impressions(s) / ED Diagnoses   Final diagnoses:  Dyspnea, unspecified type  ESRD (end stage renal disease) ([place])  Hyperkalemia    ED Discharge Orders    None       Virgel Manifold, MD 06/20/18 1820

## 2018-06-20 NOTE — Plan of Care (Signed)
  Problem: Education: Goal: Knowledge of General Education information will improve Description Including pain rating scale, medication(s)/side effects and non-pharmacologic comfort measures Outcome: Progressing   

## 2018-06-20 NOTE — ED Triage Notes (Signed)
Pt here for shortness of breath for the last 2 days.  Hx of CKD.  No chest pain only shortness of breath when taking deep breaths.

## 2018-06-20 NOTE — H&P (Signed)
History and Physical    PLEASE NOTE THAT DRAGON DICTATION SOFTWARE WAS USED IN THE CONSTRUCTION OF THIS NOTE.   [name] ZGY:[id number] DOB: [birth year] DOA: 78/18/2020  PCP: Levin Erp, MD Patient coming from: home  I have personally briefly reviewed patient's old medical records in [place]  Chief Complaint: shortness of breath  HPI: [name] is a 78 y.o. female with medical history significant for end-stage renal disease on hemodialysis in the setting of nephritic syndrome, hypertension, anemia of chronic kidney disease, who was admitted to [facility] on 06/20/2018 with need for expedited hemodialysis in the setting of hyperkalemia, volume overload, and metabolic acidosis after presenting from home to [facility] emergency department complaining of shortness of breath after missing yesterday's hemodialysis session.  Patient has a history of end-stage renal disease for which she is on hemodialysis on a Monday, Wednesday, Friday schedule. In the setting of feeling fatigue, the patient acknowledges that she elected to miss her scheduled hemodialysis yesterday, 06/19/18.  Currently, today she reports feeling slightly short of breath with a nonproductive cough.  Denies any associated chest pain, palpitations, nausea/vomiting, or diaphoresis.  Denies any associated subjective fever, chills, rigors, or generalized myalgias.   ED Course: Vital signs in the emergency department were notable for the following: Temperature 97.8; heart rate 77-1 07; blood pressure 155/66; respiratory rate 18-19, and oxygen saturation 94 to 100% on room air.  Labs performed in the ED were notable for the following: BMP notable for sodium 142, potassium 6.7, bicarbonate 15, anion gap 18.  CBC notable for white blood cell count of 6000 with hemoglobin 10.3.  2 view chest x-ray, per final radiology report showed cardiomegaly with pulmonary vascular congestion, interstitial edema, and small  bilateral pleural effusions in the absence of any overt infiltrate.  In the setting of hyperkalemia, suspected volume overload, and anion gap metabolic acidosis, the emergency department physician discussed the patient's case with the on-call nephrologist, Dr. Zeeva Courser Mend, who recommended that the patient be admitted to the hospitalist service with plan for hemodialysis to occur this evening.  In terms of interval medical management of hyperkalemia, while in the emergency department, the patient received calcium gluconate 1 g IV x1, NovoLog 5 units IV x1, 1 amp of D50, sodium bicarbonate 50 mEq IV x1, and albuterol nebulizer x1.  Subsequently, the patient was admitted to the med telemetry floor for hemodialysis.     Review of Systems: As per HPI otherwise 10 point review of systems negative.   Past Medical History:  Diagnosis Date  . Anemia   . Arthritis   . Cancer ([place])    urterine- surgical removal only tratment  . Chronic kidney disease    end stage 4  . Environmental and seasonal allergies    affects sinuses  . Heart murmur    no one has mentioned it lately- had it as a child  . Hypertension   . Limb cramps    leg, thigh, hands  . Pneumonia    "young"  . Shortness of breath dyspnea    With exerion    Past Surgical History:  Procedure Laterality Date  . ABDOMINAL HYSTERECTOMY     complete  . AV FISTULA PLACEMENT Left 01/23/2016   Procedure: LEFT RADIOCEPHALIC  ARTERIOVENOUS (AV) FISTULA CREATION;  Surgeon: Serafina Mitchell, MD;  Location: [facility];  Service: Vascular;  Laterality: Left;  . AV FISTULA PLACEMENT Left 03/09/2016   Procedure: ARTERIOVENOUS (AV) FISTULA CREATION-LEFT UPPER ARM;  Surgeon: Jannette Fogo  Bridgett Larsson, MD;  Location: [facility];  Service: Vascular;  Laterality: Left;  . BREAST SURGERY Bilateral    4 surgeries, Cyst in each breast  . COLONOSCOPY W/ POLYPECTOMY    . DILATION AND CURETTAGE OF UTERUS    . INSERTION OF DIALYSIS CATHETER N/A 03/09/2016   Procedure: INSERTION OF  DIALYSIS CATHETER;  Surgeon: Conrad Bath, MD;  Location: [facility];  Service: Vascular;  Laterality: N/A;    Social History:  reports that she has never smoked. She has never used smokeless tobacco. She reports that she does not drink alcohol or use drugs.   Allergies  Allergen Reactions  . No Known Allergies     Family History  Problem Relation Age of Onset  . Cancer - Lung Mother   . Renal Disease Father   . Heart attack Father      Prior to Admission medications   Medication Sig Start Date End Date Taking? Authorizing Provider  calcitRIOL (ROCALTROL) 0.5 MCG capsule Take 1 capsule by mouth 2 (two) times daily. 11/26/15   [provider]  calcium acetate (PHOSLO) 667 MG capsule Take 667 mg by mouth 3 (three) times daily with meals.  01/12/16   [provider]  furosemide (LASIX) 80 MG tablet Take 160 mg by mouth 2 (two) times daily.    [provider]  losartan (COZAAR) 50 MG tablet Take 1 tablet by mouth at bedtime. 12/19/15   [provider]  nebivolol (BYSTOLIC) 10 MG tablet Take 10 mg by mouth at bedtime.     [provider]  oxyCODONE-acetaminophen (ROXICET) 5-325 MG tablet Take 1 tablet by mouth every 6 (six) hours as needed for moderate pain. 03/09/16   Conrad [place], MD  tobramycin (TOBREX) 0.3 % ophthalmic solution Place 1 drop into the right eye every 4 (four) hours. 08/19/16   Robyn Haber, MD      Objective     Physical Exam: Vitals:   06/20/18 1745 06/20/18 1811 06/20/18 1930 06/20/18 1945  BP: (!) 179/56  (!) 201/57 (!) 184/96  Pulse: 77  (!) 107 (!) 106  Resp: 18  (!) 34 18  Temp:      TempSrc:      SpO2: 98% 94% 97% 97%  Height:        General: appears to be stated age; alert, oriented Skin: warm, dry, no rash Head:  AT/[place] Mouth:  Oral mucosa membranes appear moist, normal dentition Neck: supple; trachea midline Heart:  RRR; did not appreciate any M/R/G Lungs: bibasilar crackles noted, no rhonchi.    Abdomen: + BS; soft, ND, NT Extremities: LUE AV fistula noted; trace edema in b/l LE's.    Labs on Admission: I have personally reviewed following labs and imaging studies  CBC: Recent Labs  Lab 06/20/18 1600  WBC 6.1  HGB 10.3*  HCT 34.0*  MCV 110.0*  PLT 488   Basic Metabolic Panel: Recent Labs  Lab 06/20/18 1600  NA 142  K 6.7*  CL 109  CO2 15*  GLUCOSE 88  BUN 97*  CREATININE 13.15*  CALCIUM 8.4*   GFR: CrCl cannot be calculated (Unknown ideal weight.). Liver Function Tests: No results for input(s): AST, ALT, ALKPHOS, BILITOT, PROT, ALBUMIN in the last 168 hours. No results for input(s): LIPASE, AMYLASE in the last 168 hours. No results for input(s): AMMONIA in the last 168 hours. Coagulation Profile: No results for input(s): INR, PROTIME in the last 168 hours. Cardiac Enzymes: No results for input(s): CKTOTAL, CKMB,  CKMBINDEX, TROPONINI in the last 168 hours. BNP (last 3 results) No results for input(s): PROBNP in the last 8760 hours. HbA1C: No results for input(s): HGBA1C in the last 72 hours. CBG: Recent Labs  Lab 06/20/18 1954  GLUCAP 166*   Lipid Profile: No results for input(s): CHOL, HDL, LDLCALC, TRIG, CHOLHDL, LDLDIRECT in the last 72 hours. Thyroid Function Tests: No results for input(s): TSH, T4TOTAL, FREET4, T3FREE, THYROIDAB in the last 72 hours. Anemia Panel: No results for input(s): VITAMINB12, FOLATE, FERRITIN, TIBC, IRON, RETICCTPCT in the last 72 hours. Urine analysis: No results found for: COLORURINE, APPEARANCEUR, LABSPEC, PHURINE, GLUCOSEU, HGBUR, BILIRUBINUR, KETONESUR, PROTEINUR, UROBILINOGEN, NITRITE, LEUKOCYTESUR  Radiological Exams on Admission: Dg Chest 2 View  Result Date: 06/20/2018 CLINICAL DATA:  Shortness of breath EXAM: CHEST - 2 VIEW COMPARISON:  03/19/2016 FINDINGS: Small bilateral pleural effusion. Bilateral lower lung zone airspace disease. Cardiomegaly with vascular congestion. Aortic atherosclerosis. No  pneumothorax. IMPRESSION: 1. Cardiomegaly with vascular congestion and mild interstitial edema. There are small pleural effusions. 2. Airspace disease at the bilateral lung bases right greater than left, suspect for superimposed pneumonia Electronically Signed   By: Donavan Foil M.D.   On: 06/20/2018 16:41     Assessment/Plan   [name] is a 78 y.o. 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   Principal Problem:   Hyperkalemia Active Problems:   ESRD (end stage renal disease) (HCC)   Volume overload   High anion gap metabolic acidosis   Essential hypertension    #) Hyperkalemia: Presenting labs reflect serum potassium of 6.7 without corresponding EKG changes.  In tandem with presenting evidence of volume overload as well as anion gap metabolic acidosis, this appears to be on the basis of missing yesterday scheduled hemodialysis session in the context of end-stage renal disease.  Case was discussed with the on-call nephrologist, Dr. Caeleb Batalla Mend, who recommended admission to the hospital service with plan for HD overnight.  In the meantime, medical management for hyperkalemia has been initiated, the patient has received calcium gluconate, IV insulin, and sodium bicarbonate.  Plan: Nephrology consulted, with ensuing plan for hemodialysis tonight.  Monitor on telemetry.  Repeat BMP in the morning.     #) End-stage renal disease: In the setting of a history of nephritic syndrome, the patient is on scheduled Monday, Wednesday, Friday hemodialysis, although she reports  her scheduled hemodialysis on 06/19/2018 in the setting of fatigue, which appears to be the driving force behind this evening's presentation involving evidence of volume overload, hyperkalemia, and metabolic acidosis warranting hemodialysis tonight, per nephrology, is further described above.   Plan: HD tonight, as above.  Repeat BMP, magnesium, and phosphorus in the morning.  Emphasized to patient importance of outstanding compliance in attending scheduled hemodialysis sessions.  Will resume home calcitriol and PhosLo.  Monitor strict I's and O's.    #) Hypertension: Outpatient antihypertensive regimen consists of losartan as well as nebivolol.  Presenting blood pressure noted to be 155/66.  Plan: will plan to resume home antihypertensives following hemodialysis this evening.    DVT prophylaxis: scd's Code Status: scd's Family Communication: none Disposition Plan: none Per Rounding Team Consults called: Dr. Drishti Pepperman Mend (  on-call nephrology).  Admission status: Observation, med telemetry.    PLEASE NOTE THAT DRAGON DICTATION SOFTWARE WAS USED IN THE CONSTRUCTION OF THIS NOTE.   [place] [facility] Pager [phone] From [place].   Otherwise, please contact night-coverage  [email or web address] Password [facility]  06/20/2018, 8:05 PM

## 2018-06-20 NOTE — Progress Notes (Signed)
2300 Report received from off-going RN. Transport here picking up patient for HD. Greeted patient in hallway, will assess when she returns from HD.

## 2018-06-20 NOTE — ED Notes (Signed)
Pt ambulated from bed to bathroom. Pt had SOB while ambulating. Pt O2 was 89% and 95% when she sat back down. Pt needed standby assistance when ambulating.

## 2018-06-20 NOTE — Progress Notes (Signed)
Pt alert and oriented x4, no complaints of pain or discomfort.  Bed in low position, call bell within reach.  Bed alarms on and functioning.  Assessment done and charted. Skin assessment done with the charge nurse.  Family in the room with the pt.  Pt noted with SOB,  Will continue to monitor and do hourly rounding throughout the shift.

## 2018-06-20 NOTE — ED Notes (Signed)
Pt states she missed dialysis yesterday because she "felt bad". States now she feels sob, pt with family member. Pt visibly sob, able to speak in sentences but with difficulty catching her breath. Dr. Meda Coffee states pt needs eval in ER. Family member took pt to ER.

## 2018-06-21 ENCOUNTER — Inpatient Hospital Stay (HOSPITAL_COMMUNITY): Payer: Medicare Other

## 2018-06-21 DIAGNOSIS — Z79899 Other long term (current) drug therapy: Secondary | ICD-10-CM | POA: Diagnosis not present

## 2018-06-21 DIAGNOSIS — E8770 Fluid overload, unspecified: Secondary | ICD-10-CM | POA: Diagnosis present

## 2018-06-21 DIAGNOSIS — D631 Anemia in chronic kidney disease: Secondary | ICD-10-CM | POA: Diagnosis present

## 2018-06-21 DIAGNOSIS — N2581 Secondary hyperparathyroidism of renal origin: Secondary | ICD-10-CM | POA: Diagnosis present

## 2018-06-21 DIAGNOSIS — Z794 Long term (current) use of insulin: Secondary | ICD-10-CM | POA: Diagnosis not present

## 2018-06-21 DIAGNOSIS — D539 Nutritional anemia, unspecified: Secondary | ICD-10-CM | POA: Diagnosis present

## 2018-06-21 DIAGNOSIS — E872 Acidosis: Secondary | ICD-10-CM | POA: Diagnosis present

## 2018-06-21 DIAGNOSIS — Z8249 Family history of ischemic heart disease and other diseases of the circulatory system: Secondary | ICD-10-CM | POA: Diagnosis not present

## 2018-06-21 DIAGNOSIS — D696 Thrombocytopenia, unspecified: Secondary | ICD-10-CM | POA: Diagnosis present

## 2018-06-21 DIAGNOSIS — Z90710 Acquired absence of both cervix and uterus: Secondary | ICD-10-CM | POA: Diagnosis not present

## 2018-06-21 DIAGNOSIS — E8729 Other acidosis: Secondary | ICD-10-CM | POA: Diagnosis present

## 2018-06-21 DIAGNOSIS — I5031 Acute diastolic (congestive) heart failure: Secondary | ICD-10-CM

## 2018-06-21 DIAGNOSIS — I340 Nonrheumatic mitral (valve) insufficiency: Secondary | ICD-10-CM | POA: Diagnosis not present

## 2018-06-21 DIAGNOSIS — Z8542 Personal history of malignant neoplasm of other parts of uterus: Secondary | ICD-10-CM | POA: Diagnosis not present

## 2018-06-21 DIAGNOSIS — Z6841 Body Mass Index (BMI) 40.0 and over, adult: Secondary | ICD-10-CM | POA: Diagnosis not present

## 2018-06-21 DIAGNOSIS — Z79891 Long term (current) use of opiate analgesic: Secondary | ICD-10-CM | POA: Diagnosis not present

## 2018-06-21 DIAGNOSIS — Z841 Family history of disorders of kidney and ureter: Secondary | ICD-10-CM | POA: Diagnosis not present

## 2018-06-21 DIAGNOSIS — N186 End stage renal disease: Secondary | ICD-10-CM | POA: Diagnosis present

## 2018-06-21 DIAGNOSIS — I10 Essential (primary) hypertension: Secondary | ICD-10-CM | POA: Diagnosis not present

## 2018-06-21 DIAGNOSIS — Z6837 Body mass index (BMI) 37.0-37.9, adult: Secondary | ICD-10-CM | POA: Diagnosis not present

## 2018-06-21 DIAGNOSIS — Z992 Dependence on renal dialysis: Secondary | ICD-10-CM | POA: Diagnosis not present

## 2018-06-21 DIAGNOSIS — E875 Hyperkalemia: Secondary | ICD-10-CM | POA: Diagnosis present

## 2018-06-21 DIAGNOSIS — I132 Hypertensive heart and chronic kidney disease with heart failure and with stage 5 chronic kidney disease, or end stage renal disease: Secondary | ICD-10-CM | POA: Diagnosis present

## 2018-06-21 LAB — CBC
HCT: 30.1 % — ABNORMAL LOW (ref 36.0–46.0)
Hemoglobin: 9.9 g/dL — ABNORMAL LOW (ref 12.0–15.0)
MCH: 33.4 pg (ref 26.0–34.0)
MCHC: 32.9 g/dL (ref 30.0–36.0)
MCV: 101.7 fL — ABNORMAL HIGH (ref 80.0–100.0)
Platelets: 124 10*3/uL — ABNORMAL LOW (ref 150–400)
RBC: 2.96 MIL/uL — ABNORMAL LOW (ref 3.87–5.11)
RDW: 13.7 % (ref 11.5–15.5)
WBC: 6 10*3/uL (ref 4.0–10.5)
nRBC: 0 % (ref 0.0–0.2)

## 2018-06-21 LAB — RENAL FUNCTION PANEL
ALBUMIN: 3.4 g/dL — AB (ref 3.5–5.0)
Anion gap: 18 — ABNORMAL HIGH (ref 5–15)
BUN: 34 mg/dL — AB (ref 8–23)
CO2: 22 mmol/L (ref 22–32)
Calcium: 8.6 mg/dL — ABNORMAL LOW (ref 8.9–10.3)
Chloride: 95 mmol/L — ABNORMAL LOW (ref 98–111)
Creatinine, Ser: 6.94 mg/dL — ABNORMAL HIGH (ref 0.44–1.00)
GFR calc Af Amer: 6 mL/min — ABNORMAL LOW (ref >60)
GFR calc non Af Amer: 5 mL/min — ABNORMAL LOW (ref >60)
Glucose, Bld: 126 mg/dL — ABNORMAL HIGH (ref 70–99)
POTASSIUM: 3.7 mmol/L (ref 3.5–5.1)
Phosphorus: 5 mg/dL — ABNORMAL HIGH (ref 2.5–4.6)
Sodium: 135 mmol/L (ref 135–145)

## 2018-06-21 LAB — ECHOCARDIOGRAM COMPLETE
Height: 61 in
Weight: 3149.93 oz

## 2018-06-21 LAB — MAGNESIUM: Magnesium: 1.7 mg/dL (ref 1.7–2.4)

## 2018-06-21 NOTE — Progress Notes (Signed)
PROGRESS NOTE   [name]  ZJQ:[id number]    DOB: 1940/12/08    DOA: 06/20/2018  PCP: Levin Erp, MD   I have briefly reviewed patients previous medical records in [facility].  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  She was admitted for ESRD complicated by hyperkalemia, anion gap metabolic acidosis, volume overload/pulmonary edema.  Nephrology was consulted, underwent HD on night of admission but needs serial HD due to persisting volume overload.   Assessment & Plan:   Principal Problem:   Hyperkalemia Active Problems:   ESRD (end stage renal disease) (HCC)   Volume overload   High anion gap metabolic acidosis   Essential hypertension   1. Hyperkalemia: Likely due to missed HD appointment on 2/17.  As per nephrology consultation, patient usually compliant with dialysis but missed appointment due to sinusitis-like symptoms.  Presented with potassium of 6.7.  Treated in ED with calcium gluconate, bicarbonate, insulin dextrose and then underwent emergent HD overnight.  Hyperkalemia resolved.  Of note patient also on ARB, decision to DC deferred to nephrology. 2. AG metabolic acidosis: Secondary to ESRD.  Resolved after HD last night. 3. Acute diastolic CHF/ESRD with volume overload: Unusual for patient to present with such significant dyspnea and volume overload after missing just 1 cycle of HD.  No echo in system, ordered.  Status post HD overnight 2/18 but did not get much fluid off due to cramping, volume status has improved but still has symptomatic volume overload.  As discussed with nephrology, require serial HD, next on 2/20. 4. ESRD on MWF HD: Nephrology consulted for dialysis needs and management as  above. 5. Essential hypertension: Controlled.  Volume management across HD.  Continue prior home medications including losartan, nebivolol and remains on Lasix. 6. Macrocytic anemia: MCV 101.7.  Unclear etiology.  As per nephrology, due for ESA this week and will be given tomorrow at dialysis.  Anemia stable. 7. Thrombocytopenia: Unclear etiology.  Follow CBC in a.m. 8. Morbid obesity/Body mass index is 37.2 kg/m.    DVT prophylaxis: Subcutaneous heparin Code Status: Full Family Communication: None at bedside Disposition: Patient to remain in the hospital overnight for serial HD to be done on 2/20.  Thereby changed patient status from observation to inpatient status.   Consultants:  Nephrology  Procedures:  HD overnight 2/18  Antimicrobials:  None   Subjective: Patient reports that she feels much better.  Dyspnea improved.  Still with dyspnea on exertion when she worked with PT this morning.  No chest pain, palpitations, dizziness or lightheadedness  ROS: As above, otherwise negative.  Objective:  Vitals:   06/21/18 0252 06/21/18 0351 06/21/18 0535 06/21/18 0944  BP: (!) 140/39 (!) 121/47 (!) 124/53 (!) 157/72  Pulse: 85 75 75 81  Resp: (!) 27 (!) 23 (!) 22 20  Temp:  98.6 F (37 C) 99.3 F (37.4 C) 99.5 F (37.5 C)  TempSrc:  Oral Oral Oral  SpO2: 99% 100% 98% 98%  Weight:  89.3 kg    Height:        Examination:  General exam: Does not elderly female, moderately built and morbidly obese, seen ambulating comfortably with PT supervision.  Subsequently sitting comfortably in reclining chair. Respiratory system: Occasional bibasilar crackles but  otherwise clear to auscultation. Respiratory effort normal. Cardiovascular system: S1 & S2 heard, RRR. No murmurs, rubs, gallops or clicks.  JVD +.  Trace bilateral ankle edema.  Telemetry personally reviewed: Sinus rhythm. Gastrointestinal system: Abdomen is nondistended, soft and nontender. No organomegaly or masses felt.  Normal bowel sounds heard. Central nervous system: Alert and oriented. No focal neurological deficits. Extremities: Symmetric 5 x 5 power. Skin: No rashes, lesions or ulcers Psychiatry: Judgement and insight appear normal. Mood & affect appropriate.     Data Reviewed: I have personally reviewed following labs and imaging studies  CBC: Recent Labs  Lab 06/20/18 1600 06/21/18 1110  WBC 6.1 6.0  HGB 10.3* 9.9*  HCT 34.0* 30.1*  MCV 110.0* 101.7*  PLT 153 078*   Basic Metabolic Panel: Recent Labs  Lab 06/20/18 1600 06/21/18 0453  NA 142 135  K 6.7* 3.7  CL 109 95*  CO2 15* 22  GLUCOSE 88 126*  BUN 97* 34*  CREATININE 13.15* 6.94*  CALCIUM 8.4* 8.6*  MG  --  1.7  PHOS  --  5.0*   Liver Function Tests: Recent Labs  Lab 06/21/18 0453  ALBUMIN 3.4*   CBG: Recent Labs  Lab 06/20/18 1954  GLUCAP 166*    No results found for this or any previous visit (from the past 240 hour(s)).       Radiology Studies: Dg Chest 2 View  Result Date: 06/20/2018 CLINICAL DATA:  Shortness of breath EXAM: CHEST - 2 VIEW COMPARISON:  03/19/2016 FINDINGS: Small bilateral pleural effusion. Bilateral lower lung zone airspace disease. Cardiomegaly with vascular congestion. Aortic atherosclerosis. No pneumothorax. IMPRESSION: 1. Cardiomegaly with vascular congestion and mild interstitial edema. There are small pleural effusions. 2. Airspace disease at the bilateral lung bases right greater than left, suspect for superimposed pneumonia Electronically Signed   By: Donavan Foil M.D.   On: 06/20/2018 16:41        Scheduled Meds: . calcitRIOL  0.5 mcg Oral BID  . calcium acetate  667 mg Oral TID WC  . Chlorhexidine Gluconate Cloth  6 each Topical Q0600  . furosemide  160 mg Oral BID  . heparin  5,000 Units Subcutaneous Q8H  . losartan  50 mg Oral QHS  . nebivolol  10 mg Oral QHS  . tobramycin  1 drop Right Eye Q4H   Continuous Infusions: . sodium chloride    . sodium chloride         LOS: 0 days     Vernell Leep, MD, FACP, [facility]. [facility]  To contact the attending provider between 7A-7P or the covering provider during after hours 7P-7A, please log into the web site [email or web address] and access using universal [place] password for that web site. If you do not have the password, please call the hospital operator.  06/21/2018, 1:41 PM

## 2018-06-21 NOTE — Care Management Obs Status (Signed)
[place] NOTIFICATION   Patient Details  Name: [name] MRN: [id number] Date of Birth: March 17, 1941   Medicare Observation Status Notification Given:  Yes    Bartholomew Crews, RN 06/21/2018, 12:29 PM

## 2018-06-21 NOTE — Progress Notes (Addendum)
3810 Patient back from HD. C/O cramping, but stated it has decreased and pain in her left arm. Arm assessed and elevated on pillow. No other complaints at this time, fall precautions in place, [facility].   0545 Pt medicated per MAR, Dr. Velia Meyer at bedside, NAD, no complaints, fall precautions in place, [facility].

## 2018-06-21 NOTE — Progress Notes (Signed)
Brief note regarding plan, with full H&P to follow:   [name] is a 78 y.o. female with medical history significant for end-stage renal disease on hemodialysis in the setting of nephritic syndrome, hypertension, anemia of chronic kidney disease, who was admitted to [facility] on 06/20/2018 with need for expedited hemodialysis in the setting of hyperkalemia, volume overload, and metabolic acidosis after presenting from home to [facility] emergency department complaining of shortness of breath after missing yesterday's hemodialysis session.    #) Hyperkalemia: Presenting labs reflect serum potassium of 6.7 without corresponding EKG changes.  In tandem with presenting evidence of volume overload as well as anion gap metabolic acidosis, this appears to be on the basis of missing yesterday scheduled hemodialysis session in the context of end-stage renal disease.  Case was discussed with the on-call nephrologist, Dr. Arvo Ealy Mend, who recommended admission to the hospital service with plan for HD overnight.  In the meantime, medical management for hyperkalemia has been initiated, the patient has received calcium gluconate, IV insulin, and sodium bicarbonate.  Plan: Nephrology consulted, with ensuing plan for hemodialysis tonight.  Monitor on telemetry.  Repeat BMP in the morning.     #) End-stage renal disease: In the setting of a history of nephritic syndrome, the patient is on scheduled Monday, Wednesday, Friday hemodialysis, although she reports her scheduled hemodialysis on 06/19/2018 in the setting of fatigue, which appears to be the driving force behind this evening's presentation involving evidence of volume overload, hyperkalemia, and metabolic acidosis warranting hemodialysis tonight, per nephrology, is further described above.   Plan: HD tonight, as above.  Repeat BMP, magnesium, and phosphorus in the morning.  Emphasized to patient importance of outstanding compliance in attending scheduled  hemodialysis sessions.  Will resume home calcitriol and PhosLo.  Monitor strict I's and O's.    Babs Bertin, DO Hospitalist

## 2018-06-21 NOTE — Progress Notes (Addendum)
Physical Therapy Evaluation/ Discharge Patient Details Name: [name] MRN: [id number] DOB: 05/18/1940 Today's Date: 06/21/2018   History of Present Illness  Patient is 78 y/o female presenting to hospital with SOB after missing diaylsis appointment on Monday. Patient had hyperkalemia, volume overload, and metabolic acidosis. PMH includes ESRD on HD MWF, HTN, anemia, cancer, and CKD.  Clinical Impression  Patient admitted to hospital secondary to problems above and with deficits below. Patient required supervision-min guard for ambulation with no AD. Patient with SOB following ambulation however oxygen saturations WFL. Educated patient about energy conservation techniques following discharge. No further acute PT needs identified. All education has been completed and the patient has no further questions. See below for any follow-up Physical Therapy or equipment needs. PT is signing off. Thank you for this referral. If needs change, please re-consult.      Follow Up Recommendations Home health PT(for safety eval)    Equipment Recommendations  None recommended by PT    Recommendations for Other Services       Precautions / Restrictions Precautions Precautions: None Restrictions Weight Bearing Restrictions: No      Mobility  Bed Mobility               General bed mobility comments: Patient in chair upon arrival  Transfers Overall transfer level: Needs assistance Equipment used: None Transfers: Sit to/from Stand Sit to Stand: Supervision         General transfer comment: Patient required supervision to stand for safety.   Ambulation/Gait Ambulation/Gait assistance: Supervision;Min guard Gait Distance (Feet): 200 Feet Assistive device: None Gait Pattern/deviations: Step-through pattern;Decreased step length - right;Decreased step length - left;Decreased stride length Gait velocity: decreased Gait velocity interpretation: 1.31 - 2.62 ft/sec, indicative of limited  community ambulator General Gait Details: Patient required supervision to min guard during ambulation for safety. Patient reports decreased gait speed when compared to baseline.  No LOB during dynamic gait tasks. Patient with increased SOB following ambulation. Oxygen saturations 90% on RA after ambulation.   Stairs            Wheelchair Mobility    Modified Rankin (Stroke Patients Only)       Balance Overall balance assessment: Needs assistance Sitting-balance support: No upper extremity supported;Feet supported Sitting balance-Leahy Scale: Good     Standing balance support: No upper extremity supported Standing balance-Leahy Scale: Good Standing balance comment: No LOB during static and dynamic gait tasks                 Standardized Balance Assessment Standardized Balance Assessment : Dynamic Gait Index   Dynamic Gait Index Level Surface: Normal Gait with Horizontal Head Turns: Mild Impairment Gait with Vertical Head Turns: Mild Impairment Step Over Obstacle: Mild Impairment Step Around Obstacles: Normal       Pertinent Vitals/Pain Pain Assessment: No/denies pain    Home Living Family/patient expects to be discharged to:: Private residence Living Arrangements: Alone Available Help at Discharge: Family;Available PRN/intermittently Type of Home: Apartment Home Access: Level entry     Home Layout: One level Home Equipment: None      Prior Function Level of Independence: Independent               Hand Dominance        Extremity/Trunk Assessment   Upper Extremity Assessment Upper Extremity Assessment: Overall WFL for tasks assessed    Lower Extremity Assessment Lower Extremity Assessment: Overall WFL for tasks assessed    Cervical / Trunk Assessment Cervical / Trunk  Assessment: Normal  Communication   Communication: No difficulties  Cognition Arousal/Alertness: Awake/alert Behavior During Therapy: WFL for tasks  assessed/performed Overall Cognitive Status: Within Functional Limits for tasks assessed                                        General Comments General comments (skin integrity, edema, etc.): Educated about energy conservation techniques following d/c. Oxygen saturations 90% on RA following ambulation.     Exercises     Assessment/Plan    PT Assessment Patent does not need any further PT services  PT Problem List         PT Treatment Interventions      PT Goals (Current goals can be found in the Care Plan section)  Acute Rehab PT Goals Patient Stated Goal: get back to the gym PT Goal Formulation: With patient Time For Goal Achievement: 07/05/18 Potential to Achieve Goals: Good    Frequency     Barriers to discharge        Co-evaluation               AM-PAC PT "6 Clicks" Mobility  Outcome Measure Help needed turning from your back to your side while in a flat bed without using bedrails?: None Help needed moving from lying on your back to sitting on the side of a flat bed without using bedrails?: None Help needed moving to and from a bed to a chair (including a wheelchair)?: None Help needed standing up from a chair using your arms (e.g., wheelchair or bedside chair)?: None Help needed to walk in hospital room?: A Little Help needed climbing 3-5 steps with a railing? : A Little 6 Click Score: 22    End of Session Equipment Utilized During Treatment: Gait belt Activity Tolerance: Patient tolerated treatment well Patient left: in chair;with call bell/phone within reach Nurse Communication: Mobility status PT Visit Diagnosis: Unsteadiness on feet (R26.81);Other abnormalities of gait and mobility (R26.89)    Time: 6861-6837 PT Time Calculation (min) (ACUTE ONLY): 17 min   Charges:   PT Evaluation $PT Eval Low Complexity: 1 Low          Erick Blinks, SPT  Erick Blinks 06/21/2018, 12:05 PM

## 2018-06-21 NOTE — Progress Notes (Signed)
  Echocardiogram 2D Echocardiogram has been performed.  [name] 06/21/2018, 3:24 PM

## 2018-06-21 NOTE — Progress Notes (Signed)
HD tx ended 28 min early @ 0252 d/t severe cramping throughout most of tx UF goal not met Blood rinsed back VSS Pt states cramping was relieved by the extra volume rinse back Report called to Janine Ores, RN

## 2018-06-21 NOTE — Care Management Note (Signed)
Case Management Note Manya Silvas, RN MSN CCM Transitions of Care [address] [place] [phone]  Patient Details  Name: [name] MRN: [id number] Date of Birth: 18-May-1940  Subjective/Objective:       Hyperkalemia             Action/Plan: Spoke with patient at bedside. PTA pt is home alone. Independent. Still drives. States that she has no children, but has a great niece and her mother and her daughter who check on her regularly. Gets prescriptions filled at [facility]. Discussed HHPT recommendations. Pt declines referral at this time stating that she [facility] to [facility] [employer] program offered by her insurance. No transition of care needs identified.   Expected Discharge Date:  06/21/18               Expected Discharge Plan:  Home/Self Care  In-House Referral:  NA  Discharge planning Services  CM Consult  Post Acute Care Choice:  NA Choice offered to:  Patient  DME Arranged:  N/A DME Agency:  NA  HH Arranged:  PT(Pt declined HHPT) [place] Agency:  NA  Status of Service:  In process, will continue to follow  If discussed at Long Length of Stay Meetings, dates discussed:    Additional Comments:  Bartholomew Crews, RN 06/21/2018, 12:23 PM

## 2018-06-21 NOTE — Consult Note (Addendum)
[facility] Renal Consultation Note    Indication for Consultation:  Management of ESRD/hemodialysis, anemia, hypertension/volume, and secondary hyperparathyroidism. PCP:  HPI: [name] is a 78 y.o. 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   Historically, she is very compliant with her dialysis. Missed treatment on Monday d/t sinusitis symptoms - pressure, headache, and fatigue. The next morning (Tuesday), she noticed increasing dyspnea throughout the day. Presented to ED for evaluation, vitals were normal range. Labs showed K 6.7, CO2 15, WBC 6, Hgb 10.3. CXR consistent with interstitial edema and small B pleural effusions. She was treated with CaGluc, bicarb, D50/insulin and then was brought up for dialysis overnight - had significant cramping in legs throughout entire treatment - still managed to get 2.5L net UF. Potassium improved this morning.  Today, she feels much better. Denies CP or dyspnea while in bed - but did have some SOB to stand beside the bed (minimal exertion). No N/V/diarrhea. No fever/chills.  Past Medical History:  Diagnosis Date  . Anemia   . Arthritis   . Cancer ([place])    urterine- surgical removal only tratment  . Chronic kidney disease    end stage 4  . Environmental and seasonal allergies    affects sinuses  . Heart murmur    no one has mentioned it lately- had it as a child  . Hypertension   . Limb cramps    leg, thigh, hands  . Pneumonia    "young"  . Shortness of breath dyspnea    With exerion   Past Surgical History:  Procedure Laterality Date  . ABDOMINAL HYSTERECTOMY     complete  . AV FISTULA PLACEMENT Left 01/23/2016   Procedure: LEFT RADIOCEPHALIC  ARTERIOVENOUS (AV) FISTULA CREATION;  Surgeon: Serafina Mitchell, MD;  Location: [facility] OR;  Service: Vascular;  Laterality: Left;  . AV FISTULA PLACEMENT Left 03/09/2016   Procedure: ARTERIOVENOUS (AV) FISTULA  CREATION-LEFT UPPER ARM;  Surgeon: Conrad Ada, MD;  Location: [facility];  Service: Vascular;  Laterality: Left;  . BREAST SURGERY Bilateral    4 surgeries, Cyst in each breast  . COLONOSCOPY W/ POLYPECTOMY    . DILATION AND CURETTAGE OF UTERUS    . INSERTION OF DIALYSIS CATHETER N/A 03/09/2016   Procedure: INSERTION OF DIALYSIS CATHETER;  Surgeon: Conrad Greenbrier, MD;  [facility];  Service: Vascular;  Laterality: N/A;   Family History  Problem Relation Age of Onset  . Cancer - Lung Mother   . Renal Disease Father   . Heart attack Father    Social History:  reports that she has never smoked. She has never used smokeless tobacco. She reports that she does not drink alcohol or use drugs.  ROS: As per HPI otherwise negative.  Physical Exam: Vitals:   06/21/18 0252 06/21/18 0351 06/21/18 0535 06/21/18 0944  BP: (!) 140/39 (!) 121/47 (!) 124/53 (!) 157/72  Pulse: 85 75 75 81  Resp: (!) 27 (!) 23 (!) 22 20  Temp:  98.6 F (37 C) 99.3 F (37.4 C) 99.5 F (37.5 C)  TempSrc:  Oral Oral Oral  SpO2: 99% 100% 98% 98%  Weight:  89.3 kg    Height:         General: Overweight woman, NAD. Wearing nasal oxygen. Head: Normocephalic, atraumatic, sclera non-icteric, mucus membranes are moist. Neck: Supple without lymphadenopathy/masses. JVD not elevated. Lungs: Clear bilaterally to auscultation without wheezes, rales, or  rhonchi. Breathing was labored with minimal exertion (standing beside bed) Heart: RRR with normal S1, S2. No murmurs, rubs, or gallops appreciated. Abdomen: Soft, non-tender, non-distended with normoactive bowel sounds. No rebound/guarding. No obvious abdominal masses. Musculoskeletal:  Strength and tone appear normal for age. Lower extremities: 1+ LE edema bilaterally Neuro: Alert and oriented X 3. Moves all extremities spontaneously. Psych:  Responds to questions appropriately with a normal affect. Dialysis Access: L AVF + thrill  Allergies  Allergen Reactions  . No Known  Allergies    Prior to Admission medications   Medication Sig Start Date End Date Taking? Authorizing Provider  calcitRIOL (ROCALTROL) 0.5 MCG capsule Take 1 capsule by mouth 2 (two) times daily. 11/26/15   [provider]  calcium acetate (PHOSLO) 667 MG capsule Take 667 mg by mouth 3 (three) times daily with meals.  01/12/16   [provider]  furosemide (LASIX) 80 MG tablet Take 160 mg by mouth 2 (two) times daily.    [provider]  losartan (COZAAR) 50 MG tablet Take 1 tablet by mouth at bedtime. 12/19/15   [provider]  nebivolol (BYSTOLIC) 10 MG tablet Take 10 mg by mouth at bedtime.     [provider]  oxyCODONE-acetaminophen (ROXICET) 5-325 MG tablet Take 1 tablet by mouth every 6 (six) hours as needed for moderate pain. 03/09/16   Conrad Walton, MD  tobramycin (TOBREX) 0.3 % ophthalmic solution Place 1 drop into the right eye every 4 (four) hours. 08/19/16   Robyn Haber, MD   Current Facility-Administered Medications  Medication Dose Route Frequency Provider Last Rate Last Dose  . 0.9 %  sodium chloride infusion  100 mL Intravenous PRN Edrick Oh, MD      . 0.9 %  sodium chloride infusion  100 mL Intravenous PRN Edrick Oh, MD      . alteplase (CATHFLO ACTIVASE) injection 2 mg  2 mg Intracatheter Once PRN Edrick Oh, MD      . calcitRIOL (ROCALTROL) capsule 0.5 mcg  0.5 mcg Oral BID Howerter, Justin B, DO   0.5 mcg at 06/21/18 0837  . calcium acetate (PHOSLO) capsule 667 mg  667 mg Oral TID WC Howerter, Justin B, DO   667 mg at 06/21/18 0745  . Chlorhexidine Gluconate Cloth 2 % PADS 6 each  6 each Topical Q0600 Howerter, Justin B, DO      . furosemide (LASIX) tablet 160 mg  160 mg Oral BID Howerter, Justin B, DO   160 mg at 06/21/18 0745  . heparin injection 1,000 Units  1,000 Units Dialysis PRN Edrick Oh, MD      . heparin injection 5,000 Units  5,000 Units Subcutaneous Q8H Howerter, Justin B, DO   5,000 Units at 06/21/18 0541   . lidocaine (PF) (XYLOCAINE) 1 % injection 5 mL  5 mL Intradermal PRN Edrick Oh, MD      . lidocaine-prilocaine (EMLA) cream 1 application  1 application Topical PRN Edrick Oh, MD      . losartan (COZAAR) tablet 50 mg  50 mg Oral QHS Howerter, Justin B, DO      . nebivolol (BYSTOLIC) tablet 10 mg  10 mg Oral QHS Howerter, Justin B, DO      . ondansetron (ZOFRAN) tablet 4 mg  4 mg Oral Q6H PRN Howerter, Justin B, DO       Or  . ondansetron (ZOFRAN) injection 4 mg  4 mg Intravenous Q6H PRN Howerter, Justin B, DO      .  oxyCODONE-acetaminophen (PERCOCET/ROXICET) 5-325 MG per tablet 1 tablet  1 tablet Oral Q6H PRN Howerter, Justin B, DO      . pentafluoroprop-tetrafluoroeth (GEBAUERS) aerosol 1 application  1 application Topical PRN Edrick Oh, MD      . tobramycin (TOBREX) 0.3 % ophthalmic solution 1 drop  1 drop Right Eye Q4H Howerter, Justin B, DO   1 drop at 06/21/18 0541   Labs: Basic Metabolic Panel: Recent Labs  Lab 06/20/18 1600 06/21/18 0453  NA 142 135  K 6.7* 3.7  CL 109 95*  CO2 15* 22  GLUCOSE 88 126*  BUN 97* 34*  CREATININE 13.15* 6.94*  CALCIUM 8.4* 8.6*  PHOS  --  5.0*   Liver Function Tests: Recent Labs  Lab 06/21/18 0453  ALBUMIN 3.4*   CBC: Recent Labs  Lab 06/20/18 1600  WBC 6.1  HGB 10.3*  HCT 34.0*  MCV 110.0*  PLT 153   Studies/Results: Dg Chest 2 View  Result Date: 06/20/2018 CLINICAL DATA:  Shortness of breath EXAM: CHEST - 2 VIEW COMPARISON:  03/19/2016 FINDINGS: Small bilateral pleural effusion. Bilateral lower lung zone airspace disease. Cardiomegaly with vascular congestion. Aortic atherosclerosis. No pneumothorax. IMPRESSION: 1. Cardiomegaly with vascular congestion and mild interstitial edema. There are small pleural effusions. 2. Airspace disease at the bilateral lung bases right greater than left, suspect for superimposed pneumonia Electronically Signed   By: Donavan Foil M.D.   On: 06/20/2018 16:41    Dialysis Orders:  MWF at  [facility] 4hr, 400/800, EDW 87kg, 2K/2.5Ca, Profile 4, AVF, heparin 2000 bolus - Mircera 100 q 2 weeks (last given 75 on 2/5) - Parsabiv 2.5mg  IV q HD - Calcitriol 87mcg PO q HD  Assessment/Plan: 1.  Dyspnea/volume overload: S/p HD overnight 2/18 - did not get much off d/t cramping, still with some DOE - she should stay inpatient for another HD tomorrow morning to try to improve this further. 2.  Hyperkalemia: K 6.7 on admit - resolved with HD 3.  ESRD: Usual MWF - missed Monday, s/p HD Tues d/t ^K/fluid - will plan for another HD tomorrow, then back to usual HD schedule. 4.  Hypertension/volume: BP variable, + pulm edema on CXR - more UF as tolerated. 5.  Anemia: Hgb 10.3 - due for ESA this week - will give tomorrow. 6.  Metabolic bone disease: Ca/Phos ok. Continue home meds.   Veneta Penton, PA-C 06/21/2018, 10:37 AM  [facility] Pager: [phone]  Pt seen, examined and agree w A/P as above.  [facility] 06/21/2018, 3:50 PM

## 2018-06-21 NOTE — Progress Notes (Signed)
[facility] Renal Quick Note:  Pt presented to ED last night with hyperkalemia - admitted OBS status and dialyzed overnight. K WNL this morning. Hopefully she can be discharged this morning. Pls page renal team if she will be staying longer and will do full consult.  Veneta Penton, PA-C [employer] Pager [phone]

## 2018-06-21 NOTE — Progress Notes (Signed)
HD tx initiated via 15Gx2 w/o problem Pull/push/flush well w/o problem VSS Will continue to monitor while on HD tx 

## 2018-06-22 LAB — RENAL FUNCTION PANEL
Albumin: 3.3 g/dL — ABNORMAL LOW (ref 3.5–5.0)
Anion gap: 16 — ABNORMAL HIGH (ref 5–15)
BUN: 63 mg/dL — ABNORMAL HIGH (ref 8–23)
CO2: 21 mmol/L — ABNORMAL LOW (ref 22–32)
Calcium: 8.5 mg/dL — ABNORMAL LOW (ref 8.9–10.3)
Chloride: 99 mmol/L (ref 98–111)
Creatinine, Ser: 10.91 mg/dL — ABNORMAL HIGH (ref 0.44–1.00)
GFR calc non Af Amer: 3 mL/min — ABNORMAL LOW (ref >60)
GFR, EST AFRICAN AMERICAN: 4 mL/min — AB (ref >60)
Glucose, Bld: 101 mg/dL — ABNORMAL HIGH (ref 70–99)
Phosphorus: 7.3 mg/dL — ABNORMAL HIGH (ref 2.5–4.6)
Potassium: 5.3 mmol/L — ABNORMAL HIGH (ref 3.5–5.1)
Sodium: 136 mmol/L (ref 135–145)

## 2018-06-22 LAB — CBC
HCT: 28.5 % — ABNORMAL LOW (ref 36.0–46.0)
Hemoglobin: 9.5 g/dL — ABNORMAL LOW (ref 12.0–15.0)
MCH: 34.8 pg — ABNORMAL HIGH (ref 26.0–34.0)
MCHC: 33.3 g/dL (ref 30.0–36.0)
MCV: 104.4 fL — ABNORMAL HIGH (ref 80.0–100.0)
Platelets: 130 10*3/uL — ABNORMAL LOW (ref 150–400)
RBC: 2.73 MIL/uL — ABNORMAL LOW (ref 3.87–5.11)
RDW: 13.5 % (ref 11.5–15.5)
WBC: 5.4 10*3/uL (ref 4.0–10.5)
nRBC: 0 % (ref 0.0–0.2)

## 2018-06-22 MED ORDER — HEPARIN SODIUM (PORCINE) 1000 UNIT/ML DIALYSIS
20.0000 [IU]/kg | INTRAMUSCULAR | Status: DC | PRN
  Administered 2018-06-22: 1800 [IU] via INTRAVENOUS_CENTRAL
  Filled 2018-06-22: qty 1.8

## 2018-06-22 MED ORDER — HEPARIN SODIUM (PORCINE) 1000 UNIT/ML IJ SOLN
INTRAMUSCULAR | Status: AC
  Filled 2018-06-22: qty 2

## 2018-06-22 MED ORDER — ACETAMINOPHEN 325 MG PO TABS
ORAL_TABLET | ORAL | Status: AC
  Filled 2018-06-22: qty 2

## 2018-06-22 MED ORDER — CALCITRIOL 0.5 MCG PO CAPS
ORAL_CAPSULE | ORAL | Status: AC
  Administered 2018-06-22: 0.5 ug via ORAL
  Filled 2018-06-22: qty 1

## 2018-06-22 MED ORDER — ACETAMINOPHEN 325 MG PO TABS
650.0000 mg | ORAL_TABLET | Freq: Four times a day (QID) | ORAL | Status: DC | PRN
  Administered 2018-06-22: 650 mg via ORAL

## 2018-06-22 NOTE — Discharge Summary (Signed)
Physician Discharge Summary  [name] MLY:[id number] DOB: 1941-02-03  PCP: Levin Erp, MD  Admit date: 06/20/2018 Discharge date: 06/22/2018  Recommendations for Outpatient Follow-up:  1. Dr. Levin Erp, PCP in 1 week. 2. Hemodialysis Center: Continue regular hemodialysis appointments on Mondays, Wednesdays and Fridays.  Follow repeat labs (renal panel and CBC) periodically across HD.  Home Health: Patient declined home health PT. As per case management, she goes to [facility] for the [employer] offered by her insurance. Equipment/Devices: None  Discharge Condition: Improved and stable CODE STATUS: Full Diet recommendation: Heart healthy diet.  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  She was admitted for ESRD complicated by hyperkalemia, anion gap metabolic acidosis, volume overload/pulmonary edema.  Nephrology was consulted and she underwent serial hemodialysis.   Assessment & Plan:  1. Hyperkalemia: Likely due to missed HD appointment on 2/17.  As per nephrology consultation, patient usually compliant with dialysis but missed appointment due to sinusitis-like symptoms.  Presented with potassium of 6.7.  Treated in ED with calcium gluconate, bicarbonate, insulin dextrose and then underwent emergent HD overnight.  Hyperkalemia had resolved but mildly elevated today at 5.3.    Patient was on losartan prior to admission, I discussed with Dr. Jonnie Finner, Nephrology who recommended discontinuing losartan and  Lasix at discharge.  Patient underwent hemodialysis this morning which should have addressed this morning's high potassium.  Periodically follow BMP at outpatient dialysis.  As discussed with Nephrology, cleared for discharge after dialysis today. 2. AG metabolic acidosis: Secondary to ESRD.    Underwent serial HD x2 and resolved. 3. Acute diastolic CHF/ESRD with volume overload: Unusual for patient to present with such significant dyspnea and volume overload after missing just 1 cycle of HD.  No echo in system and hence check TTE: LVEF 60-65%, detailed report as noted below.  Patient underwent serial HD on 2/18 night and again 2/20.  Volume status is significantly improved.  As discussed with nephrology, cleared for discharge with close outpatient follow-up at regular hemodialysis.  Patient counseled regarding importance of keeping her dialysis appointments and she verbalized understanding.  Nephrology recommended stopping Lasix. 4. ESRD on MWF HD: Nephrology consulted for dialysis needs and management as above. 5. Essential hypertension: Controlled.  Volume management across HD.  Blood pressures controlled.   Continue beta-blockers alone at discharge, close outpatient follow-up and adjust medications as needed.  Discontinued losartan and Lasix. 6. Macrocytic anemia: MCV 101.7.  Unclear etiology.  As per nephrology, due for ESA this week and was to be given at dialysis today.  Stable. 7. Thrombocytopenia: Unclear etiology.  Platelets slightly better today.  Close and periodic follow-up at CBC across outpatient dialysis. 8. Morbid obesity/Body mass index is 37.2 kg/m. 9. Secondary hyperparathyroidism: Continue PhosLo and further management per Nephrology as outpatient.    Consultants:  Nephrology  Procedures:  HD overnight 2/18 and 2/20   Discharge Instructions  Discharge Instructions    (HEART FAILURE PATIENTS) Call MD:  Anytime you have any of the following symptoms: 1) 3 pound weight  gain in  24 hours or 5 pounds in 1 week 2) shortness of breath, with or without a dry hacking cough 3) swelling in the hands, feet or stomach 4) if you have to sleep on extra pillows at night in order to breathe.   Complete by:  As directed    Call MD for:  difficulty breathing, headache or visual disturbances   Complete by:  As directed    Call MD for:  extreme fatigue   Complete by:  As directed    Call MD for:  persistant dizziness or light-headedness   Complete by:  As directed    Call MD for:  temperature >100.4   Complete by:  As directed    Diet - low sodium heart healthy   Complete by:  As directed    Increase activity slowly   Complete by:  As directed        Medication List    STOP taking these medications   furosemide 80 MG tablet Commonly known as:  LASIX   losartan 50 MG tablet Commonly known as:  COZAAR     TAKE these medications   calcitRIOL 0.5 MCG capsule Commonly known as:  ROCALTROL Take 1 capsule by mouth 2 (two) times daily.   calcium acetate 667 MG capsule Commonly known as:  PHOSLO Take 1,334-2,001 mg by mouth See admin instructions. 2001mg  three times daily with meals and 1334mg  with two snacks daily   DIALYVITE TABLET Tabs Take 1 tablet by mouth daily.   lidocaine-prilocaine cream Commonly known as:  EMLA Apply 1 application topically daily as needed for pain.   nebivolol 10 MG tablet Commonly known as:  BYSTOLIC Take 10 mg by mouth at bedtime.      Follow-up Information    Levin Erp, MD. Schedule an appointment as soon as possible for a visit in 1 week(s).   Specialty:  Internal Medicine Contact information: [address], [address] [place] [address] [phone]        Hemodialysis Center Follow up.   Why:  Continue regular hemodialysis appointments on Mondays, Wednesdays and Fridays.         No Known Allergies    Procedures/Studies: Dg Chest 2 View  Result Date: 06/20/2018 CLINICAL DATA:  Shortness of  breath EXAM: CHEST - 2 VIEW COMPARISON:  03/19/2016 FINDINGS: Small bilateral pleural effusion. Bilateral lower lung zone airspace disease. Cardiomegaly with vascular congestion. Aortic atherosclerosis. No pneumothorax. IMPRESSION: 1. Cardiomegaly with vascular congestion and mild interstitial edema. There are small pleural effusions. 2. Airspace disease at the bilateral lung bases right greater than left, suspect for superimposed pneumonia Electronically Signed   By: Donavan Foil M.D.   On: 06/20/2018 16:41      Subjective: Dyspnea resolved and breathing back to baseline.  Still has intermittent mild dry cough.  No chest pain or pain elsewhere.  Denies any other complaints.  Seen this morning at HD.  Discharge Exam:  Vitals:   06/22/18 0930 06/22/18 1000 06/22/18 1030 06/22/18 1100  BP: (!) 114/52 (!) 119/52 (!) 103/47 123/60  Pulse: (!) 48 (!) 49 (!) 49 (!) 51  Resp:      Temp:      TempSrc:      SpO2:      Weight:      Height:      Temperature 98.8 F, RR 18/min, oxygen saturation 96% on room air.  General exam:  Pleasant elderly female, moderately built and morbidly obese, lying comfortably supine in bed and  undergoing HD this morning. Respiratory system:  Slightly diminished breath sounds in the bases with occasional basal crackles but rest of lung fields clear to auscultation.  No increased work of breathing. Cardiovascular system: S1 & S2 heard, RRR. No JVD, murmurs, gallops, clicks or pedal edema. Gastrointestinal system: Abdomen is nondistended, soft and nontender. No organomegaly or masses felt. Normal bowel sounds heard. Central nervous system: Alert and oriented. No focal neurological deficits. Extremities: Symmetric 5 x 5 power. Skin: No rashes, lesions or ulcers Psychiatry: Judgement and insight appear normal. Mood & affect appropriate.     The results of significant diagnostics from this hospitalization (including imaging, microbiology, ancillary and laboratory) are  listed below for reference.      Labs: CBC: Recent Labs  Lab 06/20/18 1600 06/21/18 1110 06/22/18 0659  WBC 6.1 6.0 5.4  HGB 10.3* 9.9* 9.5*  HCT 34.0* 30.1* 28.5*  MCV 110.0* 101.7* 104.4*  PLT 153 124* 270*   Basic Metabolic Panel: Recent Labs  Lab 06/20/18 1600 06/21/18 0453 06/22/18 0815  NA 142 135 136  K 6.7* 3.7 5.3*  CL 109 95* 99  CO2 15* 22 21*  GLUCOSE 88 126* 101*  BUN 97* 34* 63*  CREATININE 13.15* 6.94* 10.91*  CALCIUM 8.4* 8.6* 8.5*  MG  --  1.7  --   PHOS  --  5.0* 7.3*   Liver Function Tests: Recent Labs  Lab 06/21/18 0453 06/22/18 0815  ALBUMIN 3.4* 3.3*   CBG: Recent Labs  Lab 06/20/18 1954  GLUCAP 166*     Time coordinating discharge: 40 minutes  SIGNED:  Vernell Leep, MD, FACP, [facility]. [facility]  To contact the attending provider between 7A-7P or the covering provider during after hours 7P-7A, please log into the web site [email or web address] and access using universal [place] password for that web site. If you do not have the password, please call the hospital operator.

## 2018-06-22 NOTE — Procedures (Signed)
Pt seen and examined on HD.  SOB resolved. Assuming pt is stable on HD today, she can be dc'd after HD.  Will go to her OP HD center tomorrow (MWF patient).  Will consider lowering edw depending on today's session.    I was present at this dialysis session, have reviewed the session itself and made  appropriate changes Kelly Splinter MD [place] pager [phone]   06/22/2018, 12:51 PM

## 2018-06-22 NOTE — Discharge Instructions (Signed)

## 2018-06-22 NOTE — Progress Notes (Signed)
Patient Discharge: Disposition:  Patient discharged to home. Education: Reviewed medications, follow-up appointments and discharge instructions, verbalized understanding. IV: Discontinued IV before discharge. Telemetry: N/A Transportation: Patient escorted out of the unit in w/c. Belongings: Patient took all her belongings with her.

## 2019-07-31 ENCOUNTER — Other Ambulatory Visit: Payer: Self-pay | Admitting: Radiology

## 2020-05-06 ENCOUNTER — Ambulatory Visit (HOSPITAL_COMMUNITY)
Admission: EM | Admit: 2020-05-06 | Discharge: 2020-05-06 | Disposition: A | Discharge status: Home / Self Care | Payer: Medicare PPO | Attending: Urgent Care | Admitting: Urgent Care

## 2020-05-06 ENCOUNTER — Ambulatory Visit (INDEPENDENT_AMBULATORY_CARE_PROVIDER_SITE_OTHER): Payer: Medicare PPO

## 2020-05-06 ENCOUNTER — Other Ambulatory Visit: Payer: Self-pay

## 2020-05-06 ENCOUNTER — Encounter (HOSPITAL_COMMUNITY): Payer: Self-pay

## 2020-05-06 DIAGNOSIS — R059 Cough, unspecified: Secondary | ICD-10-CM | POA: Diagnosis present

## 2020-05-06 DIAGNOSIS — Z992 Dependence on renal dialysis: Secondary | ICD-10-CM | POA: Diagnosis present

## 2020-05-06 DIAGNOSIS — R058 Other specified cough: Secondary | ICD-10-CM | POA: Diagnosis not present

## 2020-05-06 DIAGNOSIS — R0989 Other specified symptoms and signs involving the circulatory and respiratory systems: Secondary | ICD-10-CM

## 2020-05-06 DIAGNOSIS — U071 COVID-19: Secondary | ICD-10-CM | POA: Insufficient documentation

## 2020-05-06 DIAGNOSIS — N186 End stage renal disease: Secondary | ICD-10-CM | POA: Insufficient documentation

## 2020-05-06 MED ORDER — CETIRIZINE HCL 5 MG PO TABS: ORAL_TABLET | ORAL | 0 refills | Status: DC

## 2020-05-06 NOTE — ED Triage Notes (Signed)
Pt presents with productive cough, chills, and diarrhea since yesterday .

## 2020-05-06 NOTE — ED Provider Notes (Signed)
[facility]   MRN: [id number] DOB: February 17, 1941  Subjective:   [name] is a 80 y.o. female with PMH of ESRD on dialysis thrice weekly presenting for 1 day history of acute onset productive cough, chest congestion, chills, diarrhea.  Patient states that she generally has a really difficult time when she is at dialysis due to the cold air from the [facility].  She does have a history of environmental and seasonal allergies.  She is not taking for this because of her dialysis.  Denies chest pain, shortness of breath.  Denies history of lung disorders.  No current facility-administered medications for this encounter.  Current Outpatient Medications:  .  B Complex-C-Folic Acid (DIALYVITE TABLET) TABS, Take 1 tablet by mouth daily., Disp: , Rfl:  .  calcitRIOL (ROCALTROL) 0.5 MCG capsule, Take 1 capsule by mouth 2 (two) times daily., Disp: , Rfl: 3 .  calcium acetate (PHOSLO) 667 MG capsule, Take 1,334-2,001 mg by mouth See admin instructions. 2001mg  three times daily with meals and 1334mg  with two snacks daily, Disp: , Rfl: 6 .  lidocaine-prilocaine (EMLA) cream, Apply 1 application topically daily as needed for pain., Disp: , Rfl:  .  nebivolol (BYSTOLIC) 10 MG tablet, Take 10 mg by mouth at bedtime. , Disp: , Rfl:    No Known Allergies  Past Medical History:  Diagnosis Date  . Anemia   . Arthritis   . Cancer ([place])    urterine- surgical removal only tratment  . Chronic kidney disease    end stage 4  . Environmental and seasonal allergies    affects sinuses  . Heart murmur    no one has mentioned it lately- had it as a child  . Hypertension   . Limb cramps    leg, thigh, hands  . Pneumonia    "young"  . Shortness of breath dyspnea    With exerion     Past Surgical History:  Procedure Laterality Date  . ABDOMINAL HYSTERECTOMY     complete  . AV FISTULA PLACEMENT Left 01/23/2016   Procedure: LEFT RADIOCEPHALIC  ARTERIOVENOUS (AV) FISTULA CREATION;  Surgeon:  Serafina Mitchell, MD;  Location: [facility];  Service: Vascular;  Laterality: Left;  . AV FISTULA PLACEMENT Left 03/09/2016   Procedure: ARTERIOVENOUS (AV) FISTULA CREATION-LEFT UPPER ARM;  Surgeon: Conrad Clearmont, MD;  Location: [facility];  Service: Vascular;  Laterality: Left;  . BREAST SURGERY Bilateral    4 surgeries, Cyst in each breast  . COLONOSCOPY W/ POLYPECTOMY    . DILATION AND CURETTAGE OF UTERUS    . INSERTION OF DIALYSIS CATHETER N/A 03/09/2016   Procedure: INSERTION OF DIALYSIS CATHETER;  Surgeon: Conrad Rowlesburg, MD;  Location: [facility] OR;  Service: Vascular;  Laterality: N/A;    Family History  Problem Relation Age of Onset  . Cancer - Lung Mother   . Renal Disease Father   . Heart attack Father     Social History   Tobacco Use  . Smoking status: Never Smoker  . Smokeless tobacco: Never Used  Substance Use Topics  . Alcohol use: No  . Drug use: No    ROS   Objective:   Vitals: BP (!) 167/65 (BP Location: Right Arm)   Pulse 68   Temp 97.6 F (36.4 C) (Oral)   Resp 17   LMP  (LMP Unknown)   SpO2 99%   Physical Exam Constitutional:      General: She is not in acute distress.  Appearance: Normal appearance. She is well-developed. She is not ill-appearing, toxic-appearing or diaphoretic.  HENT:     Head: Normocephalic and atraumatic.     Nose: Nose normal.     Mouth/Throat:     Mouth: Mucous membranes are moist.  Eyes:     Extraocular Movements: Extraocular movements intact.     Pupils: Pupils are equal, round, and reactive to light.  Cardiovascular:     Rate and Rhythm: Normal rate and regular rhythm.     Pulses: Normal pulses.     Heart sounds: Normal heart sounds. No murmur heard. No friction rub. No gallop.   Pulmonary:     Effort: Pulmonary effort is normal. No respiratory distress.     Breath sounds: No stridor. No wheezing, rhonchi or rales.     Comments: Slightly decreased breath sounds. Skin:    General: Skin is warm and dry.     Findings: No rash.   Neurological:     Mental Status: She is alert and oriented to person, place, and time.  Psychiatric:        Mood and Affect: Mood normal.        Behavior: Behavior normal.        Thought Content: Thought content normal.     DG Chest 2 View  Result Date: 05/06/2020 CLINICAL DATA:  Productive cough, chest congestion EXAM: CHEST - 2 VIEW COMPARISON:  06/20/2018 FINDINGS: The heart size and mediastinal contours are stable. Atherosclerotic calcification of the aortic knob. No focal airspace consolidation, pleural effusion, or pneumothorax. The visualized skeletal structures are unremarkable. IMPRESSION: No active cardiopulmonary disease. Electronically Signed   By: Davina Poke D.O.   On: 05/06/2020 14:12    Assessment and Plan :   PDMP not reviewed this encounter.  1. Cough   2. Chest congestion   3. ESRD on dialysis [facility])     Will manage for viral illness such as viral URI, viral syndrome, viral rhinitis, COVID-19. Counseled patient on nature of COVID-19 including modes of transmission, diagnostic testing, management and supportive care.  Offered scripts for symptomatic relief. COVID 19 testing is pending. Counseled patient on potential for adverse effects with medications prescribed/recommended today, ER and return-to-clinic precautions discussed, patient verbalized understanding.     Jaynee Eagles, PA-C 05/06/20 1419

## 2020-05-07 LAB — SARS CORONAVIRUS 2 (TAT 6-24 HRS): SARS Coronavirus 2: POSITIVE — AB

## 2020-05-08 ENCOUNTER — Other Ambulatory Visit: Payer: Self-pay | Admitting: Nurse Practitioner

## 2020-05-08 DIAGNOSIS — U071 COVID-19: Secondary | ICD-10-CM

## 2020-05-08 NOTE — Progress Notes (Signed)
I connected by phone with [name] on 05/08/2020 at 8:31 AM to discuss the potential use of a new treatment for mild to moderate COVID-19 viral infection in non-hospitalized patients.  This patient is a 81 y.o. female that meets the FDA criteria for Emergency Use Authorization of COVID monoclonal antibody casirivimab/imdevimab, bamlanivimab/etesevimab, or sotrovimab.  Has a (+) direct SARS-CoV-2 viral test result  Has mild or moderate COVID-19   Is NOT hospitalized due to COVID-19  Is within 10 days of symptom onset  Has at least one of the high risk factor(s) for progression to severe COVID-19 and/or hospitalization as defined in EUA.  Specific high risk criteria : Older age (>/= 80 yo), BMI > 25, Chronic Kidney Disease (CKD), Cardiovascular disease or hypertension and Other high risk medical condition per CDC:  SVI & Unvaccinated   Onset 05/07/19. Unvaccinated.    I have spoken and communicated the following to the patient or parent/caregiver regarding COVID monoclonal antibody treatment:  1. FDA has authorized the emergency use for the treatment of mild to moderate COVID-19 in adults and pediatric patients with positive results of direct SARS-CoV-2 viral testing who are 48 years of age and older weighing at least 40 kg, and who are at high risk for progressing to severe COVID-19 and/or hospitalization.  2. The significant known and potential risks and benefits of COVID monoclonal antibody, and the extent to which such potential risks and benefits are unknown.  3. Information on available alternative treatments and the risks and benefits of those alternatives, including clinical trials.  4. Patients treated with COVID monoclonal antibody should continue to self-isolate and use infection control measures (e.g., wear mask, isolate, social distance, avoid sharing personal items, clean and disinfect "high touch" surfaces, and frequent handwashing) according to CDC guidelines.   5. The  patient or parent/caregiver has the option to accept or refuse COVID monoclonal antibody treatment.  After reviewing this information with the patient, the patient has agreed to receive one of the available covid 19 monoclonal antibodies and will be provided an appropriate fact sheet prior to infusion. Verlon Au, NP 05/08/2020 8:31 AM

## 2020-05-09 ENCOUNTER — Ambulatory Visit (HOSPITAL_COMMUNITY)
Admission: RE | Admit: 2020-05-09 | Discharge: 2020-05-09 | Disposition: A | Discharge status: Home / Self Care | Payer: Medicare PPO | Source: Ambulatory Visit | Attending: Pulmonary Disease | Admitting: Pulmonary Disease

## 2020-05-09 DIAGNOSIS — U071 COVID-19: Secondary | ICD-10-CM | POA: Insufficient documentation

## 2020-05-09 MED ORDER — FAMOTIDINE IN NACL 20-0.9 MG/50ML-% IV SOLN: 20.0000 mg | Freq: Once | INTRAVENOUS | Status: DC | PRN

## 2020-05-09 MED ORDER — METHYLPREDNISOLONE SODIUM SUCC 125 MG IJ SOLR: 125.0000 mg | Freq: Once | INTRAMUSCULAR | Status: DC | PRN

## 2020-05-09 MED ORDER — EPINEPHRINE 0.3 MG/0.3ML IJ SOAJ: 0.3000 mg | Freq: Once | INTRAMUSCULAR | Status: DC | PRN

## 2020-05-09 MED ORDER — SODIUM CHLORIDE 0.9 % IV SOLN: INTRAVENOUS | Status: DC | PRN

## 2020-05-09 MED ORDER — SOTROVIMAB 500 MG/8ML IV SOLN
500.0000 mg | Freq: Once | INTRAVENOUS | Status: AC
  Administered 2020-05-09: 500 mg via INTRAVENOUS

## 2020-05-09 MED ORDER — DIPHENHYDRAMINE HCL 50 MG/ML IJ SOLN: 50.0000 mg | Freq: Once | INTRAMUSCULAR | Status: DC | PRN

## 2020-05-09 MED ORDER — ALBUTEROL SULFATE HFA 108 (90 BASE) MCG/ACT IN AERS: 2.0000 | INHALATION_SPRAY | Freq: Once | RESPIRATORY_TRACT | Status: DC | PRN

## 2020-05-09 NOTE — Progress Notes (Signed)
Patient reviewed Fact Sheet for Patients, Parents, and Caregivers for Emergency Use Authorization (EUA) of sotrovimab for the Treatment of Coronavirus. Patient also reviewed and is agreeable to the estimated cost of treatment. Patient is agreeable to proceed.   

## 2020-05-09 NOTE — Discharge Instructions (Signed)
10 Things You Can Do to Manage Your COVID-19 Symptoms at Home If you have possible or confirmed COVID-19: 1. Stay home from work and school. And stay away from other public places. If you must go out, avoid using any kind of public transportation, ridesharing, or taxis. 2. Monitor your symptoms carefully. If your symptoms get worse, call your healthcare provider immediately. 3. Get rest and stay hydrated. 4. If you have a medical appointment, call the healthcare provider ahead of time and tell them that you have or may have COVID-19. 5. For medical emergencies, call 911 and notify the dispatch personnel that you have or may have COVID-19. 6. Cover your cough and sneezes with a tissue or use the inside of your elbow. 7. Wash your hands often with soap and water for at least 20 seconds or clean your hands with an alcohol-based hand sanitizer that contains at least 60% alcohol. 8. As much as possible, stay in a specific room and away from other people in your home. Also, you should use a separate bathroom, if available. If you need to be around other people in or outside of the home, wear a mask. 9. Avoid sharing personal items with other people in your household, like dishes, towels, and bedding. 10. Clean all surfaces that are touched often, like counters, tabletops, and doorknobs. Use household cleaning sprays or wipes according to the label instructions. cdc.gov/coronavirus 11/01/2018 This information is not intended to replace advice given to you by your health care provider. Make sure you discuss any questions you have with your health care provider. Document Revised: 04/05/2019 Document Reviewed: 04/05/2019 Elsevier Patient Education  2020 Elsevier Inc. What types of side effects do monoclonal antibody drugs cause?  Common side effects  In general, the more common side effects caused by monoclonal antibody drugs include: . Allergic reactions, such as hives or itching . Flu-like signs and  symptoms, including chills, fatigue, fever, and muscle aches and pains . Nausea, vomiting . Diarrhea . Skin rashes . Low blood pressure   The CDC is recommending patients who receive monoclonal antibody treatments wait at least 90 days before being vaccinated.  Currently, there are no data on the safety and efficacy of mRNA COVID-19 vaccines in persons who received monoclonal antibodies or convalescent plasma as part of COVID-19 treatment. Based on the estimated half-life of such therapies as well as evidence suggesting that reinfection is uncommon in the 90 days after initial infection, vaccination should be deferred for at least 90 days, as a precautionary measure until additional information becomes available, to avoid interference of the antibody treatment with vaccine-induced immune responses. If you have any questions or concerns after the infusion please call the Advanced Practice Provider on call at [phone]. This number is ONLY intended for your use regarding questions or concerns about the infusion post-treatment side-effects.  Please do not provide this number to others for use. For return to work notes please contact your primary care provider.   If someone you know is interested in receiving treatment please have them call the COVID hotline at [phone].   

## 2020-05-09 NOTE — Progress Notes (Signed)
Diagnosis: COVID-19  Physician: Dr. Patrick Wright  Procedure: Covid Infusion Clinic Med: Sotrovimab infusion - Provided patient with sotrovimab fact sheet for patients, parents, and caregivers prior to infusion.   Complications: No immediate complications noted  Discharge: Discharged home    

## 2021-02-24 ENCOUNTER — Encounter (HOSPITAL_COMMUNITY): Payer: Self-pay | Admitting: Emergency Medicine

## 2021-02-24 ENCOUNTER — Ambulatory Visit (HOSPITAL_COMMUNITY)
Admission: EM | Admit: 2021-02-24 | Discharge: 2021-02-24 | Disposition: A | Discharge status: Home / Self Care | Payer: Medicare PPO

## 2021-02-24 ENCOUNTER — Emergency Department (HOSPITAL_COMMUNITY)
Admission: EM | Admit: 2021-02-24 | Discharge: 2021-02-25 | Disposition: A | Discharge status: Home / Self Care | Payer: Medicare PPO | Attending: Emergency Medicine | Admitting: Emergency Medicine

## 2021-02-24 ENCOUNTER — Encounter (HOSPITAL_COMMUNITY): Payer: Self-pay

## 2021-02-24 ENCOUNTER — Emergency Department (HOSPITAL_COMMUNITY): Payer: Medicare PPO

## 2021-02-24 ENCOUNTER — Other Ambulatory Visit: Payer: Self-pay

## 2021-02-24 DIAGNOSIS — R0602 Shortness of breath: Secondary | ICD-10-CM

## 2021-02-24 DIAGNOSIS — E8770 Fluid overload, unspecified: Secondary | ICD-10-CM | POA: Diagnosis not present

## 2021-02-24 DIAGNOSIS — R509 Fever, unspecified: Secondary | ICD-10-CM | POA: Diagnosis not present

## 2021-02-24 DIAGNOSIS — R0902 Hypoxemia: Secondary | ICD-10-CM | POA: Diagnosis not present

## 2021-02-24 DIAGNOSIS — R059 Cough, unspecified: Secondary | ICD-10-CM | POA: Diagnosis not present

## 2021-02-24 DIAGNOSIS — Z79899 Other long term (current) drug therapy: Secondary | ICD-10-CM | POA: Diagnosis not present

## 2021-02-24 DIAGNOSIS — J810 Acute pulmonary edema: Secondary | ICD-10-CM

## 2021-02-24 DIAGNOSIS — N186 End stage renal disease: Secondary | ICD-10-CM | POA: Diagnosis not present

## 2021-02-24 DIAGNOSIS — Z992 Dependence on renal dialysis: Secondary | ICD-10-CM | POA: Diagnosis not present

## 2021-02-24 DIAGNOSIS — N189 Chronic kidney disease, unspecified: Secondary | ICD-10-CM

## 2021-02-24 DIAGNOSIS — R778 Other specified abnormalities of plasma proteins: Secondary | ICD-10-CM

## 2021-02-24 DIAGNOSIS — I120 Hypertensive chronic kidney disease with stage 5 chronic kidney disease or end stage renal disease: Secondary | ICD-10-CM | POA: Diagnosis not present

## 2021-02-24 DIAGNOSIS — R768 Other specified abnormal immunological findings in serum: Secondary | ICD-10-CM

## 2021-02-24 DIAGNOSIS — Z8542 Personal history of malignant neoplasm of other parts of uterus: Secondary | ICD-10-CM | POA: Diagnosis not present

## 2021-02-24 DIAGNOSIS — Z20822 Contact with and (suspected) exposure to covid-19: Secondary | ICD-10-CM | POA: Insufficient documentation

## 2021-02-24 LAB — COMPREHENSIVE METABOLIC PANEL
ALT: 42 U/L (ref 0–44)
AST: 32 U/L (ref 15–41)
Albumin: 3.9 g/dL (ref 3.5–5.0)
Alkaline Phosphatase: 65 U/L (ref 38–126)
Anion gap: 17 — ABNORMAL HIGH (ref 5–15)
BUN: 58 mg/dL — ABNORMAL HIGH (ref 8–23)
CO2: 23 mmol/L (ref 22–32)
Calcium: 9.4 mg/dL (ref 8.9–10.3)
Chloride: 100 mmol/L (ref 98–111)
Creatinine, Ser: 12.65 mg/dL — ABNORMAL HIGH (ref 0.44–1.00)
GFR, Estimated: 3 mL/min — ABNORMAL LOW (ref >60)
Glucose, Bld: 93 mg/dL (ref 70–99)
Potassium: 4.9 mmol/L (ref 3.5–5.1)
Sodium: 140 mmol/L (ref 135–145)
Total Bilirubin: 1.2 mg/dL (ref 0.3–1.2)
Total Protein: 7 g/dL (ref 6.5–8.1)

## 2021-02-24 LAB — CBC WITH DIFFERENTIAL/PLATELET
Abs Immature Granulocytes: 0.02 10*3/uL (ref 0.00–0.07)
Basophils Absolute: 0 10*3/uL (ref 0.0–0.1)
Basophils Relative: 0 %
Eosinophils Absolute: 0 10*3/uL (ref 0.0–0.5)
Eosinophils Relative: 0 %
HCT: 31.6 % — ABNORMAL LOW (ref 36.0–46.0)
Hemoglobin: 10.4 g/dL — ABNORMAL LOW (ref 12.0–15.0)
Immature Granulocytes: 0 %
Lymphocytes Relative: 17 %
Lymphs Abs: 0.9 10*3/uL (ref 0.7–4.0)
MCH: 34.8 pg — ABNORMAL HIGH (ref 26.0–34.0)
MCHC: 32.9 g/dL (ref 30.0–36.0)
MCV: 105.7 fL — ABNORMAL HIGH (ref 80.0–100.0)
Monocytes Absolute: 0.7 10*3/uL (ref 0.1–1.0)
Monocytes Relative: 13 %
Neutro Abs: 3.8 10*3/uL (ref 1.7–7.7)
Neutrophils Relative %: 70 %
Platelets: 170 10*3/uL (ref 150–400)
RBC: 2.99 MIL/uL — ABNORMAL LOW (ref 3.87–5.11)
RDW: 13.9 % (ref 11.5–15.5)
WBC: 5.4 10*3/uL (ref 4.0–10.5)
nRBC: 0 % (ref 0.0–0.2)

## 2021-02-24 LAB — LIPASE, BLOOD: Lipase: 48 U/L (ref 11–51)

## 2021-02-24 LAB — RESP PANEL BY RT-PCR (FLU A&B, COVID) ARPGX2
Influenza A by PCR: NEGATIVE
Influenza B by PCR: NEGATIVE
SARS Coronavirus 2 by RT PCR: NEGATIVE

## 2021-02-24 LAB — TROPONIN I (HIGH SENSITIVITY)
Troponin I (High Sensitivity): 77 ng/L — ABNORMAL HIGH (ref <18)
Troponin I (High Sensitivity): 94 ng/L — ABNORMAL HIGH (ref <18)

## 2021-02-24 LAB — HEPATITIS B SURFACE ANTIGEN: Hepatitis B Surface Ag: NONREACTIVE

## 2021-02-24 LAB — HEPATITIS B CORE ANTIBODY, TOTAL: Hep B Core Total Ab: NONREACTIVE

## 2021-02-24 LAB — HEPATITIS B SURFACE ANTIBODY,QUALITATIVE: Hep B S Ab: REACTIVE — AB

## 2021-02-24 LAB — BRAIN NATRIURETIC PEPTIDE: B Natriuretic Peptide: 2344.2 pg/mL — ABNORMAL HIGH (ref 0.0–100.0)

## 2021-02-24 MED ORDER — HEPARIN SODIUM (PORCINE) 1000 UNIT/ML DIALYSIS
1000.0000 [IU] | INTRAMUSCULAR | Status: DC | PRN
  Filled 2021-02-24: qty 1

## 2021-02-24 MED ORDER — CHLORHEXIDINE GLUCONATE CLOTH 2 % EX PADS: 6.0000 | MEDICATED_PAD | Freq: Every day | CUTANEOUS | Status: DC

## 2021-02-24 MED ORDER — LIDOCAINE HCL (PF) 1 % IJ SOLN: 5.0000 mL | INTRAMUSCULAR | Status: DC | PRN

## 2021-02-24 MED ORDER — PENTAFLUOROPROP-TETRAFLUOROETH EX AERO
1.0000 "application " | INHALATION_SPRAY | CUTANEOUS | Status: DC | PRN
  Filled 2021-02-24: qty 116

## 2021-02-24 MED ORDER — SODIUM CHLORIDE 0.9 % IV SOLN: 100.0000 mL | INTRAVENOUS | Status: DC | PRN

## 2021-02-24 MED ORDER — ALTEPLASE 2 MG IJ SOLR: 2.0000 mg | Freq: Once | INTRAMUSCULAR | Status: DC | PRN

## 2021-02-24 MED ORDER — LIDOCAINE-PRILOCAINE 2.5-2.5 % EX CREA
1.0000 "application " | TOPICAL_CREAM | CUTANEOUS | Status: DC | PRN
  Filled 2021-02-24: qty 5

## 2021-02-24 NOTE — ED Notes (Signed)
IV attempted x2 unsuccessful IV team consulted

## 2021-02-24 NOTE — ED Provider Notes (Signed)
[facility] EMERGENCY DEPARTMENT Provider Note   CSN: [id number] Arrival date & time: 02/24/21  1151     History Chief Complaint  Patient presents with   Shortness of Breath    [name] is a 80 y.o. female.  HPI Patient reports that she is having shortness of breath that is worse with exertion.  She reports at rest she is feeling all right at this time.  She had diarrhea on Monday causing her to miss her dialysis session.  Patient reports that if she misses a session she usually can make it to the next session if she is careful with her diet and does not typically have too many problems with severe shortness of breath or fluid overload.  Patient presented to urgent care this morning stating that she had subjective fever and cough yesterday.  She denies generalized body aches.  Patient denies swelling of the legs.  At urgent care she was noted to have oxygen saturation at 88 and referred to the emergency department for further evaluation.    Past Medical History:  Diagnosis Date   Anemia    Arthritis    Cancer ([place])    urterine- surgical removal only tratment   Chronic kidney disease    end stage 4   Environmental and seasonal allergies    affects sinuses   Heart murmur    no one has mentioned it lately- had it as a child   Hypertension    Limb cramps    leg, thigh, hands   Pneumonia    "young"   Shortness of breath dyspnea    With exerion    Patient Active Problem List   Diagnosis Date Noted   Hyperkalemia 06/21/2018   Volume overload 06/21/2018   High anion gap metabolic acidosis 09/73/5329   Essential hypertension 06/21/2018   ESRD (end stage renal disease) ([place]) 06/20/2018   Anemia of chronic disease 11/27/2015   AKI (acute kidney injury) ([place]) 09/13/2014   Nephritic syndrome 09/12/2014    Past Surgical History:  Procedure Laterality Date   ABDOMINAL HYSTERECTOMY     complete   AV FISTULA PLACEMENT Left 01/23/2016   Procedure: LEFT  RADIOCEPHALIC  ARTERIOVENOUS (AV) FISTULA CREATION;  Surgeon: Serafina Mitchell, MD;  Location: [facility];  Service: Vascular;  Laterality: Left;   AV FISTULA PLACEMENT Left 03/09/2016   Procedure: ARTERIOVENOUS (AV) FISTULA CREATION-LEFT UPPER ARM;  Surgeon: Conrad Doerun, MD;  Location: [facility];  Service: Vascular;  Laterality: Left;   BREAST SURGERY Bilateral    4 surgeries, Cyst in each breast   COLONOSCOPY W/ POLYPECTOMY     DILATION AND CURETTAGE OF UTERUS     INSERTION OF DIALYSIS CATHETER N/A 03/09/2016   Procedure: INSERTION OF DIALYSIS CATHETER;  Surgeon: Conrad [place] Haven, MD;  Location: [facility];  Service: Vascular;  Laterality: N/A;     OB History   No obstetric history on file.     Family History  Problem Relation Age of Onset   Cancer - Lung Mother    Renal Disease Father    Heart attack Father     Social History   Tobacco Use   Smoking status: Never   Smokeless tobacco: Never  Substance Use Topics   Alcohol use: No   Drug use: No    Home Medications Prior to Admission medications   Medication Sig Start Date End Date Taking? Authorizing Provider  B Complex-C-Folic Acid (DIALYVITE TABLET) TABS Take 1 tablet by mouth daily.  03/13/18   [provider]  calcitRIOL (ROCALTROL) 0.5 MCG capsule Take 1 capsule by mouth 2 (two) times daily. 11/26/15   [provider]  calcium acetate (PHOSLO) 667 MG capsule Take 1,334-2,001 mg by mouth See admin instructions. 2001mg  three times daily with meals and 1334mg  with two snacks daily 01/12/16   [provider]  cetirizine (ZYRTEC) 5 MG tablet Take 1 tablet 3 times weekly at least every other day. 05/06/20   Jaynee Eagles, PA-C  lidocaine-prilocaine (EMLA) cream Apply 1 application topically daily as needed for pain. 05/23/18   [provider]  nebivolol (BYSTOLIC) 10 MG tablet Take 10 mg by mouth at bedtime.     [provider]    Allergies    Patient has no known allergies.  Review of Systems    Review of Systems 10 systems reviewed and negative except as per HPI Physical Exam Updated Vital Signs BP (!) 170/76   Pulse 73   Temp 99.9 F (37.7 C) (Oral)   Resp (!) 25   LMP  (LMP Unknown)   SpO2 92%   Physical Exam Constitutional:      Comments: Alert nontoxic.  Clear mental status.  No respiratory distress at rest.  HENT:     Mouth/Throat:     Pharynx: Oropharynx is clear.  Eyes:     Extraocular Movements: Extraocular movements intact.  Cardiovascular:     Rate and Rhythm: Normal rate and regular rhythm.  Pulmonary:     Comments: No respiratory distress.  Crackles present at lung bases. Abdominal:     General: There is no distension.     Palpations: Abdomen is soft.     Tenderness: There is no abdominal tenderness. There is no guarding.  Musculoskeletal:        General: No swelling or tenderness. Normal range of motion.     Right lower leg: No edema.     Left lower leg: No edema.  Skin:    General: Skin is warm and dry.  Neurological:     General: No focal deficit present.     Mental Status: She is oriented to person, place, and time.     Motor: No weakness.     Coordination: Coordination normal.  Psychiatric:        Mood and Affect: Mood normal.    ED Results / Procedures / Treatments   Labs (all labs ordered are listed, but only abnormal results are displayed) Labs Reviewed  CBC WITH DIFFERENTIAL/PLATELET - Abnormal; Notable for the following components:      Result Value   RBC 2.99 (*)    Hemoglobin 10.4 (*)    HCT 31.6 (*)    MCV 105.7 (*)    MCH 34.8 (*)    All other components within normal limits  COMPREHENSIVE METABOLIC PANEL - Abnormal; Notable for the following components:   BUN 58 (*)    Creatinine, Ser 12.65 (*)    GFR, Estimated 3 (*)    Anion gap 17 (*)    All other components within normal limits  BRAIN NATRIURETIC PEPTIDE - Abnormal; Notable for the following components:   B Natriuretic Peptide 2,344.2 (*)    All other components  within normal limits  TROPONIN I (HIGH SENSITIVITY) - Abnormal; Notable for the following components:   Troponin I (High Sensitivity) 77 (*)    All other components within normal limits  TROPONIN I (HIGH SENSITIVITY) - Abnormal; Notable for the following components:   Troponin I (High  Sensitivity) 94 (*)    All other components within normal limits  LIPASE, BLOOD  URINALYSIS, ROUTINE W REFLEX MICROSCOPIC    EKG EKG Interpretation  Date/Time:  Tuesday February 24 2021 12:06:13 EDT Ventricular Rate:  69 PR Interval:  114 QRS Duration: 72 QT Interval:  408 QTC Calculation: 437 R Axis:   3 Text Interpretation: Normal sinus rhythm Cannot rule out Anterior infarct , age undetermined Abnormal ECG no sig change from previous Confirmed by Charlesetta Shanks [phone]) on 02/24/2021 5:40:13 PM  Radiology DG Chest 2 View  Result Date: 02/24/2021 CLINICAL DATA:  Shortness of breath EXAM: CHEST - 2 VIEW COMPARISON:  Chest radiograph dated May 07, 2019 FINDINGS: Heart is enlarged. Atherosclerotic calcifications of the aortic arch. Bilateral perihilar opacities concerning for pulmonary edema or infiltrate. No pleural effusion or pneumothorax. IMPRESSION: Cardiomegaly with pulmonary vascular congestion. Bilateral perihilar opacities concerning for pulmonary edema/infiltrate. No pleural effusion or pneumothorax Electronically Signed   By: Keane Police D.O.   On: 02/24/2021 13:25    Procedures Procedures   Medications Ordered in ED Medications - No data to display  ED Course  I have reviewed the triage vital signs and the nursing notes.  Pertinent labs & imaging results that were available during my care of the patient were reviewed by me and considered in my medical decision making (see chart for details).    MDM Rules/Calculators/A&P                           . Patient presents with a missed dialysis session on Monday.  He was found to be hypoxic to 88% at urgent care this morning.  Rest  currently, patient is denying subjective shortness of breath.  She does have crackles present on exam.  Oxygen saturation on monitor is ranging from 88% to 95% with good waveform.  Chest x-ray shows vascular congestion.  Patient does not have any chest pain.  She is clinically well in appearance.  At this time will ambulate to identify degree of hypoxia with exertion.  Will add COVID screening.  Patient ambulated.  After ambulation she did desaturate to the mid 70s.  Over the course of observation she is objectively somewhat more short of breath.  Oxygen saturations are remaining stable at 95 to 100% on 2 L.  Chest x-ray does show vascular congestion.  At this time will review with nephrology.  Reviewed with Dr. Augustin Coupe.  With patient showing volume overload and desaturation with activity, will plan for dialysis.   Patient does not appear to have other acute medical problems.  At this time findings most consistent with volume overload due to missed dialysis.  She has nontoxic and alert.  No chest pain.  After dialysis I anticipate patient is appropriate for discharge. Final Clinical Impression(s) / ED Diagnoses Final diagnoses:  None    Rx / DC Orders ED Discharge Orders     None        Charlesetta Shanks, MD 02/24/21 [phone]

## 2021-02-24 NOTE — ED Triage Notes (Signed)
Pt sent from UC with low oxygen. Pt missed dialysis yesterday due to not feeling good. Pt endorses SOB and some diarrhea yesterday. Denies sick contacts.

## 2021-02-24 NOTE — ED Notes (Signed)
Phlebotomy notified for Hep B panel

## 2021-02-24 NOTE — ED Provider Notes (Signed)
Emergency Medicine Provider Triage Evaluation Note  [name] , a 80 y.o. female  was evaluated in triage.  Pt complains of shortness of breath since last night.  Reportedly had low oxygen today at urgent care.  Dialysis dependent Monday Wednesday Friday but did not call yesterday as she did not feel well with some episodes of loose stool and subjective fever..  Review of Systems  Positive: Shortness of breath, central chest pain, diarrhea, fatigue, subjective fever Negative: Palpitations  Physical Exam  BP (!) 172/74 (BP Location: Left Arm)   Pulse 70   Temp 99.5 F (37.5 C) (Oral)   Resp 18   LMP  (LMP Unknown)   SpO2 98%  Gen:   Awake, no distress   Resp:  Normal effort  MSK:   Moves extremities without difficulty  Other:  systolic murmur, grade 3.  Decreased breath sound the lung bases bilaterally.  Abdomen soft, nondistended, nontender.  Medical Decision Making  Medically screening exam initiated at 12:12 PM.  Appropriate orders placed.  Nickol Collister was informed that the remainder of the evaluation will be completed by another provider, this initial triage assessment does not replace that evaluation, and the importance of remaining in the ED until their evaluation is complete.  This chart was dictated using voice recognition software, Dragon. Despite the best efforts of this provider to proofread and correct errors, errors may still occur which can change documentation meaning.    Emeline Darling, PA-C 02/24/21 1234    Lennice Sites, DO 02/24/21 1325

## 2021-02-24 NOTE — ED Notes (Addendum)
Assisted pt to bedside commode. Pt's oxygen came off while up and pt desat to 77%.  Oxygen reapplied and sats back to 95% on 2 liters

## 2021-02-24 NOTE — ED Notes (Signed)
Consent signed for hemodialysis

## 2021-02-24 NOTE — ED Triage Notes (Signed)
Pt is present today with SOB and fever. Pt states sx started last night. Pt states that she does dialysis x3 times a week and missed yesterday

## 2021-02-24 NOTE — ED Notes (Signed)
Ambulated pt w/ pulse oximeter on RA. Pt became shob w/ labored respirations and O2 dropped to 78%. Instructed pt to sit on stretcher. Placed pt on 2L O2 via [place] and instructed pt to take deep breaths. Pt O2 sat came up to 98% on 2L. Notified MD

## 2021-02-24 NOTE — ED Provider Notes (Signed)
[facility]    CSN: [id number] Arrival date & time: 02/24/21  0944      History   Chief Complaint Chief Complaint  Patient presents with   Fever   Shortness of Breath    HPI [name] is a 80 y.o. female.   Patient here for evaluation of shortness of breath and fever that started yesterday.  Patient did not take temperature but reports that she felt warm and feverish.  Patient is on dialysis and missed yesterday.  Denies any worsening lower extremity edema.  Denies any chest pain.  Denies any congestion or cough.  Denies any trauma, injury, or other precipitating event.  Denies any specific alleviating or aggravating factors.  Denies any N/V/D, numbness, tingling, weakness, abdominal pain, or headaches.    The history is provided by the patient and a caregiver.  Fever Associated symptoms: no chest pain and no cough   Shortness of Breath Associated symptoms: fever   Associated symptoms: no chest pain and no cough    Past Medical History:  Diagnosis Date   Anemia    Arthritis    Cancer ([place])    urterine- surgical removal only tratment   Chronic kidney disease    end stage 4   Environmental and seasonal allergies    affects sinuses   Heart murmur    no one has mentioned it lately- had it as a child   Hypertension    Limb cramps    leg, thigh, hands   Pneumonia    "young"   Shortness of breath dyspnea    With exerion    Patient Active Problem List   Diagnosis Date Noted   Hyperkalemia 06/21/2018   Volume overload 06/21/2018   High anion gap metabolic acidosis 84/53/6468   Essential hypertension 06/21/2018   ESRD (end stage renal disease) ([place]) 06/20/2018   Anemia of chronic disease 11/27/2015   AKI (acute kidney injury) ([place]) 09/13/2014   Nephritic syndrome 09/12/2014    Past Surgical History:  Procedure Laterality Date   ABDOMINAL HYSTERECTOMY     complete   AV FISTULA PLACEMENT Left 01/23/2016   Procedure: LEFT RADIOCEPHALIC   ARTERIOVENOUS (AV) FISTULA CREATION;  Surgeon: Serafina Mitchell, MD;  Location: [facility];  Service: Vascular;  Laterality: Left;   AV FISTULA PLACEMENT Left 03/09/2016   Procedure: ARTERIOVENOUS (AV) FISTULA CREATION-LEFT UPPER ARM;  Surgeon: Conrad St. Peter, MD;  Location: [facility] OR;  Service: Vascular;  Laterality: Left;   BREAST SURGERY Bilateral    4 surgeries, Cyst in each breast   COLONOSCOPY W/ POLYPECTOMY     DILATION AND CURETTAGE OF UTERUS     INSERTION OF DIALYSIS CATHETER N/A 03/09/2016   Procedure: INSERTION OF DIALYSIS CATHETER;  Surgeon: Conrad Colstrip, MD;  [facility];  Service: Vascular;  Laterality: N/A;    OB History   No obstetric history on file.      Home Medications    Prior to Admission medications   Medication Sig Start Date End Date Taking? Authorizing Provider  B Complex-C-Folic Acid (DIALYVITE TABLET) TABS Take 1 tablet by mouth daily. 03/13/18   [provider]  calcitRIOL (ROCALTROL) 0.5 MCG capsule Take 1 capsule by mouth 2 (two) times daily. 11/26/15   [provider]  calcium acetate (PHOSLO) 667 MG capsule Take 1,334-2,001 mg by mouth See admin instructions. 2001mg  three times daily with meals and 1334mg  with two snacks daily 01/12/16   [provider]  cetirizine (ZYRTEC) 5 MG tablet  Take 1 tablet 3 times weekly at least every other day. 05/06/20   Jaynee Eagles, PA-C  lidocaine-prilocaine (EMLA) cream Apply 1 application topically daily as needed for pain. 05/23/18   [provider]  nebivolol (BYSTOLIC) 10 MG tablet Take 10 mg by mouth at bedtime.     [provider]    Family History Family History  Problem Relation Age of Onset   Cancer - Lung Mother    Renal Disease Father    Heart attack Father     Social History Social History   Tobacco Use   Smoking status: Never   Smokeless tobacco: Never  Substance Use Topics   Alcohol use: No   Drug use: No     Allergies   Patient has no known  allergies.   Review of Systems Review of Systems  Constitutional:  Positive for fever.  Respiratory:  Positive for shortness of breath. Negative for cough and chest tightness.   Cardiovascular:  Negative for chest pain and palpitations.  All other systems reviewed and are negative.   Physical Exam Triage Vital Signs ED Triage Vitals  Enc Vitals Group     BP 02/24/21 1045 (!) 168/75     Pulse Rate 02/24/21 1045 68     Resp 02/24/21 1045 18     Temp 02/24/21 1051 98.8 F (37.1 C)     Temp Source 02/24/21 1051 Oral     SpO2 02/24/21 1047 91 %     Weight --      Height --      Head Circumference --      Peak Flow --      Pain Score 02/24/21 1046 0     Pain Loc --      Pain Edu? --      Excl. in [place]? --    No data found.  Updated Vital Signs BP (!) 168/75   Pulse 68   Temp 98.8 F (37.1 C) (Oral)   Resp 18   LMP  (LMP Unknown)   SpO2 91%   Visual Acuity Right Eye Distance:   Left Eye Distance:   Bilateral Distance:    Right Eye Near:   Left Eye Near:    Bilateral Near:     Physical Exam Vitals and nursing note reviewed.  Constitutional:      General: She is not in acute distress.    Appearance: Normal appearance. She is not ill-appearing, toxic-appearing or diaphoretic.  HENT:     Head: Normocephalic and atraumatic.  Eyes:     Conjunctiva/sclera: Conjunctivae normal.  Neck:     Vascular: No JVD.  Cardiovascular:     Rate and Rhythm: Normal rate.     Pulses: Normal pulses.     Heart sounds: Normal heart sounds.  Pulmonary:     Effort: Pulmonary effort is normal.     Breath sounds: No decreased breath sounds.  Abdominal:     General: Abdomen is flat.  Musculoskeletal:        General: Normal range of motion.     Cervical back: Normal range of motion.     Right lower leg: No tenderness. Edema (reports edema normal for her) present.     Left lower leg: No tenderness. Edema (reports edema normal for her) present.  Skin:    General: Skin is warm and  dry.  Neurological:     General: No focal deficit present.     Mental Status: She is alert and oriented to  person, place, and time.  Psychiatric:        Mood and Affect: Mood normal.     UC Treatments / Results  Labs (all labs ordered are listed, but only abnormal results are displayed) Labs Reviewed - No data to display  EKG   Radiology No results found.  Procedures Procedures (including critical care time)  Medications Ordered in UC Medications - No data to display  Initial Impression / Assessment and Plan / UC Course  I have reviewed the triage vital signs and the nursing notes.  Pertinent labs & imaging results that were available during my care of the patient were reviewed by me and considered in my medical decision making (see chart for details).    Upon reevaluation pulse ox was 88% on room air.  It was recommended the patient go to the emergency room for further evaluation of shortness of breath and decreased oxygen saturation.  Concern for possible fluid overload due to missed dialysis.  Patient and family verbalized understanding.  Declined EMS transport at this time. Final Clinical Impressions(s) / UC Diagnoses   Final diagnoses:  Shortness of breath  Hypoxia   Discharge Instructions   None    ED Prescriptions   None    PDMP not reviewed this encounter.   Pearson Forster, NP 02/24/21 1110

## 2021-02-24 NOTE — ED Notes (Signed)
Patient is being discharged from the Urgent Care and sent to the Emergency Department via POV . Per Caroll Rancher NP, patient is in need of higher level of care due to decreased oxygen level. Patient is aware and verbalizes understanding of plan of care.  Vitals:   02/24/21 1047 02/24/21 1051  BP:    Pulse:    Resp:    Temp:  98.8 F (37.1 C)  SpO2: 91%

## 2021-02-25 LAB — RENAL FUNCTION PANEL
Albumin: 3.3 g/dL — ABNORMAL LOW (ref 3.5–5.0)
Anion gap: 14 (ref 5–15)
BUN: 64 mg/dL — ABNORMAL HIGH (ref 8–23)
CO2: 22 mmol/L (ref 22–32)
Calcium: 9.1 mg/dL (ref 8.9–10.3)
Chloride: 100 mmol/L (ref 98–111)
Creatinine, Ser: 13.5 mg/dL — ABNORMAL HIGH (ref 0.44–1.00)
GFR, Estimated: 3 mL/min — ABNORMAL LOW (ref >60)
Glucose, Bld: 125 mg/dL — ABNORMAL HIGH (ref 70–99)
Phosphorus: 4.9 mg/dL — ABNORMAL HIGH (ref 2.5–4.6)
Potassium: 4.9 mmol/L (ref 3.5–5.1)
Sodium: 136 mmol/L (ref 135–145)

## 2021-02-25 LAB — CBC WITH DIFFERENTIAL/PLATELET
Abs Immature Granulocytes: 0.03 10*3/uL (ref 0.00–0.07)
Basophils Absolute: 0 10*3/uL (ref 0.0–0.1)
Basophils Relative: 0 %
Eosinophils Absolute: 0 10*3/uL (ref 0.0–0.5)
Eosinophils Relative: 0 %
HCT: 29.6 % — ABNORMAL LOW (ref 36.0–46.0)
Hemoglobin: 9.9 g/dL — ABNORMAL LOW (ref 12.0–15.0)
Immature Granulocytes: 1 %
Lymphocytes Relative: 15 %
Lymphs Abs: 0.9 10*3/uL (ref 0.7–4.0)
MCH: 34.7 pg — ABNORMAL HIGH (ref 26.0–34.0)
MCHC: 33.4 g/dL (ref 30.0–36.0)
MCV: 103.9 fL — ABNORMAL HIGH (ref 80.0–100.0)
Monocytes Absolute: 1 10*3/uL (ref 0.1–1.0)
Monocytes Relative: 16 %
Neutro Abs: 4.3 10*3/uL (ref 1.7–7.7)
Neutrophils Relative %: 68 %
Platelets: 161 10*3/uL (ref 150–400)
RBC: 2.85 MIL/uL — ABNORMAL LOW (ref 3.87–5.11)
RDW: 14 % (ref 11.5–15.5)
WBC: 6.3 10*3/uL (ref 4.0–10.5)
nRBC: 0 % (ref 0.0–0.2)

## 2021-02-25 NOTE — ED Provider Notes (Signed)
Patient is returned from dialysis and is feeling much better.  Oxygen saturation is excellent on room air.  She is felt to be safe for discharge, follow-up for her routine dialysis.  Of note, hepatitis B studies had been ordered, she is positive for hepatitis B surface antigen.  This will need to be followed up with her dialysis physicians.   Delora Fuel, MD 56/43/32 0500

## 2021-02-25 NOTE — ED Notes (Signed)
Temp 283.6, Roxanne Mins, MD aware, okay to discharge home

## 2021-02-25 NOTE — ED Notes (Signed)
Pt taken to Dialysis

## 2021-02-25 NOTE — Discharge Instructions (Signed)
Please make sure you do not miss any dialysis sessions.

## 2021-02-25 NOTE — ED Notes (Signed)
Hospital provided taxi voucher for pt home

## 2021-12-24 ENCOUNTER — Other Ambulatory Visit: Payer: Self-pay

## 2021-12-24 ENCOUNTER — Emergency Department (HOSPITAL_COMMUNITY): Payer: Medicare PPO

## 2021-12-24 ENCOUNTER — Emergency Department (HOSPITAL_COMMUNITY)
Admission: EM | Admit: 2021-12-24 | Discharge: 2021-12-24 | Disposition: A | Discharge status: Home / Self Care | Payer: Medicare PPO | Attending: Emergency Medicine | Admitting: Emergency Medicine

## 2021-12-24 DIAGNOSIS — W01198A Fall on same level from slipping, tripping and stumbling with subsequent striking against other object, initial encounter: Secondary | ICD-10-CM | POA: Diagnosis not present

## 2021-12-24 DIAGNOSIS — S0181XA Laceration without foreign body of other part of head, initial encounter: Secondary | ICD-10-CM | POA: Insufficient documentation

## 2021-12-24 DIAGNOSIS — W19XXXA Unspecified fall, initial encounter: Secondary | ICD-10-CM

## 2021-12-24 DIAGNOSIS — S0990XA Unspecified injury of head, initial encounter: Secondary | ICD-10-CM | POA: Diagnosis present

## 2021-12-24 LAB — BASIC METABOLIC PANEL
Anion gap: 16 — ABNORMAL HIGH (ref 5–15)
BUN: 33 mg/dL — ABNORMAL HIGH (ref 8–23)
CO2: 27 mmol/L (ref 22–32)
Calcium: 9.7 mg/dL (ref 8.9–10.3)
Chloride: 96 mmol/L — ABNORMAL LOW (ref 98–111)
Creatinine, Ser: 7.09 mg/dL — ABNORMAL HIGH (ref 0.44–1.00)
GFR, Estimated: 5 mL/min — ABNORMAL LOW (ref >60)
Glucose, Bld: 104 mg/dL — ABNORMAL HIGH (ref 70–99)
Potassium: 3.9 mmol/L (ref 3.5–5.1)
Sodium: 139 mmol/L (ref 135–145)

## 2021-12-24 LAB — CBC
HCT: 36 % (ref 36.0–46.0)
Hemoglobin: 11.9 g/dL — ABNORMAL LOW (ref 12.0–15.0)
MCH: 34.9 pg — ABNORMAL HIGH (ref 26.0–34.0)
MCHC: 33.1 g/dL (ref 30.0–36.0)
MCV: 105.6 fL — ABNORMAL HIGH (ref 80.0–100.0)
Platelets: 130 10*3/uL — ABNORMAL LOW (ref 150–400)
RBC: 3.41 MIL/uL — ABNORMAL LOW (ref 3.87–5.11)
RDW: 13.7 % (ref 11.5–15.5)
WBC: 3.1 10*3/uL — ABNORMAL LOW (ref 4.0–10.5)
nRBC: 0 % (ref 0.0–0.2)

## 2021-12-24 MED ORDER — ACETAMINOPHEN 325 MG PO TABS
325.0000 mg | ORAL_TABLET | Freq: Once | ORAL | Status: AC
  Administered 2021-12-24: 325 mg via ORAL
  Filled 2021-12-24: qty 1

## 2021-12-24 NOTE — ED Notes (Addendum)
PATIENT HAS ON HIGH FALL WRIST BAND ON.AND YELLOW SOCKS .HIGH RISK SIGN ON OUTSIDE THE DOOR

## 2021-12-24 NOTE — ED Provider Notes (Signed)
[facility] EMERGENCY DEPARTMENT Provider Note   CSN: [id number] Arrival date & time: 12/24/21  2542     History  Chief Complaint  Patient presents with   [name] is a 81 y.o. female.  Pt complains of falling this am. Pt reports she slipped and fell after going to the restroom.  Pt cut her forehead  no loc.  Pt also has soreness in her left side for 1 week.  Pt reports she twisted her side.  Pt complains of discomfort with moving .  Pt denies any loc.  No visual changes or hearing changes   The history is provided by the patient.  Fall This is a new problem. The current episode started 3 to 5 hours ago. The problem occurs constantly. The problem has not changed since onset.Associated symptoms include headaches. Nothing aggravates the symptoms. Nothing relieves the symptoms. She has tried nothing for the symptoms.       Home Medications Prior to Admission medications   Medication Sig Start Date End Date Taking? Authorizing Provider  B Complex-C-Folic Acid (DIALYVITE TABLET) TABS Take 1 tablet by mouth daily. 03/13/18   [provider]  calcitRIOL (ROCALTROL) 0.5 MCG capsule Take 1 capsule by mouth 2 (two) times daily. 11/26/15   [provider]  calcium acetate (PHOSLO) 667 MG capsule Take 1,334-2,001 mg by mouth See admin instructions. '2001mg'$  three times daily with meals and '1334mg'$  with two snacks daily 01/12/16   [provider]  cetirizine (ZYRTEC) 5 MG tablet Take 1 tablet 3 times weekly at least every other day. 05/06/20   Jaynee Eagles, PA-C  lidocaine-prilocaine (EMLA) cream Apply 1 application topically daily as needed for pain. 05/23/18   [provider]  nebivolol (BYSTOLIC) 10 MG tablet Take 10 mg by mouth at bedtime.     [provider]      Allergies    Patient has no known allergies.    Review of Systems   Review of Systems  Gastrointestinal:  Negative for nausea.  Neurological:  Positive  for headaches.  All other systems reviewed and are negative.   Physical Exam Updated Vital Signs BP (!) 171/73   Pulse 63   Temp 98.1 F (36.7 C) (Oral)   Resp 17   LMP  (LMP Unknown)   SpO2 100%  Physical Exam Vitals reviewed.  Constitutional:      Appearance: Normal appearance.  HENT:     Head: Normocephalic.     Comments: Cm laceration forehead  no gapping    Nose: Nose normal.  Eyes:     Pupils: Pupils are equal, round, and reactive to light.  Cardiovascular:     Rate and Rhythm: Normal rate.     Pulses: Normal pulses.  Pulmonary:     Effort: Pulmonary effort is normal.  Abdominal:     General: Abdomen is flat.  Musculoskeletal:        General: Normal range of motion.     Cervical back: Normal range of motion.     Comments: Tender left side  pain with movement  Skin:    General: Skin is warm.  Neurological:     General: No focal deficit present.     Mental Status: She is alert.  Psychiatric:        Mood and Affect: Mood normal.    ED Results / Procedures / Treatments   Labs (all labs ordered are listed, but only abnormal results are displayed) Labs  Reviewed  BASIC METABOLIC PANEL - Abnormal; Notable for the following components:      Result Value   Chloride 96 (*)    Glucose, Bld 104 (*)    BUN 33 (*)    Creatinine, Ser 7.09 (*)    GFR, Estimated 5 (*)    Anion gap 16 (*)    All other components within normal limits  CBC - Abnormal; Notable for the following components:   WBC 3.1 (*)    RBC 3.41 (*)    Hemoglobin 11.9 (*)    MCV 105.6 (*)    MCH 34.9 (*)    Platelets 130 (*)    All other components within normal limits    EKG None  Radiology DG Chest 2 View  Result Date: 12/24/2021 CLINICAL DATA:  Pt had fall, fell on left side, states sharp pain in left side of chest, no SHOB, hx of pneumonia, hx of HTN. Non-smoker.fall EXAM: CHEST - 2 VIEW COMPARISON:  02/24/2021 FINDINGS: Normal cardiac silhouette. No pulmonary contusion or pleural fluid.  No pneumothorax. No fracture identified. Chronic bronchitic markings. IMPRESSION: No evidence of thoracic trauma. Electronically Signed   By: Suzy Bouchard M.D.   On: 12/24/2021 07:44   CT Head Wo Contrast  Result Date: 12/24/2021 CLINICAL DATA:  81 year old female with history of head trauma from a fall. EXAM: CT HEAD WITHOUT CONTRAST TECHNIQUE: Contiguous axial images were obtained from the base of the skull through the vertex without intravenous contrast. RADIATION DOSE REDUCTION: This exam was performed according to the departmental dose-optimization program which includes automated exposure control, adjustment of the mA and/or kV according to patient size and/or use of iterative reconstruction technique. COMPARISON:  No priors. FINDINGS: Brain: Very mild cerebral atrophy. Patchy areas of decreased attenuation are noted throughout the deep and periventricular white matter of the cerebral hemispheres bilaterally, compatible with mild chronic microvascular ischemic disease. Physiologic calcifications are noted in the basal ganglia bilaterally. No evidence of acute infarction, hemorrhage, hydrocephalus, extra-axial collection or mass lesion/mass effect. Vascular: No hyperdense vessel or unexpected calcification. Skull: Normal. Negative for fracture or focal lesion. Sinuses/Orbits: No acute finding. Other: None. IMPRESSION: 1. No acute intracranial abnormalities. 2. There is very mild cerebral atrophy and mild chronic microvascular ischemic changes in the cerebral white matter, as above. Electronically Signed   By: Vinnie Langton M.D.   On: 12/24/2021 05:54    Procedures .Marland KitchenLaceration Repair  Date/Time: 12/24/2021 8:30 AM  Performed by: Fransico Meadow, PA-C Authorized by: Fransico Meadow, PA-C   Consent:    Consent obtained:  Verbal   Consent given by:  Patient   Risks, benefits, and alternatives were discussed: yes     Risks discussed:  Infection Universal protocol:    Procedure explained and  questions answered to patient or proxy's satisfaction: yes     Immediately prior to procedure, a time out was called: yes     Patient identity confirmed:  Verbally with patient Anesthesia:    Anesthesia method:  None Laceration details:    Location:  Face   Face location:  Forehead   Length (cm):  2 Pre-procedure details:    Preparation:  Patient was prepped and draped in usual sterile fashion Exploration:    Imaging outcome: foreign body not noted   Treatment:    Area cleansed with:  Chlorhexidine   Irrigation solution:  Sterile saline   Visualized foreign bodies/material removed: no   Skin repair:    Repair method:  Tissue adhesive Approximation:  Approximation:  Loose Repair type:    Repair type:  Simple Post-procedure details:    Dressing:  Non-adherent dressing   Procedure completion:  Tolerated     Medications Ordered in ED Medications  acetaminophen (TYLENOL) tablet 325 mg (325 mg Oral Given 12/24/21 0538)    ED Course/ Medical Decision Making/ A&P                           Medical Decision Making Pt reports she slipped and fell after going to the bathroom.    Amount and/or Complexity of Data Reviewed External Data Reviewed: notes.    Details: Lat hospital notes reviewed, nephrology notes reviewed  Labs: ordered. Decision-making details documented in ED Course.    Details: Labs ordered, reviewed and interpreted and are at pt's basline  Radiology: ordered and independent interpretation performed. Decision-making details documented in ED Course.    Details: Chest xray  no acute abnormality  Ct head  no fractuer   Risk OTC drugs. Risk Details: Pt counseled on dermabond.  Pt advised tylenol for discomfort            Final Clinical Impression(s) / ED Diagnoses Final diagnoses:  Fall, initial encounter  Facial laceration, initial encounter    Rx / DC Orders ED Discharge Orders     None      An After Visit Summary was printed and given to the  patient.    Fransico Meadow, PA-C 12/24/21 1055    Malvin Johns, MD 12/24/21 1446

## 2021-12-24 NOTE — ED Triage Notes (Signed)
Pt reports having fallen this AM after getting up to use the rest room. Pt sustained 2 inch vertical lac on forehead. Denies thinners. Denies LOC. Pt also endorses side pain since Monday that has remained unresolved.   HX: Dialysis

## 2021-12-24 NOTE — ED Notes (Signed)
This RN introduced herself to patient. Pt c/o pain 9/10.

## 2021-12-24 NOTE — Discharge Instructions (Addendum)
Tylnoel for pain.  Return if any problems.

## 2022-01-11 IMAGING — CR DG CHEST 2V
2 series · 2 of 2 positions shown · non-contrast
Comparison: Chest radiograph dated May 07, 2019

CLINICAL DATA: Shortness of breath

EXAM:
CHEST - 2 VIEW

[chest pa]
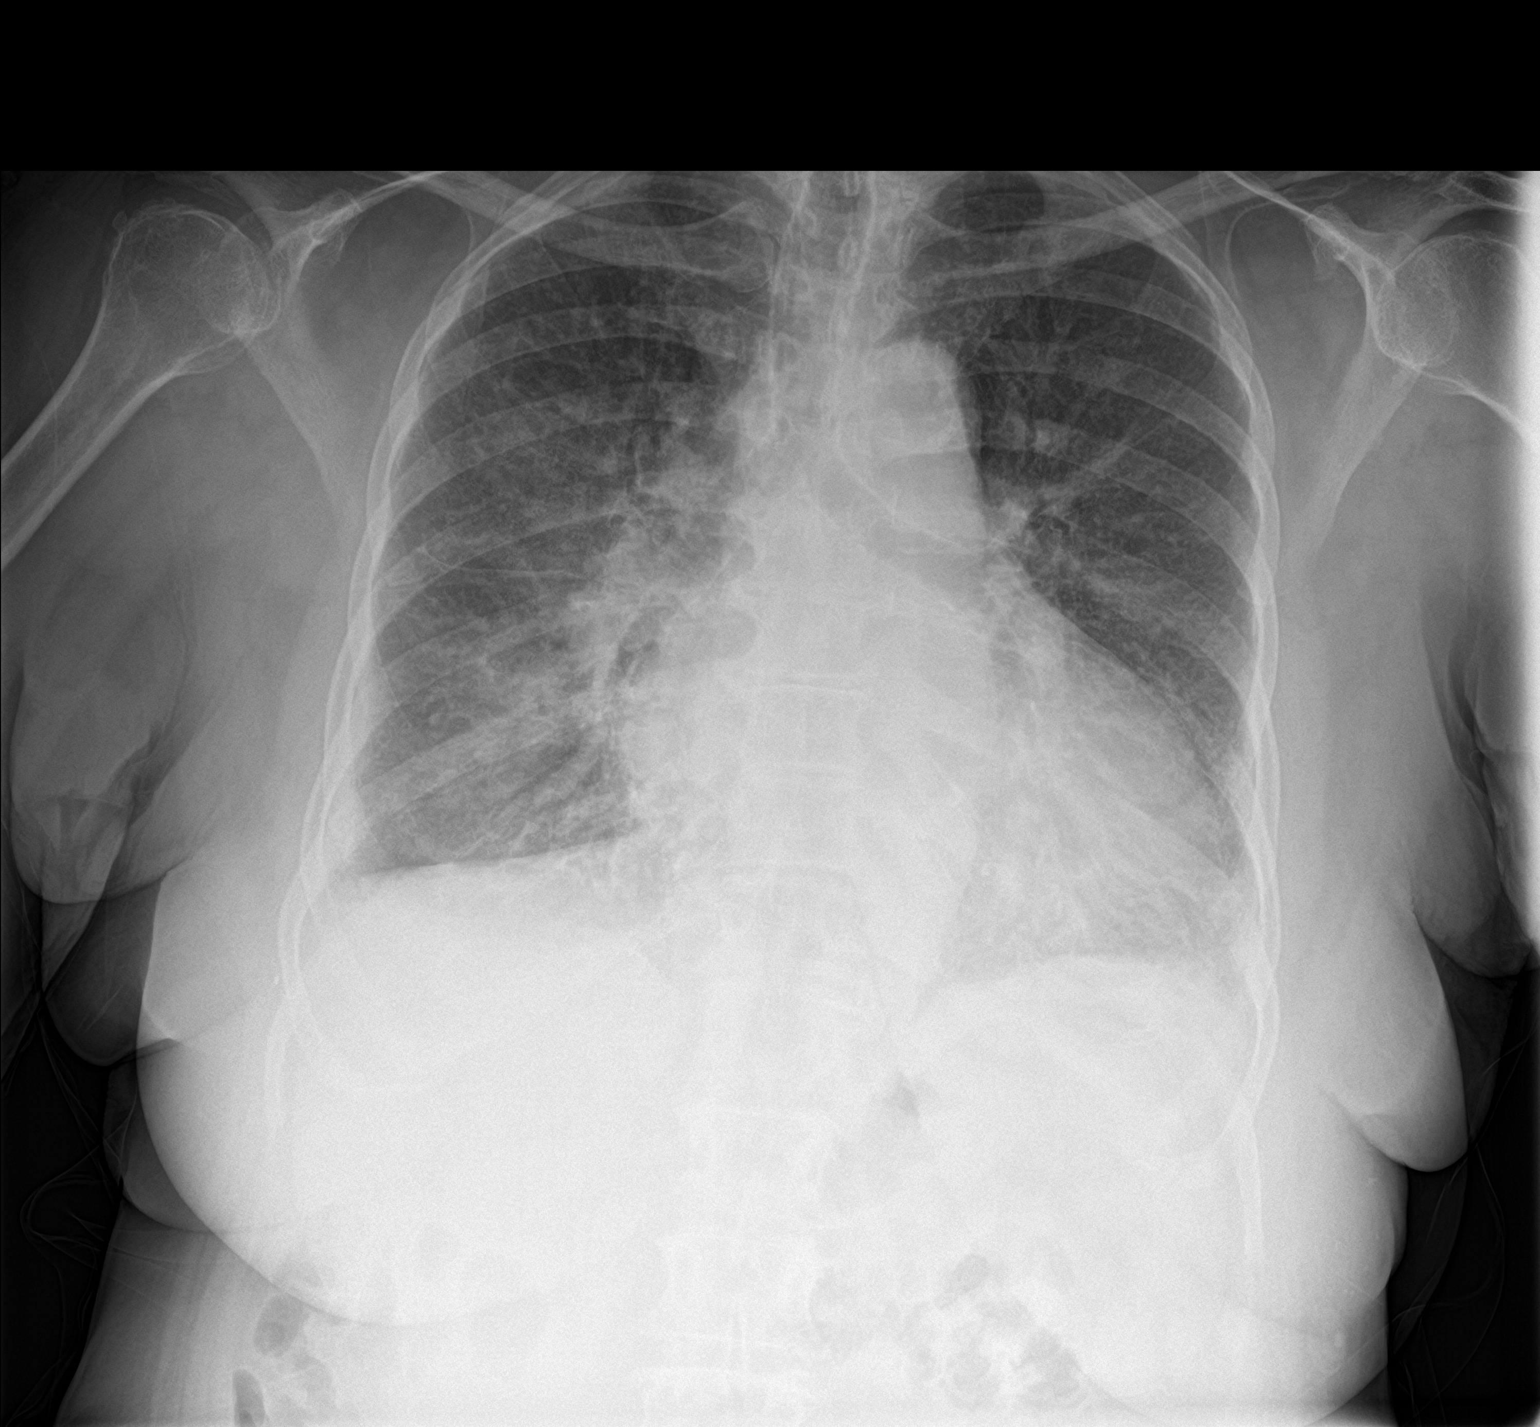

[chest lat]
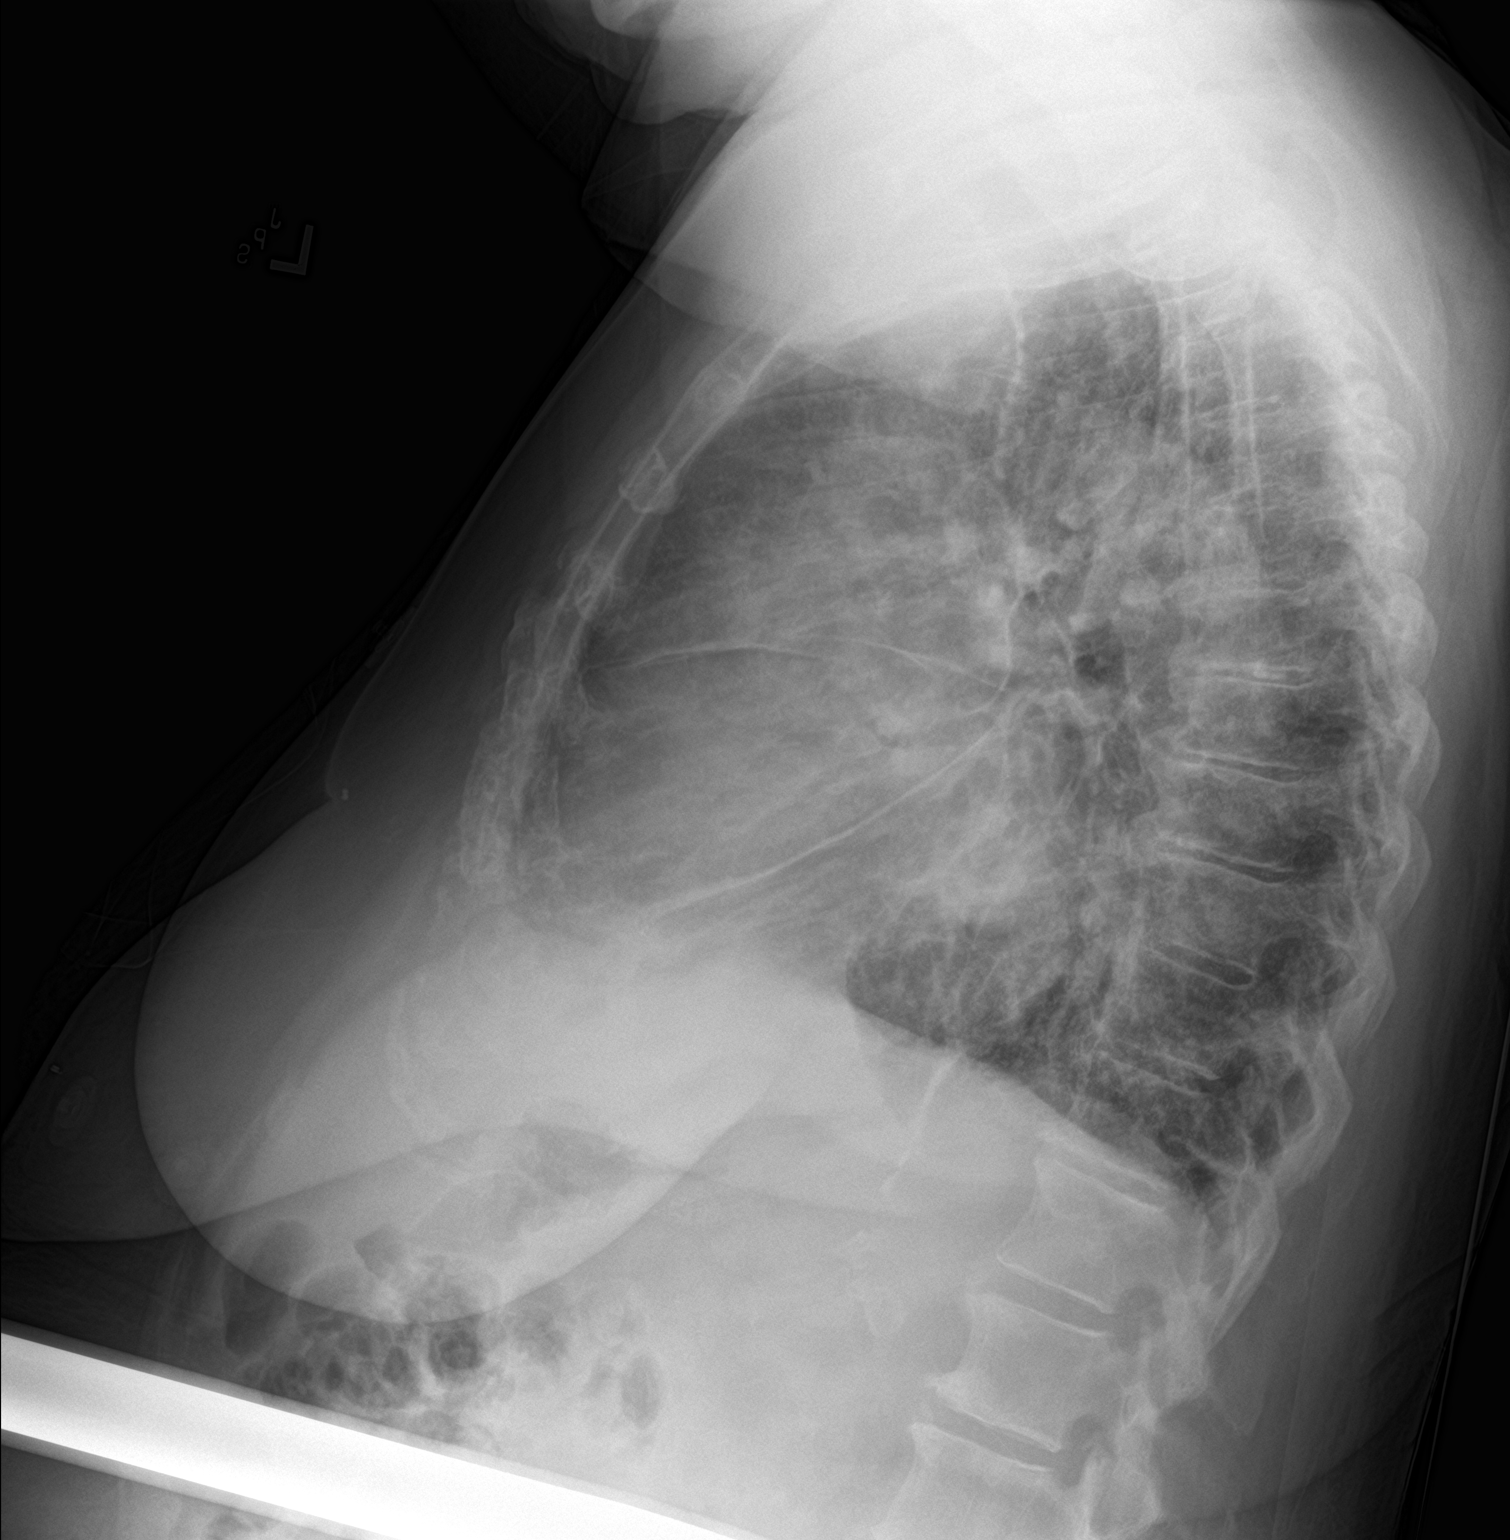

[2 of 2 positions shown; findings below may reference images not displayed]

FINDINGS: Heart is enlarged. Atherosclerotic calcifications of the aortic
arch. Bilateral perihilar opacities concerning for pulmonary edema
or infiltrate. No pleural effusion or pneumothorax.
IMPRESSION: Cardiomegaly with pulmonary vascular congestion. Bilateral perihilar
opacities concerning for pulmonary edema/infiltrate. No pleural
effusion or pneumothorax

## 2022-05-05 ENCOUNTER — Other Ambulatory Visit: Payer: Self-pay

## 2022-05-05 ENCOUNTER — Emergency Department (HOSPITAL_COMMUNITY)
Admission: EM | Admit: 2022-05-05 | Discharge: 2022-05-06 | Disposition: A | Discharge status: Home / Self Care | Payer: Medicare PPO | Attending: Emergency Medicine | Admitting: Emergency Medicine

## 2022-05-05 ENCOUNTER — Encounter (HOSPITAL_COMMUNITY): Payer: Self-pay | Admitting: Emergency Medicine

## 2022-05-05 ENCOUNTER — Emergency Department (HOSPITAL_COMMUNITY): Payer: Medicare PPO

## 2022-05-05 DIAGNOSIS — Z20822 Contact with and (suspected) exposure to covid-19: Secondary | ICD-10-CM | POA: Diagnosis not present

## 2022-05-05 DIAGNOSIS — E875 Hyperkalemia: Secondary | ICD-10-CM | POA: Diagnosis not present

## 2022-05-05 DIAGNOSIS — R5381 Other malaise: Secondary | ICD-10-CM | POA: Diagnosis present

## 2022-05-05 LAB — CBC WITH DIFFERENTIAL/PLATELET
Abs Immature Granulocytes: 0.03 10*3/uL (ref 0.00–0.07)
Basophils Absolute: 0 10*3/uL (ref 0.0–0.1)
Basophils Relative: 1 %
Eosinophils Absolute: 0 10*3/uL (ref 0.0–0.5)
Eosinophils Relative: 0 %
HCT: 28.4 % — ABNORMAL LOW (ref 36.0–46.0)
Hemoglobin: 9.5 g/dL — ABNORMAL LOW (ref 12.0–15.0)
Immature Granulocytes: 1 %
Lymphocytes Relative: 25 %
Lymphs Abs: 1.6 10*3/uL (ref 0.7–4.0)
MCH: 35.4 pg — ABNORMAL HIGH (ref 26.0–34.0)
MCHC: 33.5 g/dL (ref 30.0–36.0)
MCV: 106 fL — ABNORMAL HIGH (ref 80.0–100.0)
Monocytes Absolute: 0.8 10*3/uL (ref 0.1–1.0)
Monocytes Relative: 12 %
Neutro Abs: 4 10*3/uL (ref 1.7–7.7)
Neutrophils Relative %: 61 %
Platelets: 124 10*3/uL — ABNORMAL LOW (ref 150–400)
RBC: 2.68 MIL/uL — ABNORMAL LOW (ref 3.87–5.11)
RDW: 13.2 % (ref 11.5–15.5)
WBC: 6.4 10*3/uL (ref 4.0–10.5)
nRBC: 0 % (ref 0.0–0.2)

## 2022-05-05 LAB — COMPREHENSIVE METABOLIC PANEL
ALT: 32 U/L (ref 0–44)
AST: 20 U/L (ref 15–41)
Albumin: 3.7 g/dL (ref 3.5–5.0)
Alkaline Phosphatase: 53 U/L (ref 38–126)
Anion gap: 17 — ABNORMAL HIGH (ref 5–15)
BUN: 76 mg/dL — ABNORMAL HIGH (ref 8–23)
CO2: 22 mmol/L (ref 22–32)
Calcium: 9.1 mg/dL (ref 8.9–10.3)
Chloride: 99 mmol/L (ref 98–111)
Creatinine, Ser: 14.83 mg/dL — ABNORMAL HIGH (ref 0.44–1.00)
GFR, Estimated: 2 mL/min — ABNORMAL LOW (ref >60)
Glucose, Bld: 100 mg/dL — ABNORMAL HIGH (ref 70–99)
Potassium: 7.1 mmol/L (ref 3.5–5.1)
Sodium: 138 mmol/L (ref 135–145)
Total Bilirubin: 1 mg/dL (ref 0.3–1.2)
Total Protein: 6.7 g/dL (ref 6.5–8.1)

## 2022-05-05 LAB — POTASSIUM: Potassium: 3.6 mmol/L (ref 3.5–5.1)

## 2022-05-05 LAB — RESP PANEL BY RT-PCR (RSV, FLU A&B, COVID)  RVPGX2
Influenza A by PCR: NEGATIVE
Influenza B by PCR: NEGATIVE
Resp Syncytial Virus by PCR: NEGATIVE
SARS Coronavirus 2 by RT PCR: NEGATIVE

## 2022-05-05 LAB — MAGNESIUM: Magnesium: 2.5 mg/dL — ABNORMAL HIGH (ref 1.7–2.4)

## 2022-05-05 LAB — HEPATITIS B SURFACE ANTIGEN: Hepatitis B Surface Ag: NONREACTIVE

## 2022-05-05 MED ORDER — CALCIUM GLUCONATE-NACL 1-0.675 GM/50ML-% IV SOLN: 1.0000 g | Freq: Once | INTRAVENOUS | Status: DC

## 2022-05-05 MED ORDER — PENTAFLUOROPROP-TETRAFLUOROETH EX AERO
INHALATION_SPRAY | CUTANEOUS | Status: AC
  Filled 2022-05-05: qty 30

## 2022-05-05 MED ORDER — DEXTROSE 50 % IV SOLN: 1.0000 | Freq: Once | INTRAVENOUS | Status: DC

## 2022-05-05 MED ORDER — INSULIN ASPART 100 UNIT/ML IV SOLN: 5.0000 [IU] | Freq: Once | INTRAVENOUS | Status: DC

## 2022-05-05 MED ORDER — ALBUTEROL SULFATE (2.5 MG/3ML) 0.083% IN NEBU: 10.0000 mg | INHALATION_SOLUTION | Freq: Once | RESPIRATORY_TRACT | Status: DC

## 2022-05-05 MED ORDER — ACETAMINOPHEN 325 MG PO TABS
650.0000 mg | ORAL_TABLET | Freq: Once | ORAL | Status: AC | PRN
  Administered 2022-05-05: 650 mg via ORAL
  Filled 2022-05-05: qty 2

## 2022-05-05 MED ORDER — CHLORHEXIDINE GLUCONATE CLOTH 2 % EX PADS: 6.0000 | MEDICATED_PAD | Freq: Every day | CUTANEOUS | Status: DC

## 2022-05-05 MED ORDER — HEPARIN SODIUM (PORCINE) 1000 UNIT/ML IJ SOLN
3000.0000 [IU] | Freq: Once | INTRAMUSCULAR | Status: AC
  Administered 2022-05-05: 3000 [IU] via INTRAVENOUS

## 2022-05-05 MED ORDER — HEPARIN SODIUM (PORCINE) 1000 UNIT/ML IJ SOLN
INTRAMUSCULAR | Status: AC
  Filled 2022-05-05: qty 3

## 2022-05-05 NOTE — ED Provider Notes (Signed)
11:59 PM Assumed care from Dr. Oswald Hillock, please see their note for full history, physical and decision making until this point. In brief this is a 82 y.o. year old female who presented to the ED tonight with Generalized Body Aches     82 year old female here with hyperkalemia shortness of breath is at dialysis and needs reevaluation for likely discharge.  At this time patient appears well.  Oxygen saturation ranges from 93 to 96% on room air.  Her lungs are clear.  She feels better.  Potassium was corrected in dialysis.  She has dialysis appointment on Friday.  She request to go home so can return for any new or worsening symptoms  Discharge instructions, including strict return precautions for new or worsening symptoms, given. Patient and/or family verbalized understanding and agreement with the plan as described.   Labs, studies and imaging reviewed by myself and considered in medical decision making if ordered. Imaging interpreted by radiology.  Labs Reviewed  COMPREHENSIVE METABOLIC PANEL - Abnormal; Notable for the following components:      Result Value   Potassium 7.1 (*)    Glucose, Bld 100 (*)    BUN 76 (*)    Creatinine, Ser 14.83 (*)    GFR, Estimated 2 (*)    Anion gap 17 (*)    All other components within normal limits  CBC WITH DIFFERENTIAL/PLATELET - Abnormal; Notable for the following components:   RBC 2.68 (*)    Hemoglobin 9.5 (*)    HCT 28.4 (*)    MCV 106.0 (*)    MCH 35.4 (*)    Platelets 124 (*)    All other components within normal limits  MAGNESIUM - Abnormal; Notable for the following components:   Magnesium 2.5 (*)    All other components within normal limits  RESP PANEL BY RT-PCR (RSV, FLU A&B, COVID)  RVPGX2  HEPATITIS B SURFACE ANTIGEN  POTASSIUM  HEPATITIS B SURFACE ANTIBODY, QUANTITATIVE  POTASSIUM    DG Chest 2 View  Final Result      No follow-ups on file.    Chi Woodham, Corene Cornea, MD 05/06/22 0001

## 2022-05-05 NOTE — ED Notes (Signed)
Patient going to dialysis

## 2022-05-05 NOTE — ED Triage Notes (Signed)
PT reports generalized body aches and cough x 1 week. Denies fevers. Pt missed dialysis today.

## 2022-05-05 NOTE — Plan of Care (Signed)
Renal brief note:   Hyperkalemia after missing HD   Plan for 1K bath for 2 hours then transition to a 2K for remainder of HD  HD now then the ER anticipates discharge home after HD.  Spoke with HD charge RN   Claudia Desanctis, MD 4:20 PM 05/05/2022

## 2022-05-05 NOTE — ED Provider Notes (Signed)
[facility] EMERGENCY DEPARTMENT Provider Note   CSN: [id number] Arrival date & time: 05/05/22  0175     History Chief Complaint  Patient presents with   Generalized Body Aches    HPI [name] is a 82 y.o. female presenting for chief complaint of malaise.  She has had body aches fatigue over the last 5 days.  Missed 3 episodes of dialysis because of her symptoms.  Said poor p.o. intake, nausea vomiting fatigue.  Patient's recorded medical, surgical, social, medication list and allergies were reviewed in the Snapshot window as part of the initial history.   Review of Systems   Review of Systems  Constitutional:  Positive for fatigue. Negative for chills and fever.  HENT:  Negative for ear pain and sore throat.   Eyes:  Negative for pain and visual disturbance.  Respiratory:  Negative for cough and shortness of breath.   Cardiovascular:  Negative for chest pain and palpitations.  Gastrointestinal:  Positive for nausea and vomiting. Negative for abdominal pain.  Genitourinary:  Negative for dysuria and hematuria.  Musculoskeletal:  Negative for arthralgias and back pain.  Skin:  Negative for color change and rash.  Neurological:  Negative for seizures and syncope.  All other systems reviewed and are negative.   Physical Exam Updated Vital Signs BP (!) 160/69 (BP Location: Right Arm)   Pulse 73   Temp 98.3 F (36.8 C) (Oral)   Resp 20   LMP  (LMP Unknown)   SpO2 94%  Physical Exam Vitals and nursing note reviewed.  Constitutional:      General: She is not in acute distress.    Appearance: She is well-developed.  HENT:     Head: Normocephalic and atraumatic.  Eyes:     Conjunctiva/sclera: Conjunctivae normal.  Cardiovascular:     Rate and Rhythm: Normal rate and regular rhythm.     Heart sounds: No murmur heard. Pulmonary:     Effort: Pulmonary effort is normal. No respiratory distress.     Breath sounds: Normal breath sounds.  Abdominal:      General: There is no distension.     Palpations: Abdomen is soft.     Tenderness: There is no abdominal tenderness. There is no right CVA tenderness or left CVA tenderness.  Musculoskeletal:        General: No swelling or tenderness. Normal range of motion.     Cervical back: Neck supple.  Skin:    General: Skin is warm and dry.  Neurological:     General: No focal deficit present.     Mental Status: She is alert and oriented to person, place, and time. Mental status is at baseline.     Cranial Nerves: No cranial nerve deficit.      ED Course/ Medical Decision Making/ A&P Clinical Course as of 05/05/22 2250  Wed May 05, 2022  1514 Patient not at bedside [CC]  1543 Missed HD x3  Has AVF [CC]  1614 HD and DC [CC]    Clinical Course User Index [CC] Tretha Sciara, MD    Procedures .Critical Care  Performed by: Tretha Sciara, MD Authorized by: Tretha Sciara, MD   Critical care provider statement:    Critical care time (minutes):  30   Critical care was time spent personally by me on the following activities:  Development of treatment plan with patient or surrogate, discussions with consultants, evaluation of patient's response to treatment, examination of patient, ordering and review of laboratory studies, ordering  and review of radiographic studies, ordering and performing treatments and interventions, pulse oximetry, re-evaluation of patient's condition and review of old charts    Medications Ordered in ED Medications  calcium gluconate 1 g/ 50 mL sodium chloride IVPB (has no administration in time range)  albuterol (PROVENTIL) (2.5 MG/3ML) 0.083% nebulizer solution 10 mg (has no administration in time range)  insulin aspart (novoLOG) injection 5 Units (has no administration in time range)    And  dextrose 50 % solution 50 mL (has no administration in time range)  Chlorhexidine Gluconate Cloth 2 % PADS 6 each (has no administration in time range)   pentafluoroprop-tetrafluoroeth (GEBAUERS) aerosol (has no administration in time range)  heparin sodium (porcine) 1000 UNIT/ML injection (  Not Given 05/05/22 2108)  acetaminophen (TYLENOL) tablet 650 mg (650 mg Oral Given 05/05/22 1131)  heparin sodium (porcine) injection 3,000 Units (3,000 Units Intravenous Given 05/05/22 2026)    Medical Decision Making:    Gabryela Kimbrell is a 82 y.o. female who presented to the ED today with multiple symptoms detailed above.     Patient's presentation is complicated by their history of multiple comorbid medical problems.  Patient placed on continuous vitals and telemetry monitoring while in ED which was reviewed periodically.   Complete initial physical exam performed, notably the patient  was hemodynamically stable in no acute distress.  No abdominal tenderness.  GCS 15.  Patient ambulatory in the room..      Reviewed and confirmed nursing documentation for past medical history, family history, social history.    Initial Assessment:   Patient's history of present on his physical exam findings are most consistent with a metabolic emergency given her missed dialysis.  Labs resulted with a potassium of 7.8 and evidence of EKG changes.  Temporization measures started in emergency department and nephrology emergently consulted.  They reviewed her chart and actually recommended taking her to dialysis immediately.  She was taken for dialysis.  Burtis Junes that she will be stable Outpatient care and management when she returns from dialysis. Pending reassessment, return from HD session at time of signout to oncoming provider..  Clinical Impression:  1. Hyperkalemia      Data Unavailable   Final Clinical Impression(s) / ED Diagnoses Final diagnoses:  Hyperkalemia    Rx / DC Orders ED Discharge Orders     None         Tretha Sciara, MD 05/05/22 2250

## 2022-05-05 NOTE — Progress Notes (Signed)
Pt. Request 16g needles instead of 15g needles.

## 2022-05-05 NOTE — ED Provider Triage Note (Signed)
Emergency Medicine Provider Triage Evaluation Note  [name] , a 82 y.o. female  was evaluated in triage.  Pt complains of generalized body ache and cough.  Symptoms been present for the past week.  Has ESRD.  Gets dialysis Monday Wednesday Friday.  Has missed her last 2 episodes of dialysis.  Cough is nonproductive.  Denies chest pain, shortness of breath, fevers, chills  Review of Systems  Positive: As above Negative: As above  Physical Exam  BP (!) 177/67 (BP Location: Right Arm)   Pulse 64   Temp 99 F (37.2 C)   Resp 18   LMP  (LMP Unknown)   SpO2 94%  Gen:   Awake, no distress   Resp:  Normal effort  MSK:   Moves extremities without difficulty  Other:    Medical Decision Making  Medically screening exam initiated at 11:12 AM.  Appropriate orders placed.  Alyn Riedinger was informed that the remainder of the evaluation will be completed by another provider, this initial triage assessment does not replace that evaluation, and the importance of remaining in the ED until their evaluation is complete.     Roylene Reason, [place] 05/05/22 1114

## 2022-05-05 NOTE — Discharge Instructions (Signed)
You were seen today for myalgias fatigue.  You were found to have emergently elevated potassium.  You were taken for dialysis and your potassium was corrected.  You are going to need close outpatient follow-up with your nephrologist and dialysis as scheduled.  Please return to your normal schedule on Friday and return if you have any ongoing symptoms.  The rest of your workup was overall reassuring with no other pathology identified.

## 2022-05-05 NOTE — ED Notes (Addendum)
   05/05/22 2228  Vitals  Temp 98.3 F (36.8 C)  Temp Source Oral  BP (!) 160/69  BP Location Right Arm  BP Method Automatic  Patient Position (if appropriate) Lying  Pulse Rate 73  Pulse Rate Source Monitor  ECG Heart Rate 74  Resp 20  Oxygen Therapy  SpO2 94 %  O2 Device Room Air  During Treatment Monitoring  Intra-Hemodialysis Comments Tx completed  Post Treatment  Dialyzer Clearance Lightly streaked  Duration of HD Treatment -hour(s) 4 hour(s)  Liters Processed 64.2  Fluid Removed (mL) 3000 mL (3 liters)  Tolerated HD Treatment Yes  Post-Hemodialysis Comments tx complete, pt stable, goal met, nausea mid tx, with pause of UF for 30 minutes and then resumed without issues.  AVG/AVF Arterial Site Held (minutes) 10 minutes  AVG/AVF Venous Site Held (minutes) 10 minutes   Pt recheck of K+-3.6 mid dialysis.  

## 2022-07-29 ENCOUNTER — Ambulatory Visit (HOSPITAL_COMMUNITY)
Admission: EM | Admit: 2022-07-29 | Discharge: 2022-07-29 | Disposition: A | Discharge status: Home / Self Care | Payer: Medicare PPO | Attending: Emergency Medicine | Admitting: Emergency Medicine

## 2022-07-29 ENCOUNTER — Encounter (HOSPITAL_COMMUNITY): Payer: Self-pay

## 2022-07-29 DIAGNOSIS — H109 Unspecified conjunctivitis: Secondary | ICD-10-CM | POA: Diagnosis not present

## 2022-07-29 DIAGNOSIS — R051 Acute cough: Secondary | ICD-10-CM | POA: Diagnosis not present

## 2022-07-29 MED ORDER — BENZONATATE 100 MG PO CAPS: 100.0000 mg | ORAL_CAPSULE | Freq: Three times a day (TID) | ORAL | 0 refills | Status: DC | PRN

## 2022-07-29 MED ORDER — MOXIFLOXACIN HCL 0.5 % OP SOLN: 1.0000 [drp] | Freq: Three times a day (TID) | OPHTHALMIC | 0 refills | Status: DC

## 2022-07-29 NOTE — Discharge Instructions (Addendum)
Use the eye drops 3x daily for the next 7 days  I recommend to take the cough medicine (pills) every 6 hours if needed to help with cough. Continue your home remedies as well!  If symptoms worsen, please come back and see [place].  The number below is for a primary care office located on [facility]. Give them a call to set up an appointment! Open 8 am - 5 pm every week day.

## 2022-07-29 NOTE — ED Triage Notes (Signed)
Patient c/o a productive cough with green sputum and itchy red eyes x 4 days.  Patient has not taken any medication for her symptoms.

## 2022-07-29 NOTE — ED Provider Notes (Signed)
[name]    CSN: [id number] Arrival date & time: 07/29/22  0915      History   Chief Complaint Chief Complaint  Patient presents with   Cough   eye issue    HPI [name] is a 82 y.o. female.  2 day history of right eye redness, itching Denies pain. Having some drainage No vision changes  Also 4 day history of productive cough. She feels it has improved a lot with her home remedy of honey, lemon, olive oil. No fevers, shortness of breath, chest pain, abdominal pain  She is in dialysis M/W/F  Past Medical History:  Diagnosis Date   Anemia    Arthritis    Cancer ([place])    urterine- surgical removal only tratment   Chronic kidney disease    end stage 4   Environmental and seasonal allergies    affects sinuses   Heart murmur    no one has mentioned it lately- had it as a child   Hypertension    Limb cramps    leg, thigh, hands   Pneumonia    "young"   Shortness of breath dyspnea    With exerion    Patient Active Problem List   Diagnosis Date Noted   Hyperkalemia 06/21/2018   Volume overload 06/21/2018   High anion gap metabolic acidosis [id number]   Essential hypertension 06/21/2018   ESRD (end stage renal disease) ([place]) 06/20/2018   Anemia of chronic disease 11/27/2015   AKI (acute kidney injury) ([place]) 09/13/2014   Nephritic syndrome 09/12/2014    Past Surgical History:  Procedure Laterality Date   ABDOMINAL HYSTERECTOMY     complete   AV FISTULA PLACEMENT Left 01/23/2016   Procedure: LEFT RADIOCEPHALIC  ARTERIOVENOUS (AV) FISTULA CREATION;  Surgeon: Serafina Mitchell, MD;  Location: [facility];  Service: Vascular;  Laterality: Left;   AV FISTULA PLACEMENT Left 03/09/2016   Procedure: ARTERIOVENOUS (AV) FISTULA CREATION-LEFT UPPER ARM;  Surgeon: Conrad Loa, MD;  Location: [place];  Service: Vascular;  Laterality: Left;   BREAST SURGERY Bilateral    4 surgeries, Cyst in each breast   COLONOSCOPY W/ POLYPECTOMY     DILATION AND CURETTAGE OF  UTERUS     INSERTION OF DIALYSIS CATHETER N/A 03/09/2016   Procedure: INSERTION OF DIALYSIS CATHETER;  Surgeon: Conrad Parcelas [place] Bor[place], MD;  Location: [facility];  Service: Vascular;  Laterality: N/A;    OB History   No obstetric history on file.      Home Medications    Prior to Admission medications   Medication Sig Start Date End Date Taking? Authorizing Provider  benzonatate (TESSALON) 100 MG capsule Take 1 capsule (100 mg total) by mouth 3 (three) times daily as needed for cough. 07/29/22  Yes Tylik Treese, Wells Guiles, PA-C  moxifloxacin (VIGAMOX) 0.5 % ophthalmic solution Place 1 drop into both eyes 3 (three) times daily. 07/29/22  Yes Jermone Geister, PA-C  B Complex-C-Folic Acid (DIALYVITE TABLET) TABS Take 1 tablet by mouth daily. 03/13/18   [provider]  calcitRIOL (ROCALTROL) 0.5 MCG capsule Take 1 capsule by mouth 2 (two) times daily. 11/26/15   [provider]  calcium acetate (PHOSLO) 667 MG capsule Take 1,334-2,001 mg by mouth See admin instructions. 2001mg  three times daily with meals and 1334mg  with two snacks daily 01/12/16   [provider]  cetirizine (ZYRTEC) 5 MG tablet Take 1 tablet 3 times weekly at least every other day. 05/06/20   Jaynee Eagles, PA-C  lidocaine-prilocaine (EMLA)  cream Apply 1 application topically daily as needed for pain. 05/23/18   [provider]  nebivolol (BYSTOLIC) 10 MG tablet Take 10 mg by mouth at bedtime.     [provider]    Family History Family History  Problem Relation Age of Onset   Cancer - Lung Mother    Renal Disease Father    Heart attack Father     Social History Social History   Tobacco Use   Smoking status: Never   Smokeless tobacco: Never  Vaping Use   Vaping Use: Never used  Substance Use Topics   Alcohol use: No   Drug use: No     Allergies   Patient has no known allergies.   Review of Systems Review of Systems As per HPI  Physical Exam Triage Vital Signs ED Triage Vitals  Enc  Vitals Group     BP 07/29/22 1050 (!) 159/67     Pulse Rate 07/29/22 1050 67     Resp 07/29/22 1050 16     Temp 07/29/22 1050 97.9 F (36.6 C)     Temp Source 07/29/22 1050 Oral     SpO2 07/29/22 1050 93 %     Weight --      Height --      Head Circumference --      Peak Flow --      Pain Score 07/29/22 1051 0     Pain Loc --      Pain Edu? --      Excl. in [place]? --    No data found.  Updated Vital Signs BP (!) 159/67 (BP Location: Right Arm)   Pulse 67   Temp 97.9 F (36.6 C) (Oral)   Resp 16   LMP  (LMP Unknown)   SpO2 93%    Physical Exam Vitals and nursing note reviewed.  Constitutional:      General: She is not in acute distress. HENT:     Nose: No congestion or rhinorrhea.     Mouth/Throat:     Mouth: Mucous membranes are moist.     Pharynx: Oropharynx is clear.  Eyes:     General:        Right eye: Discharge present.        Left eye: Discharge present.    Extraocular Movements: Extraocular movements intact.     Pupils: Pupils are equal, round, and reactive to light.     Comments: Green discharge in right eye. A little in lashes of left eye. No conjunctival injection   Cardiovascular:     Rate and Rhythm: Normal rate and regular rhythm.     Heart sounds: Murmur heard.     Comments: Systolic murmur  Pulmonary:     Effort: Pulmonary effort is normal.     Breath sounds: Normal breath sounds. No wheezing, rhonchi or rales.  Musculoskeletal:     Cervical back: Normal range of motion.  Skin:    General: Skin is warm and dry.  Neurological:     Mental Status: She is alert and oriented to person, place, and time.     UC Treatments / Results  Labs (all labs ordered are listed, but only abnormal results are displayed) Labs Reviewed - No data to display  EKG  Radiology No results found.  Procedures Procedures (including critical care time)  Medications Ordered in UC Medications - No data to display  Initial Impression / Assessment and Plan / UC  Course  I have reviewed  the triage vital signs and the nursing notes.  Pertinent labs & imaging results that were available during my care of the patient were reviewed by me and considered in my medical decision making (see chart for details).  Bacterial conjunctivitis of right eye May be starting in the left eye. Vigamox TID x 7 days in both eyes  Clear lungs. Afebrile and well appearing. No indication for xray today. Recommend continue her symptomatic care. Can add tessalon TID PRN Return precautions discussed. Patient agrees to plan  Final Clinical Impressions(s) / UC Diagnoses   Final diagnoses:  Bacterial conjunctivitis of right eye  Acute cough     Discharge Instructions      Use the eye drops 3x daily for the next 7 days  I recommend to take the cough medicine (pills) every 6 hours if needed to help with cough. Continue your home remedies as well!  If symptoms worsen, please come back and see [place].  The number below is for a primary care office located on [facility]. Give them a call to set up an appointment! Open 8 am - 5 pm every week day.    ED Prescriptions     Medication Sig Dispense Auth. Provider   benzonatate (TESSALON) 100 MG capsule Take 1 capsule (100 mg total) by mouth 3 (three) times daily as needed for cough. 15 capsule Alexandera Kuntzman, PA-C   moxifloxacin (VIGAMOX) 0.5 % ophthalmic solution Place 1 drop into both eyes 3 (three) times daily. 3 mL Babak Lucus, Wells Guiles, PA-C      PDMP not reviewed this encounter.   [place], [place] 07/29/22 1133

## 2023-03-14 ENCOUNTER — Encounter (HOSPITAL_COMMUNITY): Payer: Self-pay | Admitting: Internal Medicine

## 2023-03-14 ENCOUNTER — Other Ambulatory Visit: Payer: Self-pay

## 2023-03-14 ENCOUNTER — Emergency Department (HOSPITAL_COMMUNITY): Payer: Medicare PPO

## 2023-03-14 ENCOUNTER — Inpatient Hospital Stay (HOSPITAL_COMMUNITY)
Admission: EM | Admit: 2023-03-14 | Discharge: 2023-03-19 | DRG: 640 | Disposition: A | Discharge status: Home Health | Payer: Medicare PPO | Attending: Internal Medicine | Admitting: Internal Medicine

## 2023-03-14 DIAGNOSIS — E66811 Obesity, class 1: Secondary | ICD-10-CM | POA: Diagnosis present

## 2023-03-14 DIAGNOSIS — I10 Essential (primary) hypertension: Secondary | ICD-10-CM | POA: Diagnosis not present

## 2023-03-14 DIAGNOSIS — D638 Anemia in other chronic diseases classified elsewhere: Secondary | ICD-10-CM | POA: Diagnosis not present

## 2023-03-14 DIAGNOSIS — I120 Hypertensive chronic kidney disease with stage 5 chronic kidney disease or end stage renal disease: Secondary | ICD-10-CM | POA: Diagnosis present

## 2023-03-14 DIAGNOSIS — J189 Pneumonia, unspecified organism: Principal | ICD-10-CM

## 2023-03-14 DIAGNOSIS — Z8542 Personal history of malignant neoplasm of other parts of uterus: Secondary | ICD-10-CM

## 2023-03-14 DIAGNOSIS — M1990 Unspecified osteoarthritis, unspecified site: Secondary | ICD-10-CM | POA: Diagnosis present

## 2023-03-14 DIAGNOSIS — I959 Hypotension, unspecified: Secondary | ICD-10-CM | POA: Diagnosis not present

## 2023-03-14 DIAGNOSIS — Z8249 Family history of ischemic heart disease and other diseases of the circulatory system: Secondary | ICD-10-CM

## 2023-03-14 DIAGNOSIS — D631 Anemia in chronic kidney disease: Secondary | ICD-10-CM | POA: Diagnosis present

## 2023-03-14 DIAGNOSIS — Z1152 Encounter for screening for COVID-19: Secondary | ICD-10-CM

## 2023-03-14 DIAGNOSIS — N186 End stage renal disease: Secondary | ICD-10-CM | POA: Diagnosis present

## 2023-03-14 DIAGNOSIS — R0902 Hypoxemia: Secondary | ICD-10-CM

## 2023-03-14 DIAGNOSIS — E875 Hyperkalemia: Secondary | ICD-10-CM | POA: Diagnosis present

## 2023-03-14 DIAGNOSIS — Z992 Dependence on renal dialysis: Secondary | ICD-10-CM | POA: Diagnosis not present

## 2023-03-14 DIAGNOSIS — E8770 Fluid overload, unspecified: Principal | ICD-10-CM | POA: Diagnosis present

## 2023-03-14 DIAGNOSIS — Z91158 Patient's noncompliance with renal dialysis for other reason: Secondary | ICD-10-CM | POA: Diagnosis not present

## 2023-03-14 DIAGNOSIS — J9601 Acute respiratory failure with hypoxia: Secondary | ICD-10-CM | POA: Diagnosis present

## 2023-03-14 DIAGNOSIS — N2581 Secondary hyperparathyroidism of renal origin: Secondary | ICD-10-CM | POA: Diagnosis present

## 2023-03-14 DIAGNOSIS — R54 Age-related physical debility: Secondary | ICD-10-CM | POA: Diagnosis present

## 2023-03-14 DIAGNOSIS — E8779 Other fluid overload: Secondary | ICD-10-CM

## 2023-03-14 DIAGNOSIS — Z801 Family history of malignant neoplasm of trachea, bronchus and lung: Secondary | ICD-10-CM | POA: Diagnosis not present

## 2023-03-14 DIAGNOSIS — Z841 Family history of disorders of kidney and ureter: Secondary | ICD-10-CM | POA: Diagnosis not present

## 2023-03-14 DIAGNOSIS — J918 Pleural effusion in other conditions classified elsewhere: Secondary | ICD-10-CM | POA: Diagnosis present

## 2023-03-14 DIAGNOSIS — R001 Bradycardia, unspecified: Secondary | ICD-10-CM | POA: Diagnosis not present

## 2023-03-14 DIAGNOSIS — Z6830 Body mass index (BMI) 30.0-30.9, adult: Secondary | ICD-10-CM

## 2023-03-14 DIAGNOSIS — R0603 Acute respiratory distress: Secondary | ICD-10-CM | POA: Diagnosis present

## 2023-03-14 LAB — CBC WITH DIFFERENTIAL/PLATELET
Abs Immature Granulocytes: 0.05 10*3/uL (ref 0.00–0.07)
Basophils Absolute: 0 10*3/uL (ref 0.0–0.1)
Basophils Relative: 0 %
Eosinophils Absolute: 0 10*3/uL (ref 0.0–0.5)
Eosinophils Relative: 0 %
HCT: 35 % — ABNORMAL LOW (ref 36.0–46.0)
Hemoglobin: 11.1 g/dL — ABNORMAL LOW (ref 12.0–15.0)
Immature Granulocytes: 1 %
Lymphocytes Relative: 8 %
Lymphs Abs: 0.8 10*3/uL (ref 0.7–4.0)
MCH: 33.5 pg (ref 26.0–34.0)
MCHC: 31.7 g/dL (ref 30.0–36.0)
MCV: 105.7 fL — ABNORMAL HIGH (ref 80.0–100.0)
Monocytes Absolute: 1.2 10*3/uL — ABNORMAL HIGH (ref 0.1–1.0)
Monocytes Relative: 11 %
Neutro Abs: 8.2 10*3/uL — ABNORMAL HIGH (ref 1.7–7.7)
Neutrophils Relative %: 80 %
Platelets: 102 10*3/uL — ABNORMAL LOW (ref 150–400)
RBC: 3.31 MIL/uL — ABNORMAL LOW (ref 3.87–5.11)
RDW: 14.8 % (ref 11.5–15.5)
WBC: 10.2 10*3/uL (ref 4.0–10.5)
nRBC: 0 % (ref 0.0–0.2)

## 2023-03-14 LAB — COMPREHENSIVE METABOLIC PANEL
ALT: 30 U/L (ref 0–44)
AST: 52 U/L — ABNORMAL HIGH (ref 15–41)
Albumin: 3.4 g/dL — ABNORMAL LOW (ref 3.5–5.0)
Alkaline Phosphatase: 72 U/L (ref 38–126)
Anion gap: 22 — ABNORMAL HIGH (ref 5–15)
BUN: 47 mg/dL — ABNORMAL HIGH (ref 8–23)
CO2: 15 mmol/L — ABNORMAL LOW (ref 22–32)
Calcium: 9.6 mg/dL (ref 8.9–10.3)
Chloride: 103 mmol/L (ref 98–111)
Creatinine, Ser: 10.57 mg/dL — ABNORMAL HIGH (ref 0.44–1.00)
GFR, Estimated: 3 mL/min — ABNORMAL LOW (ref >60)
Glucose, Bld: 118 mg/dL — ABNORMAL HIGH (ref 70–99)
Potassium: 6 mmol/L — ABNORMAL HIGH (ref 3.5–5.1)
Sodium: 140 mmol/L (ref 135–145)
Total Bilirubin: 1.3 mg/dL — ABNORMAL HIGH (ref <1.2)
Total Protein: 6.8 g/dL (ref 6.5–8.1)

## 2023-03-14 LAB — I-STAT ARTERIAL BLOOD GAS, ED
Acid-base deficit: 1 mmol/L (ref 0.0–2.0)
Bicarbonate: 24.4 mmol/L (ref 20.0–28.0)
Calcium, Ion: 1.19 mmol/L (ref 1.15–1.40)
HCT: 35 % — ABNORMAL LOW (ref 36.0–46.0)
Hemoglobin: 11.9 g/dL — ABNORMAL LOW (ref 12.0–15.0)
O2 Saturation: 96 %
Patient temperature: 97.6
Potassium: 5.2 mmol/L — ABNORMAL HIGH (ref 3.5–5.1)
Sodium: 138 mmol/L (ref 135–145)
TCO2: 26 mmol/L (ref 22–32)
pCO2 arterial: 40.1 mm[Hg] (ref 32–48)
pH, Arterial: 7.39 (ref 7.35–7.45)
pO2, Arterial: 84 mm[Hg] (ref 83–108)

## 2023-03-14 LAB — I-STAT CHEM 8, ED
BUN: 69 mg/dL — ABNORMAL HIGH (ref 8–23)
Calcium, Ion: 0.97 mmol/L — ABNORMAL LOW (ref 1.15–1.40)
Chloride: 106 mmol/L (ref 98–111)
Creatinine, Ser: 10.9 mg/dL — ABNORMAL HIGH (ref 0.44–1.00)
Glucose, Bld: 159 mg/dL — ABNORMAL HIGH (ref 70–99)
HCT: 42 % (ref 36.0–46.0)
Hemoglobin: 14.3 g/dL (ref 12.0–15.0)
Potassium: 6 mmol/L — ABNORMAL HIGH (ref 3.5–5.1)
Sodium: 136 mmol/L (ref 135–145)
TCO2: 20 mmol/L — ABNORMAL LOW (ref 22–32)

## 2023-03-14 LAB — SARS CORONAVIRUS 2 BY RT PCR: SARS Coronavirus 2 by RT PCR: NEGATIVE

## 2023-03-14 MED ORDER — SODIUM CHLORIDE 0.9 % IV SOLN: 1.0000 g | INTRAVENOUS | Status: DC

## 2023-03-14 MED ORDER — ONDANSETRON HCL 4 MG PO TABS
4.0000 mg | ORAL_TABLET | Freq: Four times a day (QID) | ORAL | Status: DC | PRN
  Administered 2023-03-17: 4 mg via ORAL
  Filled 2023-03-14: qty 1

## 2023-03-14 MED ORDER — DEXTROSE 5 % IV BOLUS
250.0000 mL | INTRAVENOUS | Status: AC
  Administered 2023-03-15: 250 mL via INTRAVENOUS

## 2023-03-14 MED ORDER — ONDANSETRON HCL 4 MG/2ML IJ SOLN
4.0000 mg | Freq: Four times a day (QID) | INTRAMUSCULAR | Status: DC | PRN
  Administered 2023-03-16: 4 mg via INTRAVENOUS
  Filled 2023-03-14: qty 2

## 2023-03-14 MED ORDER — ACETAMINOPHEN 650 MG RE SUPP: 650.0000 mg | Freq: Four times a day (QID) | RECTAL | Status: DC | PRN

## 2023-03-14 MED ORDER — CALCIUM GLUCONATE-NACL 2-0.675 GM/100ML-% IV SOLN
2.0000 g | Freq: Once | INTRAVENOUS | Status: AC
  Administered 2023-03-15: 2000 mg via INTRAVENOUS
  Filled 2023-03-14: qty 100

## 2023-03-14 MED ORDER — SODIUM CHLORIDE 0.9 % IV SOLN: 250.0000 mL | INTRAVENOUS | Status: AC | PRN

## 2023-03-14 MED ORDER — SODIUM CHLORIDE 0.9 % IV SOLN
2.0000 g | Freq: Once | INTRAVENOUS | Status: AC
  Administered 2023-03-14: 2 g via INTRAVENOUS
  Filled 2023-03-14: qty 20

## 2023-03-14 MED ORDER — SODIUM CHLORIDE 0.9 % IV SOLN
2.0000 g | INTRAVENOUS | Status: DC
  Administered 2023-03-15 – 2023-03-18 (×4): 2 g via INTRAVENOUS
  Filled 2023-03-14 (×4): qty 20

## 2023-03-14 MED ORDER — DOXYCYCLINE HYCLATE 100 MG IV SOLR: 100.0000 mg | Freq: Two times a day (BID) | INTRAVENOUS | Status: DC

## 2023-03-14 MED ORDER — SODIUM CHLORIDE 0.9% FLUSH
3.0000 mL | INTRAVENOUS | Status: DC | PRN
  Administered 2023-03-15: 3 mL via INTRAVENOUS

## 2023-03-14 MED ORDER — CHLORHEXIDINE GLUCONATE CLOTH 2 % EX PADS
6.0000 | MEDICATED_PAD | Freq: Every day | CUTANEOUS | Status: DC
  Administered 2023-03-15 – 2023-03-19 (×5): 6 via TOPICAL

## 2023-03-14 MED ORDER — SODIUM ZIRCONIUM CYCLOSILICATE 10 G PO PACK
10.0000 g | PACK | Freq: Every evening | ORAL | Status: DC
  Administered 2023-03-15 (×2): 10 g via ORAL
  Filled 2023-03-14 (×2): qty 1

## 2023-03-14 MED ORDER — SODIUM CHLORIDE 0.9% FLUSH
3.0000 mL | Freq: Two times a day (BID) | INTRAVENOUS | Status: DC
  Administered 2023-03-15 – 2023-03-19 (×5): 3 mL via INTRAVENOUS

## 2023-03-14 MED ORDER — ONDANSETRON HCL 4 MG/2ML IJ SOLN
4.0000 mg | Freq: Once | INTRAMUSCULAR | Status: AC
  Administered 2023-03-14: 4 mg via INTRAVENOUS
  Filled 2023-03-14: qty 2

## 2023-03-14 MED ORDER — SENNOSIDES-DOCUSATE SODIUM 8.6-50 MG PO TABS: 1.0000 | ORAL_TABLET | Freq: Every evening | ORAL | Status: DC | PRN

## 2023-03-14 MED ORDER — DEXTROSE 5 % IV SOLN
500.0000 mg | Freq: Once | INTRAVENOUS | Status: AC
  Administered 2023-03-15: 500 mg via INTRAVENOUS
  Filled 2023-03-14: qty 5

## 2023-03-14 MED ORDER — INSULIN ASPART 100 UNIT/ML IJ SOLN
5.0000 [IU] | INTRAMUSCULAR | Status: AC
  Administered 2023-03-15: 5 [IU] via SUBCUTANEOUS

## 2023-03-14 MED ORDER — HEPARIN SODIUM (PORCINE) 5000 UNIT/ML IJ SOLN
5000.0000 [IU] | Freq: Three times a day (TID) | INTRAMUSCULAR | Status: DC
  Administered 2023-03-15 – 2023-03-19 (×12): 5000 [IU] via SUBCUTANEOUS
  Filled 2023-03-14 (×12): qty 1

## 2023-03-14 MED ORDER — SODIUM CHLORIDE 0.9% FLUSH
3.0000 mL | Freq: Two times a day (BID) | INTRAVENOUS | Status: DC
  Administered 2023-03-15 – 2023-03-19 (×9): 3 mL via INTRAVENOUS

## 2023-03-14 MED ORDER — ACETAMINOPHEN 325 MG PO TABS
650.0000 mg | ORAL_TABLET | Freq: Four times a day (QID) | ORAL | Status: DC | PRN
  Administered 2023-03-17: 650 mg via ORAL
  Filled 2023-03-14: qty 2

## 2023-03-14 NOTE — Progress Notes (Signed)
Pt. Was taken off bipap. Per. MD to see how she will do. Pt. Is currently resting comfortable on 5L with her VS stable at this time.

## 2023-03-14 NOTE — ED Notes (Signed)
IV team at bedside 

## 2023-03-14 NOTE — Consult Note (Signed)
[name] Admit Date: 03/14/2023 03/14/2023 [name] Requesting Physician:  Rubin Payor MD  Reason for Consult:  ESRD, AHRF, Hyperkalemia HPI:  64F ESRD MWF [facility] LUE AVF presented from home with 1 day of shortness of breath, nausea and vomiting.  At home EMS identified her with 60% SpO2 on room air and she was placed on a CPAP.  She did not attempt dialysis today.  In ED patient maintained on BiPAP with 100% FiO2.  Blood pressures are stable, no fevers.  Potassium of 6.0, BUN 47, bicarbonate 15.  Portable chest x-ray with bilateral airspace opacities right greater than left concerning for multifocal pneumonia.  Suspected bilateral pleural effusions as well.  Patient last received dialysis on 11/18.  She left at her EDW, with normal blood pressures.  Fluid status is very stable, EDW has been down titrated over the past 2 weeks.  Outpatient dialysis order: EDW 70.7 kg, BFR 350, DFR autoflow 1.5,  LUE AVF, 3.5h, 2K, 2 Ca, heparin 2200 units bolus every treatment, Mircera 120 mcg every 2 weeks last received 03/04/2023, calcitriol 1.25 mcg every treatment.   PMH Incudes: Hypertension, history of endometrial cancer status post hysterectomy  At the time of my evaluation patient is conversant on BiPAP.  Niece is at bedside.  Patient started on ceftriaxone and azithromycin.  Written for Lokelma, calcium gluconate.  ROS Balance of 12 systems is negative w/ exceptions as above  PMH  Past Medical History:  Diagnosis Date   Anemia    Arthritis    Cancer ([facility])    urterine- surgical removal only tratment   Chronic kidney disease    end stage 4   Environmental and seasonal allergies    affects sinuses   Heart murmur    no one has mentioned it lately- had it as a child   Hypertension    Limb cramps    leg, thigh, hands   Pneumonia    "young"   Shortness of breath dyspnea    With exerion   PSH  Past Surgical History:  Procedure Laterality Date   ABDOMINAL HYSTERECTOMY      complete   AV FISTULA PLACEMENT Left 01/23/2016   Procedure: LEFT RADIOCEPHALIC  ARTERIOVENOUS (AV) FISTULA CREATION;  Surgeon: Nada Libman, MD;  Location[facility];  Service: Vascular;  Laterality: Left;   AV FISTULA PLACEMENT Left 03/09/2016   Procedure: ARTERIOVENOUS (AV) FISTULA CREATION-LEFT UPPER ARM;  Surgeon: Fransisco Hertz, MD;  Location: [facility];  Service: Vascular;  Laterality: Left;   BREAST SURGERY Bilateral    4 surgeries, Cyst in each breast   COLONOSCOPY W/ POLYPECTOMY     DILATION AND CURETTAGE OF UTERUS     INSERTION OF DIALYSIS CATHETER N/A 03/09/2016   Procedure: INSERTION OF DIALYSIS CATHETER;  Surgeon: Fransisco Hertz, MD;  Location: [facility] OR;  Service: Vascular;  Laterality: N/A;   FH  Family History  Problem Relation Age of Onset   Cancer - Lung Mother    Renal Disease Father    Heart attack Father    SH  reports that she has never smoked. She has never used smokeless tobacco. She reports that she does not drink alcohol and does not use drugs. Allergies No Known Allergies Home medications Prior to Admission medications   Medication Sig Start Date End Date Taking? Authorizing Provider  lidocaine-prilocaine (EMLA) cream Apply 1 application topically daily as needed for pain. 05/23/18  Yes [provider]  B Complex-C-Folic Acid (DIALYVITE TABLET) TABS Take 1  tablet by mouth daily. Patient not taking: Reported on 03/14/2023 03/13/18   [provider]  calcitRIOL (ROCALTROL) 0.5 MCG capsule Take 1 capsule by mouth 2 (two) times daily. Patient not taking: Reported on 03/14/2023 11/26/15   [provider]  calcium acetate (PHOSLO) 667 MG capsule Take 1,334-2,001 mg by mouth See admin instructions. 2001mg  three times daily with meals and 1334mg  with two snacks daily Patient not taking: Reported on 03/14/2023 01/12/16   [provider]  nebivolol (BYSTOLIC) 10 MG tablet Take 10 mg by mouth at bedtime.  Patient not taking: Reported on 03/14/2023     [provider]    Current Medications Scheduled Meds:  heparin  5,000 Units Subcutaneous Q8H   insulin aspart  5 Units Subcutaneous STAT   sodium chloride flush  3 mL Intravenous Q12H   sodium chloride flush  3 mL Intravenous Q12H   sodium zirconium cyclosilicate  10 g Oral Nightly   Continuous Infusions:  sodium chloride     azithromycin     calcium gluconate     [START ON 03/15/2023] cefTRIAXone (ROCEPHIN)  IV     cefTRIAXone (ROCEPHIN)  IV     dextrose     doxycycline (VIBRAMYCIN) IV     PRN Meds:.sodium chloride, acetaminophen **OR** acetaminophen, ondansetron **OR** ondansetron (ZOFRAN) IV, senna-docusate, sodium chloride flush  CBC Recent Labs  Lab 03/14/23 1650 03/14/23 1850 03/14/23 1853  WBC  --  10.2  --   NEUTROABS  --  8.2*  --   HGB 14.3 11.1* 11.9*  HCT 42.0 35.0* 35.0*  MCV  --  105.7*  --   PLT  --  102*  --    Basic Metabolic Panel Recent Labs  Lab 03/14/23 1650 03/14/23 1807 03/14/23 1853  NA 136 140 138  K 6.0* 6.0* 5.2*  CL 106 103  --   CO2  --  15*  --   GLUCOSE 159* 118*  --   BUN 69* 47*  --   CREATININE 10.90* 10.57*  --   CALCIUM  --  9.6  --     Physical Exam  Blood pressure 114/75, pulse 80, temperature 98.8 F (37.1 C), temperature source Oral, resp. rate 20, height 5\' 1"  (1.549 m), weight 70.3 kg, SpO2 100%. GEN: Elderly female on BiPAP, conversant ENT: NCAT, limited exam EYES: EOMI CV: Regular, normal S1 and S2 PULM: Diminished in the bases, normal air movement otherwise with occasional crackles ABD: Soft, nontender SKIN: No rashes or lesions EXT: No peripheral edema LUE AVF with 2 aneurysmal regions without ulceration or thinning.  Bruit and thrill present   Assessment 15F ESRD MWF with AHRF 2/2 multifocal pneumonia and/or pulmonary edema.  AHRF: Multifocal pneumonia, pulmonary edema considerations.  Ceftriaxone and azithromycin per [facility].  ESRD MWF missed treatment earlier today Hyperkalemia, mild, not  cotherapy ordered by admitting team Metabolic acidosis with increased anion gap Mild anemia, at baseline, not due for ESA until 11/15 HTN/Vol: Bps stable CKD-BMD stable outpt status  Plan HD tonight as able, 2-3L UF hopefully will assist with coming off of BiPAP. 2K, 3.5h, tight heparin. Use AVF  ABX per [facility] Daily weights, Daily Renal Panel, Strict I/Os, Avoid nephrotoxins (NSAIDs, judicious IV Contrast)  [name]  03/14/2023, 9:09 PM

## 2023-03-14 NOTE — ED Notes (Signed)
ED attending physician, Dr. Rubin Payor, at bedside to re-assess patient. Patient coughing, tachypnic, labored breathing. Patient spitting up copious mucous and utilizing Yankauer suction to assist with secretions.

## 2023-03-14 NOTE — ED Notes (Signed)
RN entered room to give medications and patient's IV was out of the skin but remained taped to the patient's arm. Provider made aware of the delay in medication administration d/t lack of access.

## 2023-03-14 NOTE — ED Notes (Signed)
Per ED attending physician, Dr. Rubin Payor, plan will be to attempt bipap.

## 2023-03-14 NOTE — H&P (Signed)
History and Physical    [name] EXB:[id number] DOB: 1940-10-04 DOA: 03/14/2023  PCP: Gean Birchwood, [facility]   Patient coming from: Home   Chief Complaint:  Chief Complaint  Patient presents with   Respiratory Distress   ED TRIAGE note: Patient presents via EMS from home with nausea, vomiting, generalized chest pain, and shortness of breath since this morning. Per EMS, patient's SpO2 was 60% on room air with improvement to 85% on CPAP. Patient does not wear supplemental oxygen at baseline. Patient is a dialysis patient and received dialysis MWF but did not receive dialysis today. Patient is in distress and vomiting upon arrival. Patient placed on NRB at 15L. Per ED attending physician, Dr. Rubin Payor, obtain PIV access, administer IV zofran, and attempt BiPAP if possible.             HPI:  [name] is a 82 y.o. female with medical history significant of ESRD on HD MWF schedule, as a hypertension, anemia of chronic disease, endometrial cancer status post abdominal hysterectomy, morbid obesity, secondary hyperparathyroidism presented to emergency department for evaluation for nausea, chest pain shortness of breath.  Called EMS today found to have O2 sat 60% room air and improved to 85% with CPAP.  Patient reported she missed her dialysis today.  And reported feeling better with BiPAP. During my evaluation at the bedside patient has been transitioned to nasal cannula oxygen maintaining O2 sat in between 92 to 97% on 5 L.  Patient endorsing productive cough.  Patient denies fever, chill, nausea, chest pain, palpitation, constipation, diarrhea, abdominal pain and vomiting.  No other complaint at this time.  Patient reported she is compliant with dialysis and last HD was on Friday 11/8.   ED Course:  At presentation to ED temperature 97, tachypneic 34, currently 100% on BiPAP.  Blood pressure within normal range.  Heart rate 81. VBG showed pH 7.3, pCO2 40, pO2 84 bicarb  24. CBC unremarkable stable H&H 11.1 and 35. CMP showing hyperkalemia potassium 6, low bicarb 15, blood glucose 118, BUN 47, creatinine 10.57, anion gap 22.  Chest x-ray showing bilateral airspace disease right greater than left concerning for multifocal pneumonia.  In the ED patient received azithromycin and ceftriaxone.  ED physician consulted nephrologist Dr. Verna Czech for evaluation for dialysis.    Update, patient has been transition to BiPAP to nasal cannula oxygen currently on 5 to 6 L maintaining O2 sat 97%.  Hospitalist has been contacted for further management of acute hypoxic respiratory failure in the context of volume overload from missing dialysis and multifocal pneumonia.  Review of Systems:  Review of Systems  Constitutional:  Negative for chills and fever.  Respiratory:  Positive for sputum production and shortness of breath. Negative for cough and wheezing.   Cardiovascular:  Positive for leg swelling. Negative for chest pain and palpitations.  Gastrointestinal:  Negative for abdominal pain, heartburn, nausea and vomiting.  Musculoskeletal:  Negative for back pain, falls, joint pain, myalgias and neck pain.  Skin:  Negative for rash.  Neurological:  Negative for dizziness and headaches.  Psychiatric/Behavioral:  The patient is not nervous/anxious.     Past Medical History:  Diagnosis Date   Anemia    Arthritis    Cancer (HCC)    urterine- surgical removal only tratment   Chronic kidney disease    end stage 4   Environmental and seasonal allergies    affects sinuses   Heart murmur    no one has mentioned it lately- had  it as a child   Hypertension    Limb cramps    leg, thigh, hands   Pneumonia    "young"   Shortness of breath dyspnea    With exerion    Past Surgical History:  Procedure Laterality Date   ABDOMINAL HYSTERECTOMY     complete   AV FISTULA PLACEMENT Left 01/23/2016   Procedure: LEFT RADIOCEPHALIC  ARTERIOVENOUS (AV) FISTULA CREATION;   Surgeon: Nada Libman, MD;  Location: [facility] OR;  Service: Vascular;  Laterality: Left;   AV FISTULA PLACEMENT Left 03/09/2016   Procedure: ARTERIOVENOUS (AV) FISTULA CREATION-LEFT UPPER ARM;  Surgeon: Fransisco Hertz, MD;  Location: [facility] OR;  Service: Vascular;  Laterality: Left;   BREAST SURGERY Bilateral    4 surgeries, Cyst in each breast   COLONOSCOPY W/ POLYPECTOMY     DILATION AND CURETTAGE OF UTERUS     INSERTION OF DIALYSIS CATHETER N/A 03/09/2016   Procedure: INSERTION OF DIALYSIS CATHETER;  Surgeon: Fransisco Hertz, MD;  Location: [facility];  Service: Vascular;  Laterality: N/A;     reports that she has never smoked. She has never used smokeless tobacco. She reports that she does not drink alcohol and does not use drugs.  No Known Allergies  Family History  Problem Relation Age of Onset   Cancer - Lung Mother    Renal Disease Father    Heart attack Father     Prior to Admission medications   Medication Sig Start Date End Date Taking? Authorizing Provider  lidocaine-prilocaine (EMLA) cream Apply 1 application topically daily as needed for pain. 05/23/18  Yes [provider]  B Complex-C-Folic Acid (DIALYVITE TABLET) TABS Take 1 tablet by mouth daily. Patient not taking: Reported on 03/14/2023 03/13/18   [provider]  calcitRIOL (ROCALTROL) 0.5 MCG capsule Take 1 capsule by mouth 2 (two) times daily. Patient not taking: Reported on 03/14/2023 11/26/15   [provider]  calcium acetate (PHOSLO) 667 MG capsule Take 1,334-2,001 mg by mouth See admin instructions. 2001mg  three times daily with meals and 1334mg  with two snacks daily Patient not taking: Reported on 03/14/2023 01/12/16   [provider]  nebivolol (BYSTOLIC) 10 MG tablet Take 10 mg by mouth at bedtime.  Patient not taking: Reported on 03/14/2023    [provider]     Physical Exam: Vitals:   03/15/23 0000 03/15/23 0045 03/15/23 0215 03/15/23 0241  BP: 117/61 135/76 108/62    Pulse: 83 89 79 78  Resp: (!) 27 (!) 25 (!) 23 (!) 27  Temp:      TempSrc:      SpO2: 98% 94% 100% 100%  Weight:      Height:        Physical Exam Constitutional:      General: She is not in acute distress.    Appearance: She is not ill-appearing.  HENT:     Nose: Nose normal. No congestion or rhinorrhea.     Mouth/Throat:     Mouth: Mucous membranes are moist.  Eyes:     Conjunctiva/sclera: Conjunctivae normal.  Cardiovascular:     Rate and Rhythm: Normal rate and regular rhythm.     Pulses: Normal pulses.     Heart sounds: Normal heart sounds.  Pulmonary:     Effort: No respiratory distress.     Breath sounds: Normal breath sounds. No wheezing, rhonchi or rales.  Abdominal:     General: Bowel sounds are normal. There is no distension.  Musculoskeletal:  General: No swelling.     Cervical back: Neck supple.     Right lower leg: Edema present.     Left lower leg: Edema present.  Skin:    Capillary Refill: Capillary refill takes less than 2 seconds.  Neurological:     Mental Status: She is alert and oriented to person, place, and time.  Psychiatric:        Mood and Affect: Mood normal.        Thought Content: Thought content normal.        Judgment: Judgment normal.      Labs on Admission: I have personally reviewed following labs and imaging studies  CBC: Recent Labs  Lab 03/14/23 1650 03/14/23 1850 03/14/23 1853 03/14/23 2353  WBC  --  10.2  --  9.2  NEUTROABS  --  8.2*  --   --   HGB 14.3 11.1* 11.9* 11.1*  HCT 42.0 35.0* 35.0* 33.9*  MCV  --  105.7*  --  106.3*  PLT  --  102*  --  108*   Basic Metabolic Panel: Recent Labs  Lab 03/14/23 1650 03/14/23 1807 03/14/23 1853  NA 136 140 138  K 6.0* 6.0* 5.2*  CL 106 103  --   CO2  --  15*  --   GLUCOSE 159* 118*  --   BUN 69* 47*  --   CREATININE 10.90* 10.57*  --   CALCIUM  --  9.6  --    GFR: Estimated Creatinine Clearance: 3.7 mL/min (A) (by C-G formula based on SCr of 10.57 mg/dL  (H)). Liver Function Tests: Recent Labs  Lab 03/14/23 1807  AST 52*  ALT 30  ALKPHOS 72  BILITOT 1.3*  PROT 6.8  ALBUMIN 3.4*   No results for input(s): "LIPASE", "AMYLASE" in the last 168 hours. No results for input(s): "AMMONIA" in the last 168 hours. Coagulation Profile: No results for input(s): "INR", "PROTIME" in the last 168 hours. Cardiac Enzymes: No results for input(s): "CKTOTAL", "CKMB", "CKMBINDEX", "TROPONINI", "TROPONINIHS" in the last 168 hours. BNP (last 3 results) No results for input(s): "BNP" in the last 8760 hours. HbA1C: No results for input(s): "HGBA1C" in the last 72 hours. CBG: Recent Labs  Lab 03/15/23 0018  GLUCAP 104*   Lipid Profile: No results for input(s): "CHOL", "HDL", "LDLCALC", "TRIG", "CHOLHDL", "LDLDIRECT" in the last 72 hours. Thyroid Function Tests: No results for input(s): "TSH", "T4TOTAL", "FREET4", "T3FREE", "THYROIDAB" in the last 72 hours. Anemia Panel: No results for input(s): "VITAMINB12", "FOLATE", "FERRITIN", "TIBC", "IRON", "RETICCTPCT" in the last 72 hours. Urine analysis: No results found for: "COLORURINE", "APPEARANCEUR", "LABSPEC", "PHURINE", "GLUCOSEU", "HGBUR", "BILIRUBINUR", "KETONESUR", "PROTEINUR", "UROBILINOGEN", "NITRITE", "LEUKOCYTESUR"  Radiological Exams on Admission: I have personally reviewed images DG Chest Portable 1 View  Result Date: 03/14/2023 CLINICAL DATA:  Shortness of breath EXAM: PORTABLE CHEST 1 VIEW COMPARISON:  05/05/2022 FINDINGS: The cardiomediastinal silhouette is obscured. Aortic atherosclerotic calcification. Airspace opacities throughout the right lung and in the left mid and lower lung. Suspected bilateral pleural effusions. No pneumothorax. IMPRESSION: 1. Bilateral airspace opacities, right greater than left, concerning for multifocal pneumonia. Electronically Signed   By: Minerva Fester M.D.   On: 03/14/2023 19:38    EKG: My personal interpretation of EKG shows: EKG shows sinus rhythm  heart rate 90.  There is no ST anterior abnormality    Assessment/Plan: Principal Problem:   Acute hypoxic respiratory failure (HCC) Active Problems:   ESRD on dialysis [facility])   Multifocal pneumonia   Anemia  of chronic disease   Hyperkalemia   Essential hypertension   Secondary hyperparathyroidism (HCC)    Assessment and Plan: Acute hypoxic respiratory failure-second to volume overload from missing dialysis and multifocal pneumonia -EMS found O2 sat 60% room air afterward she was on 85% initially on BiPAP now has been improved to 100%.  Continue on BiPAP.  Wean down to high flow oxygen versus nasal cannula oxygen as patient tolerates. -Currently patient is on nasal oxygen 6 L maintaining O2 sat 98 to 100% - Continue to treat for pneumonia and patient will have dialysis later tonight versus earlier in the morning. -Chest x-ray showed bilateral airspace opacity right greater than left concerning for multifocal pneumonia.  Bilateral pleural effusion.  No pneumothorax. -ABG showed pH 7.3, pCO2 40, pO2 84 and bicarb 24 while on BiPAP. - Continue to wean down oxygen to room air as patient tolerates. - Continue pulmonary toiletry, aspiration precaution and Xopenex as needed for wheezing shortness of breath. -Continue check pulse ox and supplemental oxygen. -Continue cardiac monitoring   Multifocal pneumonia -Patient has been admitted for acute hypoxic respiratory failure.  Initiate me on BiPAP for 4 hours has been transition to high flow then nasal cannula oxygen currently maintaining O2 sat 92 to 97% on 5 L. -Afebrile.  Other than hypoxia patient is hemodynamically stable.  Blood pressure within good range.  CBC no leukocytosis. - In the ED patient received ceftriaxone and azithromycin. -Negative respiratory 20 path panel. -Checking blood culture and sputum cultures, MRSA nasal PCR. -Continue IV ceftriaxone 2 daily and doxycycline 100 mg twice daily.  Will follow-up with blood culture and  sputum culture results for further antibiotic guidance.  ESRD on HD MWF schedule Volume overloaded And uninterpretable if acidosis-due to ESRD -Acute hypoxic respiratory failure and volume overloaded.  Chest x-ray showed pleural effusion and physical exam showed 2+ pitting edema bilateral lower extremities. - Initially on BiPAP with sats been transition to high flow to Platinum oxygen currently on 6 L. -CMP showed sodium 136, hyperkalemia 6, BUN 47 and low low bicarb 15. -In the ED patient has been treated for hyperkalemia. -Patient reported not taking calcitriol and PhosLo anymore. - Consulted nephrology Dr. Marisue Humble, planning to dialysis tonight with goal to remove 2 to 3 L of fluid. -Appreciate nephrology input. -Continue to monitor CMP  Hyperkalemia - Patient has been treated for hyperkalemia with calcium gluconate, insulin and dextrose.  Got Lokelma 10 g one-time dose.  Going for dialysis tonight. - Continue to monitor potassium level.  Essential hypertension -Blood pressure within good range.  Patient reported not taking Bystolic anymore. -Continue hydralazine as needed  Anemia of chronic disease -Stable H&H 11.1 and 35.  Elevated MCV 105.  Check anemia panel.  Secondary hyperparathyroidism -Patient reported not taking PhosLo and Rocaltrol anymore.  Not initiating at this moment.  Morbid obesity -BMI 29.  Counseled patient to compliant with dialysis and continue renal diet.  Encourage patient try to be mobile as much as she can.  Age-related debility -At baseline patient reported using his scooter outside of the house for movement however at home she tries to ambulate without any walker and rollator.  Physical exam showing morbid obesity and concern for high risk of fall. - Consulting in patient PT and OT evaluation of balance.  DVT prophylaxis: Heparin 5000 3 times daily, SCD Code Status:  Full Code Diet: Renal diet with fluid restriction 1.2 L/day Family Communication: No  family member at bedside now. Disposition Plan: Pending clinical improvement.  Pending blood cultures sputum culture result.  Patient going to have dialysis tonight hoping that volume status will improve with that.  Pending PT and OT evaluation possible SNF versus discharge to home with home PT/OT Consults: Neph loggia, PT and OT Admission status:   Inpatient, Step Down Unit  Severity of Illness: The appropriate patient status for this patient is INPATIENT. Inpatient status is judged to be reasonable and necessary in order to provide the required intensity of service to ensure the patient's safety. The patient's presenting symptoms, physical exam findings, and initial radiographic and laboratory data in the context of their chronic comorbidities is felt to place them at high risk for further clinical deterioration. Furthermore, it is not anticipated that the patient will be medically stable for discharge from the hospital within 2 midnights of admission.   * I certify that at the point of admission it is my clinical judgment that the patient will require inpatient hospital care spanning beyond 2 midnights from the point of admission due to high intensity of service, high risk for further deterioration and high frequency of surveillance required.Marland Kitchen    Tereasa Coop, MD [facility]  How to contact the [facility] Attending or Consulting provider 7A - 7P or covering provider during after hours 7P -7A, for this patient.  Check the care team in [facility] and look for a) attending/consulting [facility] provider listed and b) the [facility] team listed Log into [email or web address] and use [place]'s universal password to access. If you do not have the password, please contact the hospital operator. Locate the [facility] provider you are looking for under [facility] and page to a number that you can be directly reached. If you still have difficulty reaching the provider, please page the [facility] (Director on Call) for the  Hospitalists listed on amion for assistance.  03/15/2023, 5:17 AM

## 2023-03-14 NOTE — Progress Notes (Signed)
IVT requested to come to bedside to obtain more reliable access with blood draw.  Pt has been assessed by 2 ED rn's with [place]. This RN assessed LUE with [place], attempted stick without success. 2cc's of blood obtained prior to removing the catheter.  Given her poor vasculature and need for labs, recommend CVC.  Spoke with primary RN at bedside, [place] send to MD who was with another patient.

## 2023-03-14 NOTE — ED Notes (Signed)
IV team at bedside for IV access attempt. Patient A&O, no complaints at this time.

## 2023-03-14 NOTE — ED Triage Notes (Signed)
Delayed entry due to direct patient care. Patient presents via EMS from home with nausea, vomiting, generalized chest pain, and shortness of breath since this morning. Per EMS, patient's SpO2 was 60% on room air with improvement to 85% on CPAP. Patient does not wear supplemental oxygen at baseline. Patient is a dialysis patient and received dialysis MWF but did not receive dialysis today. Patient is in distress and vomiting upon arrival. Patient placed on NRB at 15L. Per ED attending physician, Dr. Rubin Payor, obtain PIV access, administer IV zofran, and attempt BiPAP if possible.

## 2023-03-14 NOTE — ED Provider Notes (Signed)
[place] EMERGENCY DEPARTMENT AT [facility] Provider Note   CSN: [id number] Arrival date & time: 03/14/23  1559     History  Chief Complaint  Patient presents with   Respiratory Distress    [name] is a 82 y.o. female.  HPI Patient presents with reported nausea chest pain and shortness of breath.  Reportedly had sats of 60% on room air.  Went up to 85% on CPAP.  Is a Monday Wednesday Friday dialysis patient but did not go to dialysis today.  Had felt better on BiPAP.  States she feels if she is carrying extra fluid.  No fevers.   Past Medical History:  Diagnosis Date   Anemia    Arthritis    Cancer (HCC)    urterine- surgical removal only tratment   Chronic kidney disease    end stage 4   Environmental and seasonal allergies    affects sinuses   Heart murmur    no one has mentioned it lately- had it as a child   Hypertension    Limb cramps    leg, thigh, hands   Pneumonia    "young"   Shortness of breath dyspnea    With exerion    Home Medications Prior to Admission medications   Medication Sig Start Date End Date Taking? Authorizing Provider  lidocaine-prilocaine (EMLA) cream Apply 1 application topically daily as needed for pain. 05/23/18  Yes [provider]  B Complex-C-Folic Acid (DIALYVITE TABLET) TABS Take 1 tablet by mouth daily. Patient not taking: Reported on 03/14/2023 03/13/18   [provider]  calcitRIOL (ROCALTROL) 0.5 MCG capsule Take 1 capsule by mouth 2 (two) times daily. Patient not taking: Reported on 03/14/2023 11/26/15   [provider]  calcium acetate (PHOSLO) 667 MG capsule Take 1,334-2,001 mg by mouth See admin instructions. 2001mg  three times daily with meals and 1334mg  with two snacks daily Patient not taking: Reported on 03/14/2023 01/12/16   [provider]  nebivolol (BYSTOLIC) 10 MG tablet Take 10 mg by mouth at bedtime.  Patient not taking: Reported on 03/14/2023    [provider]      Allergies    Patient has no known allergies.    Review of Systems   Review of Systems  Physical Exam Updated Vital Signs BP 117/70   Pulse 85   Temp 98.9 F (37.2 C)   Resp (!) 21   Ht 5\' 1"  (1.549 m)   Wt 70.3 kg   LMP  (LMP Unknown)   SpO2 97%   BMI 29.29 kg/m  Physical Exam Vitals and nursing note reviewed.  HENT:     Head: Normocephalic.  Cardiovascular:     Rate and Rhythm: Normal rate.  Pulmonary:     Comments: Diffuse harsh breath sounds with rales diffusely. Musculoskeletal:     Right lower leg: Edema present.     Left lower leg: Edema present.     Comments: Dialysis access left upper arm.  Neurological:     Mental Status: She is alert and oriented to person, place, and time.     ED Results / Procedures / Treatments   Labs (all labs ordered are listed, but only abnormal results are displayed) Labs Reviewed  CBC WITH DIFFERENTIAL/PLATELET - Abnormal; Notable for the following components:      Result Value   RBC 3.31 (*)    Hemoglobin 11.1 (*)    HCT 35.0 (*)    MCV 105.7 (*)  Platelets 102 (*)    Neutro Abs 8.2 (*)    Monocytes Absolute 1.2 (*)    All other components within normal limits  COMPREHENSIVE METABOLIC PANEL - Abnormal; Notable for the following components:   Potassium 6.0 (*)    CO2 15 (*)    Glucose, Bld 118 (*)    BUN 47 (*)    Creatinine, Ser 10.57 (*)    Albumin 3.4 (*)    AST 52 (*)    Total Bilirubin 1.3 (*)    GFR, Estimated 3 (*)    Anion gap 22 (*)    All other components within normal limits  I-STAT CHEM 8, ED - Abnormal; Notable for the following components:   Potassium 6.0 (*)    BUN 69 (*)    Creatinine, Ser 10.90 (*)    Glucose, Bld 159 (*)    Calcium, Ion 0.97 (*)    TCO2 20 (*)    All other components within normal limits  I-STAT ARTERIAL BLOOD GAS, ED - Abnormal; Notable for the following components:   Potassium 5.2 (*)    HCT 35.0 (*)    Hemoglobin 11.9 (*)    All other components  within normal limits  SARS CORONAVIRUS 2 BY RT PCR  CULTURE, BLOOD (ROUTINE X 2)  CULTURE, BLOOD (ROUTINE X 2)  EXPECTORATED SPUTUM ASSESSMENT W GRAM STAIN, RFLX TO RESP C  RESPIRATORY PANEL BY PCR  MRSA NEXT GEN BY PCR, NASAL  CBC WITH DIFFERENTIAL/PLATELET  LEGIONELLA PNEUMOPHILA SEROGP 1 UR AG  STREP PNEUMONIAE URINARY ANTIGEN  CBC  COMPREHENSIVE METABOLIC PANEL  CBC  HEPATITIS B SURFACE ANTIGEN  HEPATITIS B SURFACE ANTIBODY, QUANTITATIVE  HEPATITIS PANEL, ACUTE    EKG EKG Interpretation Date/Time:  Monday March 14 2023 16:35:02 EST Ventricular Rate:  90 PR Interval:  147 QRS Duration:  82 QT Interval:  388 QTC Calculation: 475 R Axis:   2  Text Interpretation: Sinus rhythm Probable anteroseptal infarct, old Borderline ST depression, lateral leads Confirmed by Benjiman Core [phone]) on 03/14/2023 4:38:02 PM  Radiology DG Chest Portable 1 View  Result Date: 03/14/2023 CLINICAL DATA:  Shortness of breath EXAM: PORTABLE CHEST 1 VIEW COMPARISON:  05/05/2022 FINDINGS: The cardiomediastinal silhouette is obscured. Aortic atherosclerotic calcification. Airspace opacities throughout the right lung and in the left mid and lower lung. Suspected bilateral pleural effusions. No pneumothorax. IMPRESSION: 1. Bilateral airspace opacities, right greater than left, concerning for multifocal pneumonia. Electronically Signed   By: Minerva Fester M.D.   On: 03/14/2023 19:38    Procedures Procedures    Medications Ordered in ED Medications  cefTRIAXone (ROCEPHIN) 2 g in sodium chloride 0.9 % 100 mL IVPB (has no administration in time range)  azithromycin (ZITHROMAX) 500 mg in dextrose 5 % 250 mL IVPB (has no administration in time range)  doxycycline (VIBRAMYCIN) 100 mg in dextrose 5 % 250 mL IVPB (has no administration in time range)  heparin injection 5,000 Units (has no administration in time range)  sodium chloride flush (NS) 0.9 % injection 3 mL (3 mLs Intravenous Not Given  03/14/23 2342)  calcium gluconate 2 g/ 100 mL sodium chloride IVPB (has no administration in time range)  sodium zirconium cyclosilicate (LOKELMA) packet 10 g (has no administration in time range)  sodium chloride flush (NS) 0.9 % injection 3 mL (3 mLs Intravenous Not Given 03/14/23 2342)  sodium chloride flush (NS) 0.9 % injection 3 mL (has no administration in time range)  0.9 %  sodium chloride infusion (has  no administration in time range)  acetaminophen (TYLENOL) tablet 650 mg (has no administration in time range)    Or  acetaminophen (TYLENOL) suppository 650 mg (has no administration in time range)  senna-docusate (Senokot-S) tablet 1 tablet (has no administration in time range)  ondansetron (ZOFRAN) tablet 4 mg (has no administration in time range)    Or  ondansetron (ZOFRAN) injection 4 mg (has no administration in time range)  dextrose 5 % bolus 250 mL (has no administration in time range)  insulin aspart (novoLOG) injection 5 Units (has no administration in time range)  Chlorhexidine Gluconate Cloth 2 % PADS 6 each (has no administration in time range)  cefTRIAXone (ROCEPHIN) 2 g in sodium chloride 0.9 % 100 mL IVPB (has no administration in time range)  ondansetron (ZOFRAN) injection 4 mg (4 mg Intravenous Given 03/14/23 1632)    ED Course/ Medical Decision Making/ A&P                                 Medical Decision Making Amount and/or Complexity of Data Reviewed Labs: ordered. Radiology: ordered.  Risk Prescription drug management. Decision regarding hospitalization.   Patient with dyspnea.  Also nausea and vomiting.  Missed dialysis today.  Rather severely hypoxic.  However began to vomit upon arrival in the ER.  Access will be obtained and given Zofran.  For now off CPAP which she was on.  Sats had been 85% on CPAP but actually improved coming off CPAP.  Differential diagnosis includes pneumonia, volume overload, viral infection.  Will get x-ray.  Will get basic  blood work.  X-ray shows infiltrate bilaterally, particularly on the right.  Pneumonia versus CHF.  Independently interpreted me but also reviewed radiology read of more likely pneumonia.  White count reassuring.  ABG done on BiPAP and is overall reassuring.  BMP does show renal failure with a mild hyperkalemia with potassium at 6.  EKG reassuring.  Has been on BiPAP will now attempt to titrate.  Patient states she is feeling better.  Will discuss with nephrology and will discuss with unassigned medicine for admission.  Difficult IV access.  Does have 2 peripheral lines at this time.   CRITICAL CARE Performed by: Benjiman Core Total critical care time: 40 minutes Critical care time was exclusive of separately billable procedures and treating other patients. Critical care was necessary to treat or prevent imminent or life-threatening deterioration. Critical care was time spent personally by me on the following activities: development of treatment plan with patient and/or surrogate as well as nursing, discussions with consultants, evaluation of patient's response to treatment, examination of patient, obtaining history from patient or surrogate, ordering and performing treatments and interventions, ordering and review of laboratory studies, ordering and review of radiographic studies, pulse oximetry and re-evaluation of patient's condition.        Final Clinical Impression(s) / ED Diagnoses Final diagnoses:  Community acquired pneumonia, unspecified laterality  End stage renal disease on dialysis [facility]  Hypoxia    Rx / DC Orders ED Discharge Orders     None         Benjiman Core, MD 03/14/23 2357

## 2023-03-15 DIAGNOSIS — J9601 Acute respiratory failure with hypoxia: Secondary | ICD-10-CM | POA: Diagnosis not present

## 2023-03-15 LAB — CBC
HCT: 33.9 % — ABNORMAL LOW (ref 36.0–46.0)
HCT: 34 % — ABNORMAL LOW (ref 36.0–46.0)
Hemoglobin: 11.1 g/dL — ABNORMAL LOW (ref 12.0–15.0)
Hemoglobin: 11.1 g/dL — ABNORMAL LOW (ref 12.0–15.0)
MCH: 34.8 pg — ABNORMAL HIGH (ref 26.0–34.0)
MCH: 35 pg — ABNORMAL HIGH (ref 26.0–34.0)
MCHC: 32.6 g/dL (ref 30.0–36.0)
MCHC: 32.7 g/dL (ref 30.0–36.0)
MCV: 106.3 fL — ABNORMAL HIGH (ref 80.0–100.0)
MCV: 107.3 fL — ABNORMAL HIGH (ref 80.0–100.0)
Platelets: 108 10*3/uL — ABNORMAL LOW (ref 150–400)
Platelets: 126 10*3/uL — ABNORMAL LOW (ref 150–400)
RBC: 3.17 MIL/uL — ABNORMAL LOW (ref 3.87–5.11)
RBC: 3.19 MIL/uL — ABNORMAL LOW (ref 3.87–5.11)
RDW: 14.7 % (ref 11.5–15.5)
RDW: 14.8 % (ref 11.5–15.5)
WBC: 8.8 10*3/uL (ref 4.0–10.5)
WBC: 9.2 10*3/uL (ref 4.0–10.5)
nRBC: 0 % (ref 0.0–0.2)
nRBC: 0 % (ref 0.0–0.2)

## 2023-03-15 LAB — I-STAT ARTERIAL BLOOD GAS, ED
Acid-Base Excess: 1 mmol/L (ref 0.0–2.0)
Bicarbonate: 24.7 mmol/L (ref 20.0–28.0)
Calcium, Ion: 1.19 mmol/L (ref 1.15–1.40)
HCT: 32 % — ABNORMAL LOW (ref 36.0–46.0)
Hemoglobin: 10.9 g/dL — ABNORMAL LOW (ref 12.0–15.0)
O2 Saturation: 99 %
Patient temperature: 37.2
Potassium: 4.9 mmol/L (ref 3.5–5.1)
Sodium: 134 mmol/L — ABNORMAL LOW (ref 135–145)
TCO2: 26 mmol/L (ref 22–32)
pCO2 arterial: 35.7 mm[Hg] (ref 32–48)
pH, Arterial: 7.448 (ref 7.35–7.45)
pO2, Arterial: 140 mm[Hg] — ABNORMAL HIGH (ref 83–108)

## 2023-03-15 LAB — RESPIRATORY PANEL BY PCR

## 2023-03-15 LAB — FOLATE: Folate: 9.6 ng/mL (ref >5.9)

## 2023-03-15 LAB — COMPREHENSIVE METABOLIC PANEL
ALT: 33 U/L (ref 0–44)
AST: 47 U/L — ABNORMAL HIGH (ref 15–41)
Albumin: 3 g/dL — ABNORMAL LOW (ref 3.5–5.0)
Alkaline Phosphatase: 63 U/L (ref 38–126)
Anion gap: 17 — ABNORMAL HIGH (ref 5–15)
BUN: 55 mg/dL — ABNORMAL HIGH (ref 8–23)
CO2: 25 mmol/L (ref 22–32)
Calcium: 9.6 mg/dL (ref 8.9–10.3)
Chloride: 96 mmol/L — ABNORMAL LOW (ref 98–111)
Creatinine, Ser: 11.04 mg/dL — ABNORMAL HIGH (ref 0.44–1.00)
GFR, Estimated: 3 mL/min — ABNORMAL LOW (ref >60)
Glucose, Bld: 83 mg/dL (ref 70–99)
Potassium: 5.5 mmol/L — ABNORMAL HIGH (ref 3.5–5.1)
Sodium: 138 mmol/L (ref 135–145)
Total Bilirubin: 1.4 mg/dL — ABNORMAL HIGH (ref <1.2)
Total Protein: 6.4 g/dL — ABNORMAL LOW (ref 6.5–8.1)

## 2023-03-15 LAB — RETICULOCYTES
Immature Retic Fract: 24 % — ABNORMAL HIGH (ref 2.3–15.9)
RBC.: 3.2 MIL/uL — ABNORMAL LOW (ref 3.87–5.11)
Retic Count, Absolute: 121.3 10*3/uL (ref 19.0–186.0)
Retic Ct Pct: 3.8 % — ABNORMAL HIGH (ref 0.4–3.1)

## 2023-03-15 LAB — IRON AND TIBC
Iron: 32 ug/dL (ref 28–170)
Saturation Ratios: 16 % (ref 10.4–31.8)
TIBC: 196 ug/dL — ABNORMAL LOW (ref 250–450)
UIBC: 164 ug/dL

## 2023-03-15 LAB — HEPATITIS PANEL, ACUTE
HCV Ab: NONREACTIVE
Hep A IgM: NONREACTIVE
Hep B C IgM: NONREACTIVE
Hepatitis B Surface Ag: NONREACTIVE

## 2023-03-15 LAB — CBG MONITORING, ED: Glucose-Capillary: 104 mg/dL — ABNORMAL HIGH (ref 70–99)

## 2023-03-15 LAB — MRSA NEXT GEN BY PCR, NASAL: MRSA by PCR Next Gen: NOT DETECTED

## 2023-03-15 LAB — HEPATITIS B SURFACE ANTIGEN: Hepatitis B Surface Ag: NONREACTIVE

## 2023-03-15 LAB — PROCALCITONIN: Procalcitonin: 1.09 ng/mL

## 2023-03-15 LAB — FERRITIN: Ferritin: 1470 ng/mL — ABNORMAL HIGH (ref 11–307)

## 2023-03-15 LAB — VITAMIN B12: Vitamin B-12: 320 pg/mL (ref 180–914)

## 2023-03-15 LAB — PHOSPHORUS: Phosphorus: 5 mg/dL — ABNORMAL HIGH (ref 2.5–4.6)

## 2023-03-15 MED ORDER — HYDRALAZINE HCL 20 MG/ML IJ SOLN: 5.0000 mg | Freq: Four times a day (QID) | INTRAMUSCULAR | Status: DC | PRN

## 2023-03-15 MED ORDER — LIDOCAINE HCL (PF) 1 % IJ SOLN: 5.0000 mL | INTRAMUSCULAR | Status: DC | PRN

## 2023-03-15 MED ORDER — HEPARIN SODIUM (PORCINE) 1000 UNIT/ML DIALYSIS
20.0000 [IU]/kg | INTRAMUSCULAR | Status: DC | PRN
Start: 2023-03-15 — End: 2023-03-15

## 2023-03-15 MED ORDER — PENTAFLUOROPROP-TETRAFLUOROETH EX AERO: 1.0000 | INHALATION_SPRAY | CUTANEOUS | Status: DC | PRN

## 2023-03-15 MED ORDER — LIDOCAINE-PRILOCAINE 2.5-2.5 % EX CREA: 1.0000 | TOPICAL_CREAM | CUTANEOUS | Status: DC | PRN

## 2023-03-15 MED ORDER — DOXYCYCLINE HYCLATE 100 MG IV SOLR
100.0000 mg | Freq: Two times a day (BID) | INTRAVENOUS | Status: DC
  Administered 2023-03-15 – 2023-03-17 (×6): 100 mg via INTRAVENOUS
  Filled 2023-03-15 (×6): qty 100

## 2023-03-15 MED ORDER — GUAIFENESIN ER 600 MG PO TB12
600.0000 mg | ORAL_TABLET | Freq: Two times a day (BID) | ORAL | Status: AC
  Administered 2023-03-15 – 2023-03-18 (×7): 600 mg via ORAL
  Filled 2023-03-15 (×7): qty 1

## 2023-03-15 NOTE — ED Notes (Signed)
BiPAP replaced on pt per order from MD.

## 2023-03-15 NOTE — ED Notes (Signed)
Pt is beginning to feel very short of breath. O2 is 93% on 6L [place] at this time. MD notified.

## 2023-03-15 NOTE — Progress Notes (Signed)
Respiratory distress due to volume overload and acute hypoxic respiratory failure: Patient's nurse reported that patient having respiratory distress even with maximum [place] oxygen. -Again placing BiPAP around 5:42 AM.  Plan to continue BiPAP for 3 to 4 hours and will wean down to [place] oxygen as patient tolerates. -Also checking ABG.  Tereasa Coop, MD [facility] 03/15/2023, 5:44 AM

## 2023-03-15 NOTE — ED Notes (Signed)
Phlebotomy informed this RN that they were unable to obtain morning labs.

## 2023-03-15 NOTE — ED Notes (Signed)
This phlebotomist stuck the patient three times to collect labs and was unable to obtain.

## 2023-03-15 NOTE — Evaluation (Signed)
Physical Therapy Evaluation Patient Details Name: [name] MRN: [id number] DOB: 13-Dec-1940 Today's Date: 03/15/2023  History of Present Illness  Patient is an 82 y/o female admitted 03/14/23 due to respiratory failure needing bipap.  States she missed HD as she was sick at home.  Found to have multifocal pneumonia. PMH positive for ESRD on HD, HTN, anemia, endometrial cancer s/p TAH, morbid obesity, and secondary hyperparathyroidism.  Clinical Impression  Patient presents with limited mobility due to needing NIV mechanical ventilation on BiPap.  Able to move arms and legs in bed and planned to attempt EOB, though MD and other staff entering to assess patient and planned HD later today.  PT will continue to follow in the acute setting.  Patient lived alone and performed all IADL's previously.  Hopeful for progression to independent but may need to consider options for inpatient rehab if needed.  PT will continue assessment as pt able.       If plan is discharge home, recommend the following: A little help with walking and/or transfers;Assistance with cooking/housework   Can travel by private vehicle        Equipment Recommendations Other (comment) (TBA)  Recommendations for Other Services       Functional Status Assessment Patient has had a recent decline in their functional status and demonstrates the ability to make significant improvements in function in a reasonable and predictable amount of time.     Precautions / Restrictions Precautions Precautions: Fall Precaution Comments: on bipap      Mobility  Bed Mobility               General bed mobility comments: deferred as pt on bipap and MD arrived and admissions RN arrived. pt awaiting HD    Transfers                        Ambulation/Gait                  Stairs            Wheelchair Mobility     Tilt Bed    Modified Rankin (Stroke Patients Only)       Balance                                              Pertinent Vitals/Pain Pain Assessment Pain Assessment: No/denies pain    Home Living Family/patient expects to be discharged to:: Private residence Living Arrangements: Alone   Type of Home: Apartment Home Access: Stairs to enter   [employer] of Steps: 1     Home Equipment: Agricultural consultant (2 wheels) Additional Comments: walks unaided at home    Prior Function Prior Level of Function : Independent/Modified Independent             Mobility Comments: drives, cooks, cleans       Extremity/Trunk Assessment   Upper Extremity Assessment Upper Extremity Assessment: Overall WFL for tasks assessed    Lower Extremity Assessment Lower Extremity Assessment: Generalized weakness    Cervical / Trunk Assessment Cervical / Trunk Assessment: Other exceptions Cervical / Trunk Exceptions: sitting up in stretcher in ED wearing bipap, difficulty noting posture  Communication   Communication Communication: Hearing impairment  Cognition Arousal: Alert Behavior During Therapy: WFL for tasks assessed/performed Overall Cognitive Status: Within Functional Limits for tasks assessed  General Comments      Exercises     Assessment/Plan    PT Assessment Patient needs continued PT services  PT Problem List Decreased activity tolerance;Decreased mobility       PT Treatment Interventions Functional mobility training;Therapeutic activities;Gait training;DME instruction    PT Goals (Current goals can be found in the Care Plan section)  Acute Rehab PT Goals Patient Stated Goal: return to independent PT Goal Formulation: With patient Time For Goal Achievement: 03/29/23 Potential to Achieve Goals: Good    Frequency Min 1X/week     Co-evaluation               AM-PAC PT "6 Clicks" Mobility  Outcome Measure Help needed turning from your back to your side  while in a flat bed without using bedrails?: Total Help needed moving from lying on your back to sitting on the side of a flat bed without using bedrails?: Total Help needed moving to and from a bed to a chair (including a wheelchair)?: Total Help needed standing up from a chair using your arms (e.g., wheelchair or bedside chair)?: Total Help needed to walk in hospital room?: Total Help needed climbing 3-5 steps with a railing? : Total 6 Click Score: 6    End of Session Equipment Utilized During Treatment: Oxygen Activity Tolerance: Treatment limited secondary to medical complications (Comment) (on bipap) Patient left: in bed;Other (comment) (MD in the room)   PT Visit Diagnosis: Other abnormalities of gait and mobility (R26.89)    Time: 4696-2952 PT Time Calculation (min) (ACUTE ONLY): 22 min   Charges:   PT Evaluation $PT Eval Moderate Complexity: 1 Mod   PT General Charges $$ ACUTE PT VISIT: 1 Visit         [name], PT Acute Rehabilitation Services Office:[phone] 03/15/2023   [name] 03/15/2023, 1:44 PM

## 2023-03-15 NOTE — ED Notes (Signed)
Pt alert, NAD, calm, interactive, taken to HD at this time.

## 2023-03-15 NOTE — Progress Notes (Signed)
Received patient in bed to unit.  Alert and oriented.  Informed consent signed and in chart.   TX duration:3  Patient tolerated well.  Transported back to the room  Alert, without acute distress.  Hand-off given to patient's nurse.   Access used: left AVF Access issues: none  Total UF removed: 2.5 Medication(s) given: doxycycline   03/15/23 1612  Vitals  Temp 98.9 F (37.2 C)  Temp Source Oral  BP (!) 145/92  MAP (mmHg) 109  BP Location Right Arm  BP Method Automatic  Patient Position (if appropriate) Lying  Pulse Rate 79  Pulse Rate Source Monitor  ECG Heart Rate 79  Resp (!) 25  Oxygen Therapy  SpO2 94 %  O2 Device Nasal Cannula  O2 Flow Rate (L/min) 2 L/min  During Treatment Monitoring  Dialysate Potassium Concentration 2  Dialysate Calcium Concentration 2.5  HD Safety Checks Performed Yes  Intra-Hemodialysis Comments Tx completed  Dialysis Fluid Bolus Normal Saline  Bolus Amount (mL) 300 mL      Braidan Ricciardi S [facility]

## 2023-03-15 NOTE — Procedures (Signed)
I have reviewed the HD regimen and made appropriate changes.  Vinson Moselle MD  [facility] 03/15/2023, 3:32 PM

## 2023-03-15 NOTE — Progress Notes (Signed)
PROGRESS NOTE    [name]  VWU:[id number] DOB: [birth year] DOA: 03/14/2023 PCP: Gean Birchwood, [facility]   Brief Narrative:  This 82 y.o. female with medical history significant of ESRD on HD MWF schedule, hypertension, anemia of chronic disease, endometrial cancer status post abdominal hysterectomy, morbid obesity, secondary hyperparathyroidism presented to ED for evaluation for nausea, chest pain and shortness of breath. She was found to have O2 sat 60% room air and improved to 85% with CPAP. Patient reports she missed her dialysis today due to not being able to go, she also reports feeling better with BiPAP. She was subsequently transitioned to 5 L [place], tolerating well. Chest x-ray showing bilateral airspace disease concerning for multifocal pneumonia,  Patient was empirically started on ceftriaxone and Zithromax.  Nephrology was consulted and patient underwent hemodialysis.  Patient was admitted for acute hypoxic respiratory failure in the setting of volume overload from missing hemodialysis and multifocal pneumonia.  Assessment & Plan:   Principal Problem:   Acute hypoxic respiratory failure (HCC) Active Problems:   ESRD on dialysis [facility])   Multifocal pneumonia   Anemia of chronic disease   Hyperkalemia   Essential hypertension   Secondary hyperparathyroidism (HCC)  Acute hypoxic respiratory failure: Multifactorial Volume overload from missing hemodialysis: Multifocal pneumonia: EMS found SpO2 60% on room air requiring BiPAP now has improved to 100%. Continue on BiPAP as needed and at night.  Wean oxygen as needed. ABG showed pH 7.3, pCO2 40, pO2 84 and bicarb 24 while on BiPAP. Chest x-ray showed bilateral airspace opacity concerning for multifocal pneumonia.  Continue ceftriaxone and Zithromax for multifocal pneumonia. Nephrology consulted.  Patient underwent hemodialysis last night Continue pulmonary toiletry, aspiration precaution and Xopenex as needed for wheezing  shortness of breath. Continue cardiac monitoring Follow-up blood and sputum culture.   Volume overload due to missing hemodialysis. ESRD on hemodialysis. Chest x-ray showed pleural effusion and physical exam showed 2+ pitting edema in bilateral lower extremities. Continue hemodialysis as per nephrology.   Hyperkalemia: Patient was treated with calcium gluconate, insulin and dextrose. Lokelma given. Manage with hemodialysis.  Essential hypertension: Blood pressure within good range. Continue hydralazine as needed.   Anemia of chronic disease: Stable H&H 11.1 and 35.  Elevated MCV 105.  Check anemia panel.   Secondary hyperparathyroidism: Patient reports not taking PhosLo and Rocaltrol anymore.     Morbid obesity -BMI 29.  Counseled patient to compliant with dialysis and continue renal diet.   Encourage patient try to be mobile as much as she can.   Age-related debility: At baseline patient reported using his scooter outside of the house for movement however at home she tries to ambulate without any walker and rollator.  Physical exam showing morbid obesity and concern for high risk of fall. Consulting in patient PT and OT evaluation of balance.    DVT prophylaxis:Heparin sq Code Status: Full code Family Communication: No family at bed side Disposition Plan:   Status is: Inpatient Remains inpatient appropriate because: Admitted for acute hypoxic respiratory failure due to fluid overload in the setting of missed hemodialysis.  Patient also found to have multifocal pneumonia, started on amp antibiotics.  Nephrology is consulted    Consultants:  Nephrology  Procedures: Hemodialysis  Antimicrobials:  Anti-infectives (From admission, onward)    Start     Dose/Rate Route Frequency Ordered Stop   03/15/23 2200  cefTRIAXone (ROCEPHIN) 2 g in sodium chloride 0.9 % 100 mL IVPB        2 g 200 mL/hr  over 30 Minutes Intravenous Every 24 hours 03/14/23 2329 03/21/23 2159    03/15/23 1200  cefTRIAXone (ROCEPHIN) 1 g in sodium chloride 0.9 % 100 mL IVPB  Status:  Discontinued        1 g 200 mL/hr over 30 Minutes Intravenous Every 24 hours 03/14/23 2020 03/14/23 2328   03/15/23 0100  doxycycline (VIBRAMYCIN) 100 mg in dextrose 5 % 250 mL IVPB        100 mg 125 mL/hr over 120 Minutes Intravenous Every 12 hours 03/15/23 0055 03/21/23 1259   03/14/23 2030  doxycycline (VIBRAMYCIN) 100 mg in dextrose 5 % 250 mL IVPB  Status:  Discontinued        100 mg 125 mL/hr over 120 Minutes Intravenous Every 12 hours 03/14/23 2020 03/15/23 0055   03/14/23 1945  cefTRIAXone (ROCEPHIN) 2 g in sodium chloride 0.9 % 100 mL IVPB        2 g 200 mL/hr over 30 Minutes Intravenous  Once 03/14/23 1943 03/15/23 0044   03/14/23 1945  azithromycin (ZITHROMAX) 500 mg in dextrose 5 % 250 mL IVPB        500 mg 250 mL/hr over 60 Minutes Intravenous  Once 03/14/23 1943 03/15/23 0159       Subjective: Patient was seen and examined at bedside.  Overnight events noted.   Patient remained on BiPAP states she is feeling better.  She had hemodialysis last night.  Objective: Vitals:   03/15/23 0955 03/15/23 1000 03/15/23 1103 03/15/23 1115  BP:  (!) 149/73  132/60  Pulse:  86  78  Resp:  (!) 21  (!) 33  Temp: (!) 100.8 F (38.2 C)  (!) 100.6 F (38.1 C)   TempSrc: Axillary  Oral   SpO2:  100%  100%  Weight:      Height:        Intake/Output Summary (Last 24 hours) at 03/15/2023 1142 Last data filed at 03/15/2023 0409 Gross per 24 hour  Intake 1090 ml  Output --  Net 1090 ml   Filed Weights   03/14/23 1613  Weight: 70.3 kg    Examination:  General exam: Appears calm and comfortable, deconditioned not in any distress on BiPAP Respiratory system: Decreased breath sounds. Respiratory effort normal. RR 13 Cardiovascular system: S1 & S2 heard, RRR. No JVD, murmurs, rubs, gallops or clicks.  Gastrointestinal system: Abdomen is nondistended, soft and nontender. Normal bowel sounds  heard. Central nervous system: Alert and oriented X 3. No focal neurological deficits. Extremities: Edema 3+, no cyanosis, no clubbing Skin: No rashes, lesions or ulcers Psychiatry: Judgement and insight appear normal. Mood & affect appropriate.     Data Reviewed: I have personally reviewed following labs and imaging studies  CBC: Recent Labs  Lab 03/14/23 1850 03/14/23 1853 03/14/23 2353 03/15/23 0638 03/15/23 0752  WBC 10.2  --  9.2  --  8.8  NEUTROABS 8.2*  --   --   --   --   HGB 11.1* 11.9* 11.1* 10.9* 11.1*  HCT 35.0* 35.0* 33.9* 32.0* 34.0*  MCV 105.7*  --  106.3*  --  107.3*  PLT 102*  --  108*  --  126*   Basic Metabolic Panel: Recent Labs  Lab 03/14/23 1650 03/14/23 1807 03/14/23 1853 03/15/23 0638 03/15/23 0752  NA 136 140 138 134* 138  K 6.0* 6.0* 5.2* 4.9 5.5*  CL 106 103  --   --  96*  CO2  --  15*  --   --  25  GLUCOSE 159* 118*  --   --  83  BUN 69* 47*  --   --  55*  CREATININE 10.90* 10.57*  --   --  11.04*  CALCIUM  --  9.6  --   --  9.6  PHOS  --   --   --   --  5.0*   GFR: Estimated Creatinine Clearance: 3.5 mL/min (A) (by C-G formula based on SCr of 11.04 mg/dL (H)). Liver Function Tests: Recent Labs  Lab 03/14/23 1807 03/15/23 0752  AST 52* 47*  ALT 30 33  ALKPHOS 72 63  BILITOT 1.3* 1.4*  PROT 6.8 6.4*  ALBUMIN 3.4* 3.0*   No results for input(s): "LIPASE", "AMYLASE" in the last 168 hours. No results for input(s): "AMMONIA" in the last 168 hours. Coagulation Profile: No results for input(s): "INR", "PROTIME" in the last 168 hours. Cardiac Enzymes: No results for input(s): "CKTOTAL", "CKMB", "CKMBINDEX", "TROPONINI" in the last 168 hours. BNP (last 3 results) No results for input(s): "PROBNP" in the last 8760 hours. HbA1C: No results for input(s): "HGBA1C" in the last 72 hours. CBG: Recent Labs  Lab 03/15/23 0018  GLUCAP 104*   Lipid Profile: No results for input(s): "CHOL", "HDL", "LDLCALC", "TRIG", "CHOLHDL",  "LDLDIRECT" in the last 72 hours. Thyroid Function Tests: No results for input(s): "TSH", "T4TOTAL", "FREET4", "T3FREE", "THYROIDAB" in the last 72 hours. Anemia Panel: Recent Labs    03/15/23 0530 03/15/23 0752  VITAMINB12  --  320  FOLATE 9.6  --   FERRITIN  --  1,470*  TIBC  --  196*  IRON  --  32  RETICCTPCT 3.8*  --    Sepsis Labs: Recent Labs  Lab 03/15/23 0752  PROCALCITON 1.09    Recent Results (from the past 240 hour(s))  SARS Coronavirus 2 by RT PCR (hospital order, performed in [facility] hospital lab) *cepheid single result test* Anterior Nasal Swab     Status: None   Collection Time: 03/14/23  4:29 PM   Specimen: Anterior Nasal Swab  Result Value Ref Range Status   SARS Coronavirus 2 by RT PCR NEGATIVE NEGATIVE Final    Comment: Performed at [facility], [address] [place], [place] [address]  Culture, blood (routine x 2) Call MD if unable to obtain prior to antibiotics being given     Status: None (Preliminary result)   Collection Time: 03/14/23 11:53 PM   Specimen: BLOOD RIGHT HAND  Result Value Ref Range Status   Specimen Description BLOOD RIGHT HAND  Final   Special Requests   Final    BOTTLES DRAWN AEROBIC AND ANAEROBIC Blood Culture adequate volume   Culture   Final    NO GROWTH < 12 HOURS Performed at [facility], [address] [place], [place] [address]    Report Status PENDING  Incomplete  Culture, blood (routine x 2) Call MD if unable to obtain prior to antibiotics being given     Status: None (Preliminary result)   Collection Time: 03/14/23 11:55 PM   Specimen: BLOOD RIGHT WRIST  Result Value Ref Range Status   Specimen Description BLOOD RIGHT WRIST  Final   Special Requests   Final    BOTTLES DRAWN AEROBIC AND ANAEROBIC Blood Culture results may not be optimal due to an excessive volume of blood received in culture bottles   Culture   Final    NO GROWTH < 12 HOURS Performed at [facility], [address]  [place], [place] [address]    Report Status PENDING  Incomplete  Respiratory (~20 pathogens) panel by PCR     Status: None   Collection Time: 03/14/23 11:55 PM   Specimen: Nasopharyngeal Swab; Respiratory  Result Value Ref Range Status   Adenovirus NOT DETECTED NOT DETECTED Final   Coronavirus 229E NOT DETECTED NOT DETECTED Final    Comment: (NOTE) The Coronavirus on the Respiratory Panel, DOES NOT test for the novel  Coronavirus (2019 nCoV)    Coronavirus HKU1 NOT DETECTED NOT DETECTED Final   Coronavirus NL63 NOT DETECTED NOT DETECTED Final   Coronavirus OC43 NOT DETECTED NOT DETECTED Final   Metapneumovirus NOT DETECTED NOT DETECTED Final   Rhinovirus / Enterovirus NOT DETECTED NOT DETECTED Final   Influenza A NOT DETECTED NOT DETECTED Final   Influenza B NOT DETECTED NOT DETECTED Final   Parainfluenza Virus 1 NOT DETECTED NOT DETECTED Final   Parainfluenza Virus 2 NOT DETECTED NOT DETECTED Final   Parainfluenza Virus 3 NOT DETECTED NOT DETECTED Final   Parainfluenza Virus 4 NOT DETECTED NOT DETECTED Final   Respiratory Syncytial Virus NOT DETECTED NOT DETECTED Final   Bordetella pertussis NOT DETECTED NOT DETECTED Final   Bordetella Parapertussis NOT DETECTED NOT DETECTED Final   Chlamydophila pneumoniae NOT DETECTED NOT DETECTED Final   Mycoplasma pneumoniae NOT DETECTED NOT DETECTED Final    Comment: Performed at [facility], [address], [place], [place] [address]    Radiology Studies: DG Chest Portable 1 View  Result Date: 03/14/2023 CLINICAL DATA:  Shortness of breath EXAM: PORTABLE CHEST 1 VIEW COMPARISON:  05/05/2022 FINDINGS: The cardiomediastinal silhouette is obscured. Aortic atherosclerotic calcification. Airspace opacities throughout the right lung and in the left mid and lower lung. Suspected bilateral pleural effusions. No pneumothorax. IMPRESSION: 1. Bilateral airspace opacities, right greater than left, concerning for multifocal pneumonia. Electronically  Signed   By: Minerva Fester M.D.   On: 03/14/2023 19:38    Scheduled Meds:  Chlorhexidine Gluconate Cloth  6 each Topical Q0600   guaiFENesin  600 mg Oral BID   heparin  5,000 Units Subcutaneous Q8H   sodium chloride flush  3 mL Intravenous Q12H   sodium chloride flush  3 mL Intravenous Q12H   sodium zirconium cyclosilicate  10 g Oral Nightly   Continuous Infusions:  sodium chloride     cefTRIAXone (ROCEPHIN)  IV     doxycycline (VIBRAMYCIN) IV Stopped (03/15/23 0409)     LOS: 1 day    Time spent: 50 mins    Willeen Niece, MD [facility]   If 7PM-7AM, please contact night-coverage

## 2023-03-15 NOTE — Progress Notes (Signed)
Patient is off BiPAP at this time.

## 2023-03-15 NOTE — Progress Notes (Signed)
[facility] Progress Note  Subjective: seen in ED. Has not had HD yet, on bipap and comfortable.   Vitals:   03/15/23 0915 03/15/23 0930 03/15/23 0945 03/15/23 0955  BP: 102/71 127/66 (!) 124/58   Pulse: 73 74 70   Resp: (!) 21 (!) 27 (!) 22   Temp:    (!) 100.8 F (38.2 C)  TempSrc:    Axillary  SpO2: 100% 100% 100%   Weight:      Height:        Exam:  Elderly AAF on bipap, stable, able to talk  Chest - bilat basilar rales, no wheezing  Cor - RRR no RG  Abd - soft ntnd no ascites  Ext - 1-2+ bilat pretib edema  Neuro - alert and responsive   LUA AVF+bruit     OP HD: MWF [facility]  3.5h  350/1.5   70.7kg   2/2 bath  AVF  Heparin 2200 - last OP HD 11/08, getting to dry wt - rocaltrol 1.25 micrograms  - mircera 120 mcg IV q 2, last 11/1, due 11/15    Assessment/ Plan: AHRF: pulmonary edema vs possible multifocal PNA. Getting IV abx. Stable on bipap at this time. She is next in line for HD upstairs.  ESRD - missed Monday HD. HD today as above.  Hyperkalemia - mild, temporizing measures ordered by admitting team Anemia eskd - at baseline, not due for ESA until 11/15 BP/Vol - Bps stable, vol overloaded w/ bilat LE edema on exam. Probably is losing body wt. Her dry wt has been reduced recently at OP unit.  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 Check phos.      Vinson Moselle MD  [facility] 03/15/2023, 10:15 AM  Recent Labs  Lab 03/14/23 1807 03/14/23 1850 03/15/23 0638 03/15/23 0752  HGB  --    < > 10.9* 11.1*  ALBUMIN 3.4*  --   --  3.0*  CALCIUM 9.6  --   --  9.6  CREATININE 10.57*  --   --  11.04*  K 6.0*   < > 4.9 5.5*   < > = values in this interval not displayed.   Recent Labs  Lab 03/15/23 0752  IRON 32  TIBC 196*  FERRITIN 1,470*   Inpatient medications:  Chlorhexidine Gluconate Cloth  6 each Topical Q0600   guaiFENesin  600 mg Oral BID   heparin  5,000 Units Subcutaneous Q8H   sodium chloride flush  3 mL Intravenous Q12H   sodium chloride flush  3 mL  Intravenous Q12H   sodium zirconium cyclosilicate  10 g Oral Nightly    sodium chloride     cefTRIAXone (ROCEPHIN)  IV     doxycycline (VIBRAMYCIN) IV Stopped (03/15/23 0409)   sodium chloride, acetaminophen **OR** acetaminophen, hydrALAZINE, ondansetron **OR** ondansetron (ZOFRAN) IV, senna-docusate, sodium chloride flush

## 2023-03-16 DIAGNOSIS — N186 End stage renal disease: Secondary | ICD-10-CM | POA: Diagnosis not present

## 2023-03-16 DIAGNOSIS — J9601 Acute respiratory failure with hypoxia: Secondary | ICD-10-CM | POA: Diagnosis not present

## 2023-03-16 DIAGNOSIS — J189 Pneumonia, unspecified organism: Secondary | ICD-10-CM | POA: Diagnosis not present

## 2023-03-16 DIAGNOSIS — Z992 Dependence on renal dialysis: Secondary | ICD-10-CM | POA: Diagnosis not present

## 2023-03-16 LAB — COMPREHENSIVE METABOLIC PANEL
ALT: 29 U/L (ref 0–44)
AST: 39 U/L (ref 15–41)
Albumin: 2.5 g/dL — ABNORMAL LOW (ref 3.5–5.0)
Alkaline Phosphatase: 52 U/L (ref 38–126)
Anion gap: 13 (ref 5–15)
BUN: 29 mg/dL — ABNORMAL HIGH (ref 8–23)
CO2: 27 mmol/L (ref 22–32)
Calcium: 8.2 mg/dL — ABNORMAL LOW (ref 8.9–10.3)
Chloride: 94 mmol/L — ABNORMAL LOW (ref 98–111)
Creatinine, Ser: 7.28 mg/dL — ABNORMAL HIGH (ref 0.44–1.00)
GFR, Estimated: 5 mL/min — ABNORMAL LOW (ref >60)
Glucose, Bld: 132 mg/dL — ABNORMAL HIGH (ref 70–99)
Potassium: 3.9 mmol/L (ref 3.5–5.1)
Sodium: 134 mmol/L — ABNORMAL LOW (ref 135–145)
Total Bilirubin: 1.3 mg/dL — ABNORMAL HIGH (ref <1.2)
Total Protein: 5.3 g/dL — ABNORMAL LOW (ref 6.5–8.1)

## 2023-03-16 LAB — CBC
HCT: 27.5 % — ABNORMAL LOW (ref 36.0–46.0)
Hemoglobin: 9.1 g/dL — ABNORMAL LOW (ref 12.0–15.0)
MCH: 34 pg (ref 26.0–34.0)
MCHC: 33.1 g/dL (ref 30.0–36.0)
MCV: 102.6 fL — ABNORMAL HIGH (ref 80.0–100.0)
Platelets: 84 10*3/uL — ABNORMAL LOW (ref 150–400)
RBC: 2.68 MIL/uL — ABNORMAL LOW (ref 3.87–5.11)
RDW: 14.5 % (ref 11.5–15.5)
WBC: 7.8 10*3/uL (ref 4.0–10.5)
nRBC: 0 % (ref 0.0–0.2)

## 2023-03-16 LAB — LACTIC ACID, PLASMA: Lactic Acid, Venous: 1.6 mmol/L (ref 0.5–1.9)

## 2023-03-16 LAB — HEPATITIS B SURFACE ANTIBODY, QUANTITATIVE: Hep B S AB Quant (Post): 29.9 m[IU]/mL

## 2023-03-16 LAB — PHOSPHORUS: Phosphorus: 4.6 mg/dL (ref 2.5–4.6)

## 2023-03-16 LAB — MAGNESIUM: Magnesium: 1.6 mg/dL — ABNORMAL LOW (ref 1.7–2.4)

## 2023-03-16 MED ORDER — MIDODRINE HCL 5 MG PO TABS: 5.0000 mg | ORAL_TABLET | Freq: Three times a day (TID) | ORAL | Status: DC

## 2023-03-16 MED ORDER — MIDODRINE HCL 5 MG PO TABS
5.0000 mg | ORAL_TABLET | ORAL | Status: AC
  Administered 2023-03-16: 5 mg via ORAL
  Filled 2023-03-16: qty 1

## 2023-03-16 MED ORDER — MIDODRINE HCL 5 MG PO TABS
5.0000 mg | ORAL_TABLET | Freq: Three times a day (TID) | ORAL | Status: DC
  Administered 2023-03-16 (×2): 5 mg via ORAL
  Filled 2023-03-16 (×2): qty 1

## 2023-03-16 MED ORDER — CHLORHEXIDINE GLUCONATE CLOTH 2 % EX PADS
6.0000 | MEDICATED_PAD | Freq: Every day | CUTANEOUS | Status: DC
  Administered 2023-03-17: 6 via TOPICAL

## 2023-03-16 MED ORDER — CYANOCOBALAMIN 1000 MCG/ML IJ SOLN
1000.0000 ug | Freq: Every day | INTRAMUSCULAR | Status: DC
  Administered 2023-03-16 – 2023-03-19 (×4): 1000 ug via SUBCUTANEOUS
  Filled 2023-03-16 (×4): qty 1

## 2023-03-16 MED ORDER — MIDODRINE HCL 5 MG PO TABS
10.0000 mg | ORAL_TABLET | Freq: Three times a day (TID) | ORAL | Status: DC
  Administered 2023-03-16 – 2023-03-17 (×4): 10 mg via ORAL
  Filled 2023-03-16 (×4): qty 2

## 2023-03-16 MED ORDER — MAGNESIUM SULFATE IN D5W 1-5 GM/100ML-% IV SOLN
1.0000 g | Freq: Once | INTRAVENOUS | Status: AC
  Administered 2023-03-16: 1 g via INTRAVENOUS
  Filled 2023-03-16: qty 100

## 2023-03-16 MED ORDER — CYANOCOBALAMIN 1000 MCG/ML IJ SOLN: 1000.0000 ug | INTRAMUSCULAR | Status: DC

## 2023-03-16 NOTE — Progress Notes (Signed)
PROGRESS NOTE    [name]  XBJ:[id number] DOB: March 01, 1941 DOA: 03/14/2023 PCP: Gean Birchwood, [facility]   Brief Narrative:  This 82 y.o. female with medical history significant of ESRD on HD MWF schedule, hypertension, anemia of chronic disease, endometrial cancer status post abdominal hysterectomy, morbid obesity, secondary hyperparathyroidism presented to ED for evaluation for nausea, chest pain and shortness of breath. She was found to have O2 sat 60% room air and improved to 85% with CPAP. Patient reports she missed her dialysis today due to not being able to go, she also reports feeling better with BiPAP. She was subsequently transitioned to 5 L [place], tolerating well. Chest x-ray showing bilateral airspace disease concerning for multifocal pneumonia,  Patient was empirically started on ceftriaxone and Zithromax.  Nephrology was consulted and patient underwent hemodialysis.  Patient was admitted for acute hypoxic respiratory failure in the setting of volume overload from missing hemodialysis and multifocal pneumonia.  Assessment & Plan:   Principal Problem:   Acute hypoxic respiratory failure (HCC) Active Problems:   ESRD on dialysis [facility])   Multifocal pneumonia   Anemia of chronic disease   Hyperkalemia   Essential hypertension   Secondary hyperparathyroidism (HCC)  Acute hypoxic respiratory failure: Multifactorial Volume overload from missing hemodialysis: Multifocal pneumonia: EMS found SpO2 60% on room air requiring BiPAP this has improved, improved oxygen requirement, she is currently on 4 L nasal cannula Continue on BiPAP as needed and at night.  Wean oxygen as needed. ABG showed pH 7.3, pCO2 40, pO2 84 and bicarb 24 while on BiPAP. Chest x-ray showed bilateral airspace opacity concerning for multifocal pneumonia.  Continue ceftriaxone and Zithromax for multifocal pneumonia. Nephrology consulted.  Patient underwent hemodialysis last night Continue pulmonary  toiletry, aspiration precaution and Xopenex as needed for wheezing shortness of breath. Continue cardiac monitoring Follow-up blood and sputum culture.   Volume overload due to missing hemodialysis. ESRD on hemodialysis. Chest x-ray showed pleural effusion and physical exam showed 2+ pitting edema in bilateral lower extremities. HD per renal  Hypotension -Lactic acid within normal limit, will increase her midodrine.   Hyperkalemia: Patient was treated with calcium gluconate, insulin and dextrose. Lokelma given. Manage with hemodialysis.  Essential hypertension: Please see above discussion under hypotension.   Anemia of chronic disease: Microcytic, following anemia panel B12 is borderline, so I will start on IM supplements.   Secondary hyperparathyroidism: Patient reports not taking PhosLo and Rocaltrol anymore.     Morbid obesity -BMI 29.  Counseled patient to compliant with dialysis and continue renal diet.   Encourage patient try to be mobile as much as she can.   Age-related debility: At baseline patient reported using his scooter outside of the house for movement however at home she tries to ambulate without any walker and rollator.  Physical exam showing morbid obesity and concern for high risk of fall. Consulting in patient PT and OT evaluation of balance.    DVT prophylaxis:Heparin sq Code Status: Full code Family Communication: No family at bed side Disposition Plan:   Status is: Inpatient Remains inpatient appropriate because: Admitted for acute hypoxic respiratory failure due to fluid overload in the setting of missed hemodialysis.  Patient also found to have multifocal pneumonia, started on amp antibiotics.  Nephrology is consulted    Consultants:  Nephrology  Procedures: Hemodialysis  Antimicrobials:  Anti-infectives (From admission, onward)    Start     Dose/Rate Route Frequency Ordered Stop   03/15/23 2200  cefTRIAXone (ROCEPHIN) 2 g in sodium  chloride 0.9 % 100 mL IVPB        2 g 200 mL/hr over 30 Minutes Intravenous Every 24 hours 03/14/23 2329 03/21/23 2159   03/15/23 1200  cefTRIAXone (ROCEPHIN) 1 g in sodium chloride 0.9 % 100 mL IVPB  Status:  Discontinued        1 g 200 mL/hr over 30 Minutes Intravenous Every 24 hours 03/14/23 2020 03/14/23 2328   03/15/23 0100  doxycycline (VIBRAMYCIN) 100 mg in dextrose 5 % 250 mL IVPB        100 mg 125 mL/hr over 120 Minutes Intravenous Every 12 hours 03/15/23 0055 03/21/23 1259   03/14/23 2030  doxycycline (VIBRAMYCIN) 100 mg in dextrose 5 % 250 mL IVPB  Status:  Discontinued        100 mg 125 mL/hr over 120 Minutes Intravenous Every 12 hours 03/14/23 2020 03/15/23 0055   03/14/23 1945  cefTRIAXone (ROCEPHIN) 2 g in sodium chloride 0.9 % 100 mL IVPB        2 g 200 mL/hr over 30 Minutes Intravenous  Once 03/14/23 1943 03/15/23 0044   03/14/23 1945  azithromycin (ZITHROMAX) 500 mg in dextrose 5 % 250 mL IVPB        500 mg 250 mL/hr over 60 Minutes Intravenous  Once 03/14/23 1943 03/15/23 0159       Subjective: Patient reports she is feeling better, dyspnea has improved, denies cough today.  She had hypotensive overnight, started on midodrine.  Objective: Vitals:   03/16/23 0400 03/16/23 0500 03/16/23 0600 03/16/23 0733  BP: (!) 98/47 (!) 91/43 (!) 89/46   Pulse: 64     Resp: 17 16 19 18   Temp: 98.5 F (36.9 C)     TempSrc: Oral     SpO2: 100% 100% 100% 100%  Weight:  74.7 kg    Height:        Intake/Output Summary (Last 24 hours) at 03/16/2023 1304 Last data filed at 03/16/2023 0708 Gross per 24 hour  Intake 100 ml  Output 2500 ml  Net -2400 ml   Filed Weights   03/15/23 1721 03/16/23 0500  Weight: 77 kg 74.7 kg    Examination:  Awake Alert, pleasant, extremely frail, on 4 L nasal cannula Symmetrical Chest wall movement, Good air movement bilaterally, CTAB RRR,No Gallops,Rubs or new Murmurs, No Parasternal Heave +ve B.Sounds, Abd Soft, No tenderness, No  rebound - guarding or rigidity. No Cyanosis, Clubbing or edema, No new Rash or bruise      Data Reviewed: I have personally reviewed following labs and imaging studies  CBC: Recent Labs  Lab 03/14/23 1850 03/14/23 1853 03/14/23 2353 03/15/23 0638 03/15/23 0752 03/16/23 0255  WBC 10.2  --  9.2  --  8.8 7.8  NEUTROABS 8.2*  --   --   --   --   --   HGB 11.1* 11.9* 11.1* 10.9* 11.1* 9.1*  HCT 35.0* 35.0* 33.9* 32.0* 34.0* 27.5*  MCV 105.7*  --  106.3*  --  107.3* 102.6*  PLT 102*  --  108*  --  126* 84*   Basic Metabolic Panel: Recent Labs  Lab 03/14/23 1650 03/14/23 1807 03/14/23 1853 03/15/23 0638 03/15/23 0752 03/16/23 0255  NA 136 140 138 134* 138 134*  K 6.0* 6.0* 5.2* 4.9 5.5* 3.9  CL 106 103  --   --  96* 94*  CO2  --  15*  --   --  25 27  GLUCOSE 159* 118*  --   --  83 132*  BUN 69* 47*  --   --  55* 29*  CREATININE 10.90* 10.57*  --   --  11.04* 7.28*  CALCIUM  --  9.6  --   --  9.6 8.2*  MG  --   --   --   --   --  1.6*  PHOS  --   --   --   --  5.0* 4.6   GFR: Estimated Creatinine Clearance: 5.4 mL/min (A) (by C-G formula based on SCr of 7.28 mg/dL (H)). Liver Function Tests: Recent Labs  Lab 03/14/23 1807 03/15/23 0752 03/16/23 0255  AST 52* 47* 39  ALT 30 33 29  ALKPHOS 72 63 52  BILITOT 1.3* 1.4* 1.3*  PROT 6.8 6.4* 5.3*  ALBUMIN 3.4* 3.0* 2.5*   No results for input(s): "LIPASE", "AMYLASE" in the last 168 hours. No results for input(s): "AMMONIA" in the last 168 hours. Coagulation Profile: No results for input(s): "INR", "PROTIME" in the last 168 hours. Cardiac Enzymes: No results for input(s): "CKTOTAL", "CKMB", "CKMBINDEX", "TROPONINI" in the last 168 hours. BNP (last 3 results) No results for input(s): "PROBNP" in the last 8760 hours. HbA1C: No results for input(s): "HGBA1C" in the last 72 hours. CBG: Recent Labs  Lab 03/15/23 0018  GLUCAP 104*   Lipid Profile: No results for input(s): "CHOL", "HDL", "LDLCALC", "TRIG",  "CHOLHDL", "LDLDIRECT" in the last 72 hours. Thyroid Function Tests: No results for input(s): "TSH", "T4TOTAL", "FREET4", "T3FREE", "THYROIDAB" in the last 72 hours. Anemia Panel: Recent Labs    03/15/23 0530 03/15/23 0752  VITAMINB12  --  320  FOLATE 9.6  --   FERRITIN  --  1,470*  TIBC  --  196*  IRON  --  32  RETICCTPCT 3.8*  --    Sepsis Labs: Recent Labs  Lab 03/15/23 0752 03/16/23 0255  PROCALCITON 1.09  --   LATICACIDVEN  --  1.6    Recent Results (from the past 240 hour(s))  SARS Coronavirus 2 by RT PCR (hospital order, performed in [facility] hospital lab) *cepheid single result test* Anterior Nasal Swab     Status: None   Collection Time: 03/14/23  4:29 PM   Specimen: Anterior Nasal Swab  Result Value Ref Range Status   SARS Coronavirus 2 by RT PCR NEGATIVE NEGATIVE Final    Comment: Performed at [facility], [address] [place], [place] [address]  Culture, blood (routine x 2) Call MD if unable to obtain prior to antibiotics being given     Status: None (Preliminary result)   Collection Time: 03/14/23 11:53 PM   Specimen: BLOOD RIGHT HAND  Result Value Ref Range Status   Specimen Description BLOOD RIGHT HAND  Final   Special Requests   Final    BOTTLES DRAWN AEROBIC AND ANAEROBIC Blood Culture adequate volume   Culture   Final    NO GROWTH 1 DAY Performed at [facility], [address] [place], [place] [address]    Report Status PENDING  Incomplete  Culture, blood (routine x 2) Call MD if unable to obtain prior to antibiotics being given     Status: None (Preliminary result)   Collection Time: 03/14/23 11:55 PM   Specimen: BLOOD RIGHT WRIST  Result Value Ref Range Status   Specimen Description BLOOD RIGHT WRIST  Final   Special Requests   Final    BOTTLES DRAWN AEROBIC AND ANAEROBIC Blood Culture results may not be optimal due to an  excessive volume of blood received in culture bottles   Culture   Final    NO GROWTH 1 DAY Performed at  [facility], [address], [place], [place] [address]    Report Status PENDING  Incomplete  Respiratory (~20 pathogens) panel by PCR     Status: None   Collection Time: 03/14/23 11:55 PM   Specimen: Nasopharyngeal Swab; Respiratory  Result Value Ref Range Status   Adenovirus NOT DETECTED NOT DETECTED Final   Coronavirus 229E NOT DETECTED NOT DETECTED Final    Comment: (NOTE) The Coronavirus on the Respiratory Panel, DOES NOT test for the novel  Coronavirus (2019 nCoV)    Coronavirus HKU1 NOT DETECTED NOT DETECTED Final   Coronavirus NL63 NOT DETECTED NOT DETECTED Final   Coronavirus OC43 NOT DETECTED NOT DETECTED Final   Metapneumovirus NOT DETECTED NOT DETECTED Final   Rhinovirus / Enterovirus NOT DETECTED NOT DETECTED Final   Influenza A NOT DETECTED NOT DETECTED Final   Influenza B NOT DETECTED NOT DETECTED Final   Parainfluenza Virus 1 NOT DETECTED NOT DETECTED Final   Parainfluenza Virus 2 NOT DETECTED NOT DETECTED Final   Parainfluenza Virus 3 NOT DETECTED NOT DETECTED Final   Parainfluenza Virus 4 NOT DETECTED NOT DETECTED Final   Respiratory Syncytial Virus NOT DETECTED NOT DETECTED Final   Bordetella pertussis NOT DETECTED NOT DETECTED Final   Bordetella Parapertussis NOT DETECTED NOT DETECTED Final   Chlamydophila pneumoniae NOT DETECTED NOT DETECTED Final   Mycoplasma pneumoniae NOT DETECTED NOT DETECTED Final    Comment: Performed at [facility], [address], [place], [place] [address]  MRSA Next Gen by PCR, Nasal     Status: None   Collection Time: 03/15/23  6:00 PM   Specimen: Nasal Mucosa; Nasal Swab  Result Value Ref Range Status   MRSA by PCR Next Gen NOT DETECTED NOT DETECTED Final    Comment: (NOTE) The GeneXpert MRSA Assay (FDA approved for NASAL specimens only), is one component of a comprehensive MRSA colonization surveillance program. It is not intended to diagnose MRSA infection nor to guide or monitor treatment for MRSA  infections. Test performance is not FDA approved in patients less than 73 years old. Performed at [facility], [address], [place], [place] [address]   Expectorated Sputum Assessment w Gram Stain, Rflx to Resp Cult     Status: None (Preliminary result)   Collection Time: 03/16/23  2:08 AM   Specimen: Expectorated Sputum  Result Value Ref Range Status   Specimen Description EXPECTORATED SPUTUM  Final   Special Requests Immunocompromised  Final   Sputum evaluation   Final    Sputum specimen not acceptable for testing.  Please recollect.   Performed at [facility], [address] [place], [place] [address]    Report Status PENDING  Incomplete    Radiology Studies: DG Chest Portable 1 View  Result Date: 03/14/2023 CLINICAL DATA:  Shortness of breath EXAM: PORTABLE CHEST 1 VIEW COMPARISON:  05/05/2022 FINDINGS: The cardiomediastinal silhouette is obscured. Aortic atherosclerotic calcification. Airspace opacities throughout the right lung and in the left mid and lower lung. Suspected bilateral pleural effusions. No pneumothorax. IMPRESSION: 1. Bilateral airspace opacities, right greater than left, concerning for multifocal pneumonia. Electronically Signed   By: Minerva Fester M.D.   On: 03/14/2023 19:38    Scheduled Meds:  Chlorhexidine Gluconate Cloth  6 each Topical Q0600   guaiFENesin  600 mg Oral BID   heparin  5,000 Units Subcutaneous Q8H   midodrine  5 mg Oral TID WC   sodium chloride flush  3 mL Intravenous Q12H   sodium chloride flush  3 mL Intravenous Q12H   sodium zirconium cyclosilicate  10 g Oral Nightly   Continuous Infusions:  cefTRIAXone (ROCEPHIN)  IV Stopped (03/15/23 2326)   doxycycline (VIBRAMYCIN) IV 100 mg (03/16/23 1227)     LOS: 2 days     Huey Bienenstock, MD [facility]   If 7PM-7AM, please contact night-coverage

## 2023-03-16 NOTE — Progress Notes (Signed)
Physical Therapy Treatment Patient Details Name: [name] MRN: [id number] DOB: 09-07-1940 Today's Date: 03/16/2023   History of Present Illness Patient is an 82 y/o female admitted 03/14/23 due to respiratory failure needing bipap.  States she missed HD as she was sick at home.  Found to have multifocal pneumonia. PMH positive for ESRD on HD, HTN, anemia, endometrial cancer s/p TAH, morbid obesity, and secondary hyperparathyroidism.    PT Comments  Pt with fair tolerance to treatment today. Pt initially noted with low BP while laying in bed however BP rose with movement. BP: 90/42 supine, BP: 103/48 seated, BP: 110/66 seated on BSC. Pt today able to transfer to [facility] with Min A as pt was noted to be a bit wobbly. Given that pt lives alone and was very independent PTA recommend SNF upon DC. PT will continue to follow.     If plan is discharge home, recommend the following: A little help with walking and/or transfers;Assistance with cooking/housework   Can travel by private vehicle     No  Equipment Recommendations  Other (comment) (Per accepting facility)    Recommendations for Other Services       Precautions / Restrictions Precautions Precautions: Fall Precaution Comments: Watch BP Restrictions Weight Bearing Restrictions: No     Mobility  Bed Mobility Overal bed mobility: Needs Assistance Bed Mobility: Supine to Sit     Supine to sit: Contact guard     General bed mobility comments: Increased time    Transfers Overall transfer level: Needs assistance Equipment used: None, Rolling walker (2 wheels) Transfers: Sit to/from Stand, Bed to chair/wheelchair/BSC Sit to Stand: Min assist   Step pivot transfers: Min assist       General transfer comment: Pt noted to be quite unsteady when standing and transferring requiring Min A at times to correct. Pt also noted to try and sit prematurely on BSC.    Ambulation/Gait               General Gait Details:  deferred for safety   Stairs             Wheelchair Mobility     Tilt Bed    Modified Rankin (Stroke Patients Only)       Balance Overall balance assessment: Needs assistance Sitting-balance support: Bilateral upper extremity supported, Feet supported Sitting balance-Leahy Scale: Good Sitting balance - Comments: EOB   Standing balance support: No upper extremity supported, Bilateral upper extremity supported, Reliant on assistive device for balance Standing balance-Leahy Scale: Poor Standing balance comment: Reliant on external support                            Cognition Arousal: Alert Behavior During Therapy: WFL for tasks assessed/performed Overall Cognitive Status: Within Functional Limits for tasks assessed                                          Exercises      General Comments General comments (skin integrity, edema, etc.): BP: 90/42 supine, BP: 103/48 seated, BP: 110/66 seated on BSC.      Pertinent Vitals/Pain Pain Assessment Pain Assessment: No/denies pain    Home Living Family/patient expects to be discharged to:: Private residence Living Arrangements: Alone   Type of Home: Apartment Home Access: Stairs to enter   [employer] of Steps: 1  Home Layout: One level Home Equipment: None Additional Comments: walks unaided at home. 1 fall in the last year after slipping    Prior Function            PT Goals (current goals can now be found in the care plan section) Progress towards PT goals: Progressing toward goals    Frequency    Min 1X/week      PT Plan      Co-evaluation              AM-PAC PT "6 Clicks" Mobility   Outcome Measure  Help needed turning from your back to your side while in a flat bed without using bedrails?: A Little Help needed moving from lying on your back to sitting on the side of a flat bed without using bedrails?: A Little Help needed moving to and from a  bed to a chair (including a wheelchair)?: A Little Help needed standing up from a chair using your arms (e.g., wheelchair or bedside chair)?: A Little Help needed to walk in hospital room?: A Lot Help needed climbing 3-5 steps with a railing? : Total 6 Click Score: 15    End of Session Equipment Utilized During Treatment: Oxygen;Gait belt Activity Tolerance: Patient tolerated treatment well;Patient limited by fatigue Patient left: in bed;with call bell/phone within reach;Other (comment) (Handoff to OT) Nurse Communication: Mobility status PT Visit Diagnosis: Other abnormalities of gait and mobility (R26.89)     Time: 8657-8469 PT Time Calculation (min) (ACUTE ONLY): 25 min  Charges:    $Therapeutic Activity: 23-37 mins PT General Charges $$ ACUTE PT VISIT: 1 Visit                     Shela Nevin, PT, DPT Acute Rehab Services [phone]    Gladys Damme 03/16/2023, 3:56 PM

## 2023-03-16 NOTE — Progress Notes (Signed)
   03/16/23 2141  BiPAP/CPAP/SIPAP  Reason BIPAP/CPAP not in use Other(comment) (pt states does not wera cpap at home. no distress noted. biopap not needed at this time)  BiPAP/CPAP /SiPAP Vitals  Resp (!) 27  BP (!) 111/57  SpO2 97 %  MEWS Score/Color  MEWS Score 2  MEWS Score Color Yellow

## 2023-03-16 NOTE — Progress Notes (Signed)
 [facility] Progress Note  Subjective: seen in room. 2.5 L off w/ HD last night  Vitals:   03/16/23 0400 03/16/23 0500 03/16/23 0600 03/16/23 0733  BP: (!) 98/47 (!) 91/43 (!) 89/46   Pulse: 64     Resp: 17 16 19 18   Temp: 98.5 F (36.9 C)     TempSrc: Oral     SpO2: 100% 100% 100% 100%  Weight:  74.7 kg    Height:        Exam:  Elderly AAF on 3 L [place]  Chest - bilat basilar rales, no wheezing  Cor - RRR no RG  Abd - soft ntnd no ascites  Ext - 1-2+ bilat pretib edema  Neuro - alert and responsive   LUA AVF+bruit     OP HD: MWF [facility]  3.5h  350/1.5   70.7kg   2/2 bath  AVF  Heparin 2200 - last OP HD 11/08, getting to dry wt - rocaltrol 1.25 micrograms  - mircera 120 mcg IV q 2, last 11/1, due 11/15    Assessment/ Plan: AHRF: pulmonary edema vs possible multifocal PNA. Getting IV abx. Off bipap sp HD yesterday w/ 2.5 L UF.  BP's were quite low overnight in the 70s- 90s, although they were stable on HD yest.  ESRD - missed Monday HD. HD Tuesday as above. HD tomorrow off schedule.  Hyperkalemia - mild, temporizing measures ordered by admitting team Anemia eskd - at baseline, not due for ESA until 11/15 BP/Vol - Bps stable, vol overloaded w/ bilat LE edema on exam. 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 Cont binders.      Vinson Moselle MD  [facility] 03/16/2023, 2:21 PM  Recent Labs  Lab 03/15/23 0752 03/16/23 0255  HGB 11.1* 9.1*  ALBUMIN 3.0* 2.5*  CALCIUM 9.6 8.2*  PHOS 5.0* 4.6  CREATININE 11.04* 7.28*  K 5.5* 3.9   Recent Labs  Lab 03/15/23 0752  IRON 32  TIBC 196*  FERRITIN 1,470*   Inpatient medications:  Chlorhexidine Gluconate Cloth  6 each Topical Q0600   cyanocobalamin  1,000 mcg Subcutaneous Daily   Followed by   Melene Muller ON 03/23/2023] cyanocobalamin  1,000 mcg Subcutaneous Weekly   guaiFENesin  600 mg Oral BID   heparin  5,000 Units Subcutaneous Q8H   midodrine  10 mg Oral TID WC   sodium chloride  flush  3 mL Intravenous Q12H   sodium chloride flush  3 mL Intravenous Q12H    cefTRIAXone (ROCEPHIN)  IV Stopped (03/15/23 2326)   doxycycline (VIBRAMYCIN) IV 100 mg (03/16/23 1227)   acetaminophen **OR** acetaminophen, hydrALAZINE, ondansetron **OR** ondansetron (ZOFRAN) IV, senna-docusate, sodium chloride flush

## 2023-03-16 NOTE — Evaluation (Signed)
Occupational Therapy Evaluation Patient Details Name: [name] MRN: [id number] DOB: [birth year] Today's Date: 03/16/2023   History of Present Illness Patient is an 82 y/o female admitted 03/14/23 due to respiratory failure needing bipap.  States she missed HD as she was sick at home.  Found to have multifocal pneumonia. PMH positive for ESRD on HD, HTN, anemia, endometrial cancer s/p TAH, morbid obesity, and secondary hyperparathyroidism.   Clinical Impression   Pt presenting as generally weak from her baseline, PTA she lived alone and ambulated no AD. On IE, pt demonstrating increased need for assist with LBD and was unsteady without UE support to complete pivot transfer. Educated pt on IS use, at this time she was only hitting . Pt would benefit from continued acute skilled OT services to address deficits and help transition to next level of care. Patient would benefit from post acute skilled rehab facility with <3 hours of therapy and 24/7 support        If plan is discharge home, recommend the following: A little help with walking and/or transfers;A little help with bathing/dressing/bathroom;Assistance with cooking/housework    Functional Status Assessment  Patient has had a recent decline in their functional status and demonstrates the ability to make significant improvements in function in a reasonable and predictable amount of time.  Equipment Recommendations  Tub/shower seat;Other (comment) (RW)    Recommendations for Other Services       Precautions / Restrictions Precautions Precautions: Fall Precaution Comments: Watch BP Restrictions Weight Bearing Restrictions: No      Mobility Bed Mobility   Bed Mobility: Sit to Supine       Sit to supine: Contact guard assist   General bed mobility comments: Pt sitting EOB on arrival    Transfers Overall transfer level: Needs assistance   Transfers: Sit to/from Stand, Bed to chair/wheelchair/BSC Sit to Stand:  Min assist     Step pivot transfers: Min assist     General transfer comment: Pt noted to be quite unsteady when standing and transferring requiring Min A at times to correct. Pt also noted to try and sit prematurely on BSC.      Balance Overall balance assessment: Needs assistance Sitting-balance support: Bilateral upper extremity supported, Feet supported Sitting balance-Leahy Scale: Good Sitting balance - Comments: EOB   Standing balance support: No upper extremity supported, Bilateral upper extremity supported, Reliant on assistive device for balance Standing balance-Leahy Scale: Poor Standing balance comment: Reliant on external support                           ADL either performed or assessed with clinical judgement   ADL Overall ADL's : Needs assistance/impaired Eating/Feeding: Independent;Sitting   Grooming: Sitting;Set up;Supervision/safety;Wash/dry face   Upper Body Bathing: Sitting;Set up;Supervision/ safety   Lower Body Bathing: Sitting/lateral leans;Contact guard assist   Upper Body Dressing : Sitting;Set up   Lower Body Dressing: Sitting/lateral leans;Moderate assistance Lower Body Dressing Details (indicate cue type and reason): Mod A to don L sock, hard time achieving figure four. Toilet Transfer: Minimal assistance;Stand-pivot;BSC/3in1   Toileting- Clothing Manipulation and Hygiene: Minimal assistance;Sit to/from stand Toileting - Clothing Manipulation Details (indicate cue type and reason): Min A for thorough pericare             Vision         Perception         Praxis         Pertinent Vitals/Pain Pain Assessment Pain  Assessment: No/denies pain     Extremity/Trunk Assessment Upper Extremity Assessment Upper Extremity Assessment: Overall WFL for tasks assessed   Lower Extremity Assessment Lower Extremity Assessment: Generalized weakness       Communication Communication Communication: Hearing impairment Cueing  Techniques: Verbal cues   Cognition Arousal: Alert Behavior During Therapy: WFL for tasks assessed/performed Overall Cognitive Status: Within Functional Limits for tasks assessed                                       General Comments  BP: 90/42 supine, BP: 103/48 seated, BP: 110/66 seated on BSC.    Exercises     Shoulder Instructions      Home Living Family/patient expects to be discharged to:: Private residence Living Arrangements: Alone   Type of Home: Apartment Home Access: Stairs to enter Entrance Stairs-Number of Steps: 1   Home Layout: One level     Bathroom Shower/Tub: Chief Strategy Officer: Standard     Home Equipment: None   Additional Comments: walks unaided at home. 1 fall in the last year after slipping      Prior Functioning/Environment Prior Level of Function : Independent/Modified Independent;History of Falls (last six months)             Mobility Comments: drives, cooks, cleans ADLs Comments: ind        OT Problem List: Decreased strength;Impaired balance (sitting and/or standing)      OT Treatment/Interventions: Self-care/ADL training;Balance training;Therapeutic exercise;Therapeutic activities;Patient/family education    OT Goals(Current goals can be found in the care plan section) Acute Rehab OT Goals Patient Stated Goal: To get stronger OT Goal Formulation: With patient Time For Goal Achievement: 03/30/23 Potential to Achieve Goals: Good ADL Goals Pt Will Perform Grooming: with supervision;standing Pt Will Perform Lower Body Bathing: with supervision;sit to/from stand Pt Will Transfer to Toilet: with supervision;ambulating Pt Will Perform Tub/Shower Transfer: with supervision;Tub transfer  OT Frequency: Min 1X/week    Co-evaluation              AM-PAC OT "6 Clicks" Daily Activity     Outcome Measure Help from another person eating meals?: None Help from another person taking care of personal  grooming?: A Little Help from another person toileting, which includes using toliet, bedpan, or urinal?: A Little Help from another person bathing (including washing, rinsing, drying)?: A Little Help from another person to put on and taking off regular upper body clothing?: A Little Help from another person to put on and taking off regular lower body clothing?: A Lot 6 Click Score: 18   End of Session Equipment Utilized During Treatment: Rolling walker (2 wheels) Nurse Communication: Mobility status  Activity Tolerance: Patient tolerated treatment well Patient left: in bed;with call bell/phone within reach;with bed alarm set  OT Visit Diagnosis: Unsteadiness on feet (R26.81);Other abnormalities of gait and mobility (R26.89);Muscle weakness (generalized) (M62.81)                Time: 8469-6295 OT Time Calculation (min): 18 min Charges:  OT General Charges $OT Visit: 1 Visit OT Evaluation $OT Eval Moderate Complexity: 1 Mod  03/16/2023  AB, OTR/L  Acute Rehabilitation Services  Office: [phone]   Tristan Schroeder 03/16/2023, 4:30 PM

## 2023-03-16 NOTE — Progress Notes (Signed)
   03/16/23 2141  BiPAP/CPAP/SIPAP  Reason BIPAP/CPAP not in use Other(comment) (pt states does not wera cpap at home)

## 2023-03-16 NOTE — Progress Notes (Signed)
PT Cancellation Note  Patient Details Name: [name] MRN: [id number] DOB: [birth year]   Cancelled Treatment:    Reason Eval/Treat Not Completed: Medical issues which prohibited therapy (Pt noted to be hypotensive this am. First BP: 93/41 supine. BP rechecked and found to be 83/45 supine. Pt also noted to be very lethargic however responsive to all questions. Mobility deferred at this time.)   Gladys Damme 03/16/2023, 11:05 AM

## 2023-03-16 NOTE — Progress Notes (Addendum)
Notified by RN of worsening hypotension with systolic as low as 60s. On evaluation BP trend worsening hypotension, at time of evaluation 95/49 MAP 63. Exam with 3/6 sharp cresc / decresc murmur, regular, rales in the lower 1/3 posteriorly, peripheral edema 2+, extremities warm distally. Labs this AM c/w ESRD, borderline hyper K, AG, WBC 8. Had HD with 2.5 L off this afternoon   Assessment / Plan:   Hypotension, ? Intravascular depletion post HD, ? Developing sepsis  - Blood cultures have already been drawn, NG currently. RVP neg, sputum Cx already sent.  - Check Lactate, decision on small IVF pending this result. She definitely is peripherally volume overload, wonder if could have intravascular depletion post HD  - Start Midodrine 5 mg TID, first dose now   Update:  Lactate is wnl. Given her ESRD and volume overload will hold off on additional IVF. Continue midodrine as ordered.   Dolly Rias, MD  [facility]

## 2023-03-16 NOTE — Progress Notes (Signed)
Pt receives out-pt HD at [facility] on MWF. Will assist as needed.   Kohlton Gilpatrick Renal Navigator [phone] 

## 2023-03-16 NOTE — Plan of Care (Signed)

## 2023-03-17 DIAGNOSIS — J9601 Acute respiratory failure with hypoxia: Secondary | ICD-10-CM | POA: Diagnosis not present

## 2023-03-17 LAB — COMPREHENSIVE METABOLIC PANEL
ALT: 34 U/L (ref 0–44)
AST: 41 U/L (ref 15–41)
Albumin: 2.5 g/dL — ABNORMAL LOW (ref 3.5–5.0)
Alkaline Phosphatase: 61 U/L (ref 38–126)
Anion gap: 14 (ref 5–15)
BUN: 51 mg/dL — ABNORMAL HIGH (ref 8–23)
CO2: 24 mmol/L (ref 22–32)
Calcium: 8.2 mg/dL — ABNORMAL LOW (ref 8.9–10.3)
Chloride: 91 mmol/L — ABNORMAL LOW (ref 98–111)
Creatinine, Ser: 9.45 mg/dL — ABNORMAL HIGH (ref 0.44–1.00)
GFR, Estimated: 4 mL/min — ABNORMAL LOW (ref >60)
Glucose, Bld: 87 mg/dL (ref 70–99)
Potassium: 4 mmol/L (ref 3.5–5.1)
Sodium: 129 mmol/L — ABNORMAL LOW (ref 135–145)
Total Bilirubin: 1.1 mg/dL (ref <1.2)
Total Protein: 6 g/dL — ABNORMAL LOW (ref 6.5–8.1)

## 2023-03-17 LAB — CBC
HCT: 28 % — ABNORMAL LOW (ref 36.0–46.0)
Hemoglobin: 9.4 g/dL — ABNORMAL LOW (ref 12.0–15.0)
MCH: 34.7 pg — ABNORMAL HIGH (ref 26.0–34.0)
MCHC: 33.6 g/dL (ref 30.0–36.0)
MCV: 103.3 fL — ABNORMAL HIGH (ref 80.0–100.0)
Platelets: 103 10*3/uL — ABNORMAL LOW (ref 150–400)
RBC: 2.71 MIL/uL — ABNORMAL LOW (ref 3.87–5.11)
RDW: 14.3 % (ref 11.5–15.5)
WBC: 5.6 10*3/uL (ref 4.0–10.5)
nRBC: 0 % (ref 0.0–0.2)

## 2023-03-17 MED ORDER — DOXYCYCLINE HYCLATE 100 MG PO TABS
100.0000 mg | ORAL_TABLET | Freq: Two times a day (BID) | ORAL | Status: DC
  Administered 2023-03-17 – 2023-03-19 (×4): 100 mg via ORAL
  Filled 2023-03-17 (×4): qty 1

## 2023-03-17 MED ORDER — HEPARIN SODIUM (PORCINE) 1000 UNIT/ML IJ SOLN
INTRAMUSCULAR | Status: AC
  Administered 2023-03-17: 1000 [IU]
  Filled 2023-03-17: qty 2

## 2023-03-17 MED ORDER — CHLORHEXIDINE GLUCONATE CLOTH 2 % EX PADS: 6.0000 | MEDICATED_PAD | Freq: Every day | CUTANEOUS | Status: DC

## 2023-03-17 NOTE — Progress Notes (Signed)
   03/17/23 1205  Vitals  Temp 98.4 F (36.9 C)  Pulse Rate (!) 58  Resp 17  BP 119/60  SpO2 98 %  Post Treatment  Dialyzer Clearance Lightly streaked  Hemodialysis Intake (mL) 0 mL  Liters Processed 72  Fluid Removed (mL) 3000 mL  Tolerated HD Treatment Yes  AVG/AVF Arterial Site Held (minutes) 8 minutes  AVG/AVF Venous Site Held (minutes) 8 minutes   Received patient in bed to unit.  Alert and oriented.  Informed consent signed and in chart.   TX duration:3hrs  Patient tolerated well.  Transported back to the room  Alert, without acute distress.  Hand-off given to patient's nurse.   Access used: LAVF Access issues: none  Total UF removed: 3L Medication(s) given: none    Na'Shaminy T [facility]

## 2023-03-17 NOTE — Progress Notes (Signed)
PHARMACIST - PHYSICIAN COMMUNICATION DR:   Elgergawy CONCERNING: Antibiotic IV to Oral Route Change Policy  RECOMMENDATION: This patient is receiving doxycycline by the intravenous route.  Based on criteria approved by the Pharmacy and Therapeutics Committee, the antibiotic(s) is/are being converted to the equivalent oral dose form(s).   DESCRIPTION: These criteria include: Patient being treated for a respiratory tract infection, urinary tract infection, cellulitis or clostridium difficile associated diarrhea if on metronidazole The patient is not neutropenic and does not exhibit a GI malabsorption state The patient is eating (either orally or via tube) and/or has been taking other orally administered medications for a least 24 hours The patient is improving clinically and has a Tmax < 100.5  If you have questions about this conversion, please contact the Pharmacy Department  []   [phone] )  [facility] []   [phone] )  [facility] [x]   [phone] )  [facility] []   [phone] )  [facility] []   [phone] )  [facility]    Celedonio Miyamoto, PharmD, [place] Clinical Pharmacist Phone [phone]

## 2023-03-17 NOTE — TOC Initial Note (Signed)
Transition of Care [facility]) - Initial/Assessment Note    Patient Details  Name: [name] MRN: [id number] Date of Birth: 24-Mar-1941  [facility]) CM/SW Contact:    Michaela Corner, LCSWA Phone Number: 03/17/2023, 3:37 PM  Clinical Narrative:    CSW and SW Intern met pt in her room to discuss SNF recs and Medicare.gov placement options. Pt stated she wants to go home and that her older sister and other family members are willing to help her. Pt states she wants her family is coming to the hospital and she wants them to talk to the doctor about discharge options. Pt accepted SNF list from CSW. TOC will follow up.   Expected Discharge Plan: Skilled Nursing Facility Barriers to Discharge: Other (must enter comment) (Waiting on pt and family to decide on SNF or taking pt home)   Patient Goals and CMS Choice Patient states their goals for this hospitalization and ongoing recovery are:: To go home CMS Medicare.gov Compare Post Acute Care list provided to:: Patient Choice offered to / list presented to : Patient      Expected Discharge Plan and Services In-house Referral: Clinical Social Work                                            Prior Living Arrangements/Services       Do you feel safe going back to the place where you live?: Yes      Need for Family Participation in Patient Care: Yes (Comment) Care giver support system in place?: Yes (comment)   Criminal Activity/Legal Involvement Pertinent to Current Situation/Hospitalization: No - Comment as needed  Activities of Daily Living   ADL Screening (condition at time of admission) Independently performs ADLs?: Yes (appropriate for developmental age) Is the patient deaf or have difficulty hearing?: No Does the patient have difficulty seeing, even when wearing glasses/contacts?: No Does the patient have difficulty concentrating, remembering, or making decisions?: No  Permission Sought/Granted                   Emotional Assessment Appearance:: Appears stated age Attitude/Demeanor/Rapport: Engaged, Charismatic Affect (typically observed): Calm, Pleasant, Happy, Hopeful Orientation: : Oriented to Self, Oriented to Place, Oriented to  Time, Oriented to Situation Alcohol / Substance Use: Not Applicable Psych Involvement: No (comment)  Admission diagnosis:  Hypoxia [R09.02] End stage renal disease on dialysis (HCC) [N18.6, Z99.2] Community acquired pneumonia, unspecified laterality [J18.9] Acute hypoxic respiratory failure (HCC) [J96.01] Patient Active Problem List   Diagnosis Date Noted   Acute hypoxic respiratory failure (HCC) 03/14/2023   Multifocal pneumonia 03/14/2023   Secondary hyperparathyroidism (HCC) 03/14/2023   Hyperkalemia 06/21/2018   Volume overload 06/21/2018   High anion gap metabolic acidosis 06/21/2018   Essential hypertension 06/21/2018   ESRD on dialysis (HCC) 06/20/2018   Anemia of chronic disease 11/27/2015   AKI (acute kidney injury) (HCC) 09/13/2014   Nephritic syndrome 09/12/2014   PCP:  Gean Birchwood, [facility] Pharmacy:   [facility] [id number] - [place], [place] - [address] AT [facility] OF [address] [name] [id number] Phone: [phone] Fax: [phone]     Social Determinants of Health (SDOH) Social History: SDOH Screenings   Food Insecurity: No Food Insecurity (03/15/2023)  Housing: Low Risk  (03/14/2023)  Transportation Needs: No Transportation Needs (03/14/2023)  Utilities: Not  At Risk (03/14/2023)  Tobacco Use: Low Risk  (03/14/2023)   SDOH Interventions:     Readmission Risk Interventions     No data to display

## 2023-03-17 NOTE — NC FL2 (Signed)
[place] MEDICAID FL2 LEVEL OF CARE FORM     IDENTIFICATION  Patient Name: [name] Birthdate: [birth year] Sex: female Admission Date (Current Location): 03/14/2023  [facility] and [place] Number:  Producer, television/film/video and Address:  [facility] [place], [address], [place], [place] [address]      Provider Number: [id number]  Attending Physician Name and Address:  Elgergawy, Leana Roe, MD  Relative Name and Phone Number:       Current Level of Care: Hospital Recommended Level of Care: Skilled Nursing Facility Prior Approval Number:    Date Approved/Denied:   PASRR Number: [id number]  Discharge Plan: SNF    Current Diagnoses: Patient Active Problem List   Diagnosis Date Noted   Acute hypoxic respiratory failure (HCC) 03/14/2023   Multifocal pneumonia 03/14/2023   Secondary hyperparathyroidism (HCC) 03/14/2023   Hyperkalemia 06/21/2018   Volume overload 06/21/2018   High anion gap metabolic acidosis 06/21/2018   Essential hypertension 06/21/2018   ESRD on dialysis (HCC) 06/20/2018   Anemia of chronic disease 11/27/2015   AKI (acute kidney injury) (HCC) 09/13/2014   Nephritic syndrome 09/12/2014    Orientation RESPIRATION BLADDER Height & Weight     Self, Time, Situation, Place  O2 (2L nasal cannula) Continent Weight: 159 lb 2.8 oz (72.2 kg) Height:  5' (152.4 cm)  BEHAVIORAL SYMPTOMS/MOOD NEUROLOGICAL BOWEL NUTRITION STATUS      Continent Diet (See dc summary)  AMBULATORY STATUS COMMUNICATION OF NEEDS Skin   Extensive Assist Verbally Normal                       Personal Care Assistance Level of Assistance  Bathing, Feeding, Dressing Bathing Assistance: Maximum assistance Feeding assistance: Limited assistance Dressing Assistance: Limited assistance     Functional Limitations Info             SPECIAL CARE FACTORS FREQUENCY  PT (By licensed PT), OT (By licensed OT)     PT Frequency: 5x/week OT Frequency: 5x/week             Contractures Contractures Info: Not present    Additional Factors Info  Code Status, Allergies Code Status Info: Full Allergies Info: NKA           Current Medications (03/17/2023):  This is the current hospital active medication list Current Facility-Administered Medications  Medication Dose Route Frequency Provider Last Rate Last Admin   acetaminophen (TYLENOL) tablet 650 mg  650 mg Oral Q6H PRN Janalyn Shy, Subrina, MD       Or   acetaminophen (TYLENOL) suppository 650 mg  650 mg Rectal Q6H PRN Janalyn Shy, Subrina, MD       cefTRIAXone (ROCEPHIN) 2 g in sodium chloride 0.9 % 100 mL IVPB  2 g Intravenous Q24H Janalyn Shy, Subrina, MD   Stopped at 03/17/23 0803   Chlorhexidine Gluconate Cloth 2 % PADS 6 each  6 each Topical Q0600 Arita Miss, MD   6 each at 03/17/23 0602   Chlorhexidine Gluconate Cloth 2 % PADS 6 each  6 each Topical Q0600 Delano Metz, MD   6 each at 03/17/23 0601   cyanocobalamin (VITAMIN B12) injection 1,000 mcg  1,000 mcg Subcutaneous Daily Elgergawy, Leana Roe, MD   1,000 mcg at 03/17/23 0818   Followed by   Melene Muller ON 03/23/2023] cyanocobalamin (VITAMIN B12) injection 1,000 mcg  1,000 mcg Subcutaneous Weekly Elgergawy, Leana Roe, MD       doxycycline (VIBRA-TABS) tablet 100 mg  100 mg Oral  Q12H Elgergawy, Leana Roe, MD       guaiFENesin (MUCINEX) 12 hr tablet 600 mg  600 mg Oral BID Janalyn Shy, Subrina, MD   600 mg at 03/17/23 0817   heparin injection 5,000 Units  5,000 Units Subcutaneous Q8H Sundil, Subrina, MD   5,000 Units at 03/17/23 1327   hydrALAZINE (APRESOLINE) injection 5 mg  5 mg Intravenous Q6H PRN Janalyn Shy, Subrina, MD       midodrine (PROAMATINE) tablet 10 mg  10 mg Oral TID WC Elgergawy, Leana Roe, MD   10 mg at 03/17/23 1242   ondansetron (ZOFRAN) tablet 4 mg  4 mg Oral Q6H PRN Janalyn Shy, Subrina, MD       Or   ondansetron [facility]) injection 4 mg  4 mg Intravenous Q6H PRN Janalyn Shy, Subrina, MD   4 mg at 03/16/23 1847   senna-docusate (Senokot-S) tablet 1  tablet  1 tablet Oral QHS PRN Janalyn Shy, Subrina, MD       sodium chloride flush (NS) 0.9 % injection 3 mL  3 mL Intravenous Q12H Sundil, Subrina, MD   3 mL at 03/17/23 0818   sodium chloride flush (NS) 0.9 % injection 3 mL  3 mL Intravenous Q12H Sundil, Subrina, MD   3 mL at 03/17/23 0818   sodium chloride flush (NS) 0.9 % injection 3 mL  3 mL Intravenous PRN Janalyn Shy, Subrina, MD   3 mL at 03/15/23 8119     Discharge Medications: Please see discharge summary for a list of discharge medications.  Relevant Imaging Results:  Relevant Lab Results:   Additional Information SSN: [id number]. Requires OP Dialysis transport MWF at [facility].  Mearl Latin, LCSW

## 2023-03-17 NOTE — Progress Notes (Signed)
PT Cancellation Note  Patient Details Name: Quantina Cosco MRN: [id number] DOB: [birth year]   Cancelled Treatment:    Reason Eval/Treat Not Completed: Patient at procedure or test/unavailable (Pt off the floor at dialysis. Will follow up later if time allows.)   Gladys Damme 03/17/2023, 8:47 AM

## 2023-03-17 NOTE — Progress Notes (Signed)
PROGRESS NOTE    [name]  WUJ:[id number] DOB: Jul 21, 1940 DOA: 03/14/2023 PCP: Gean Birchwood, [facility]   Brief Narrative:   This 82 y.o. female with medical history significant of ESRD on HD MWF schedule, hypertension, anemia of chronic disease, endometrial cancer status post abdominal hysterectomy, morbid obesity, secondary hyperparathyroidism presented to ED for evaluation for nausea, chest pain and shortness of breath. She was found to have O2 sat 60% room air and improved to 85% with CPAP. Patient reports she missed her dialysis today due to not being able to go, she also reports feeling better with BiPAP. She was subsequently transitioned to 5 L [place], tolerating well. Chest x-ray showing bilateral airspace disease concerning for multifocal pneumonia,  Patient was empirically started on ceftriaxone and Zithromax.  Nephrology was consulted and patient underwent hemodialysis.  Patient was admitted for acute hypoxic respiratory failure in the setting of volume overload from missing hemodialysis and multifocal pneumonia.  Assessment & Plan:   Principal Problem:   Acute hypoxic respiratory failure (HCC) Active Problems:   ESRD on dialysis [facility])   Multifocal pneumonia   Anemia of chronic disease   Hyperkalemia   Essential hypertension   Secondary hyperparathyroidism (HCC)  Acute hypoxic respiratory failure: Multifactorial Volume overload from missing hemodialysis: Multifocal pneumonia: EMS found SpO2 60% on room air requiring BiPAP this has improved, improved oxygen requirement, she is currently on 2 L nasal cannula ABG showed pH 7.3, pCO2 40, pO2 84 and bicarb 24 while on BiPAP. Chest x-ray showed bilateral airspace opacity concerning for multifocal pneumonia.  Continue ceftriaxone and Zithromax for multifocal pneumonia. Nephrology consulted.  Underwent HD, with significant improvement of respiratory status  Continue pulmonary toiletry, aspiration precaution and Xopenex as  needed for wheezing shortness of breath. Continue cardiac monitoring Follow-up blood and sputum culture.  So far remain negative   Volume overload due to missing hemodialysis. ESRD on hemodialysis. Chest x-ray showed pleural effusion and physical exam showed 2+ pitting edema in bilateral lower extremities. HD per renal  Hypotension -Lactic acid within normal limit, will increase her midodrine.   Hyperkalemia: Patient was treated with calcium gluconate, insulin and dextrose. Lokelma given. Manage with hemodialysis.  Essential hypertension: Please see above discussion under hypotension.   Anemia of chronic disease: Microcytic, following anemia panel B12 is borderline, so I will start on IM supplements.   Secondary hyperparathyroidism: Patient reports not taking PhosLo and Rocaltrol anymore.     Morbid obesity -BMI 29.  Counseled patient to compliant with dialysis and continue renal diet.   Encourage patient try to be mobile as much as she can.   Age-related debility: At baseline patient reported using his scooter outside of the house for movement however at home she tries to ambulate without any walker and rollator.  Physical exam showing morbid obesity and concern for high risk of fall. Consulting in patient PT and OT evaluation of balance.    DVT prophylaxis:Heparin sq Code Status: Full code Family Communication: No family at bed side Disposition Plan:   Status is: Inpatient Remains inpatient appropriate because: Admitted for acute hypoxic respiratory failure due to fluid overload in the setting of missed hemodialysis.  Patient also found to have multifocal pneumonia, started on amp antibiotics.  Nephrology is consulted    Consultants:  Nephrology  Procedures: Hemodialysis  Antimicrobials:  Anti-infectives (From admission, onward)    Start     Dose/Rate Route Frequency Ordered Stop   03/15/23 2200  cefTRIAXone (ROCEPHIN) 2 g in sodium chloride 0.9 % 100  mL IVPB         2 g 200 mL/hr over 30 Minutes Intravenous Every 24 hours 03/14/23 2329 03/21/23 2159   03/15/23 1200  cefTRIAXone (ROCEPHIN) 1 g in sodium chloride 0.9 % 100 mL IVPB  Status:  Discontinued        1 g 200 mL/hr over 30 Minutes Intravenous Every 24 hours 03/14/23 2020 03/14/23 2328   03/15/23 0100  doxycycline (VIBRAMYCIN) 100 mg in dextrose 5 % 250 mL IVPB        100 mg 125 mL/hr over 120 Minutes Intravenous Every 12 hours 03/15/23 0055 03/21/23 1259   03/14/23 2030  doxycycline (VIBRAMYCIN) 100 mg in dextrose 5 % 250 mL IVPB  Status:  Discontinued        100 mg 125 mL/hr over 120 Minutes Intravenous Every 12 hours 03/14/23 2020 03/15/23 0055   03/14/23 1945  cefTRIAXone (ROCEPHIN) 2 g in sodium chloride 0.9 % 100 mL IVPB        2 g 200 mL/hr over 30 Minutes Intravenous  Once 03/14/23 1943 03/15/23 0044   03/14/23 1945  azithromycin (ZITHROMAX) 500 mg in dextrose 5 % 250 mL IVPB        500 mg 250 mL/hr over 60 Minutes Intravenous  Once 03/14/23 1943 03/15/23 0159       Subjective:  Denies any complaints today, reports she is feeling better, her orthostasis has improved with midodrine  Objective: Vitals:   03/17/23 1130 03/17/23 1200 03/17/23 1205 03/17/23 1243  BP: (!) 109/55 103/67 119/60 (!) 108/52  Pulse: (!) 45 100 (!) 58 (!) 54  Resp: 19 16 17 19   Temp:   98.4 F (36.9 C) 97.9 F (36.6 C)  TempSrc:    Oral  SpO2: 100% 95% 98% 97%  Weight:      Height:        Intake/Output Summary (Last 24 hours) at 03/17/2023 1247 Last data filed at 03/17/2023 1205 Gross per 24 hour  Intake --  Output 3000 ml  Net -3000 ml   Filed Weights   03/16/23 0500 03/17/23 0500 03/17/23 0844  Weight: 74.7 kg 76.3 kg 72.2 kg    Examination:  Awake Alert, pleasant, frail, on 2 L oxygen Symmetrical Chest wall movement, Good air movement bilaterally, CTAB RRR,No Gallops,Rubs or new Murmurs, No Parasternal Heave +ve B.Sounds, Abd Soft, No tenderness, No rebound - guarding or  rigidity. No Cyanosis, Clubbing or edema, No new Rash or bruise     Data Reviewed: I have personally reviewed following labs and imaging studies  CBC: Recent Labs  Lab 03/14/23 1850 03/14/23 1853 03/14/23 2353 03/15/23 0638 03/15/23 0752 03/16/23 0255 03/17/23 0528  WBC 10.2  --  9.2  --  8.8 7.8 5.6  NEUTROABS 8.2*  --   --   --   --   --   --   HGB 11.1*   < > 11.1* 10.9* 11.1* 9.1* 9.4*  HCT 35.0*   < > 33.9* 32.0* 34.0* 27.5* 28.0*  MCV 105.7*  --  106.3*  --  107.3* 102.6* 103.3*  PLT 102*  --  108*  --  126* 84* 103*   < > = values in this interval not displayed.   Basic Metabolic Panel: Recent Labs  Lab 03/14/23 1650 03/14/23 1807 03/14/23 1853 03/15/23 0638 03/15/23 0752 03/16/23 0255 03/17/23 0528  NA 136 140 138 134* 138 134* 129*  K 6.0* 6.0* 5.2* 4.9 5.5* 3.9 4.0  CL 106 103  --   --  96* 94* 91*  CO2  --  15*  --   --  25 27 24   GLUCOSE 159* 118*  --   --  83 132* 87  BUN 69* 47*  --   --  55* 29* 51*  CREATININE 10.90* 10.57*  --   --  11.04* 7.28* 9.45*  CALCIUM  --  9.6  --   --  9.6 8.2* 8.2*  MG  --   --   --   --   --  1.6*  --   PHOS  --   --   --   --  5.0* 4.6  --    GFR: Estimated Creatinine Clearance: 4.1 mL/min (A) (by C-G formula based on SCr of 9.45 mg/dL (H)). Liver Function Tests: Recent Labs  Lab 03/14/23 1807 03/15/23 0752 03/16/23 0255 03/17/23 0528  AST 52* 47* 39 41  ALT 30 33 29 34  ALKPHOS 72 63 52 61  BILITOT 1.3* 1.4* 1.3* 1.1  PROT 6.8 6.4* 5.3* 6.0*  ALBUMIN 3.4* 3.0* 2.5* 2.5*   No results for input(s): "LIPASE", "AMYLASE" in the last 168 hours. No results for input(s): "AMMONIA" in the last 168 hours. Coagulation Profile: No results for input(s): "INR", "PROTIME" in the last 168 hours. Cardiac Enzymes: No results for input(s): "CKTOTAL", "CKMB", "CKMBINDEX", "TROPONINI" in the last 168 hours. BNP (last 3 results) No results for input(s): "PROBNP" in the last 8760 hours. HbA1C: No results for input(s):  "HGBA1C" in the last 72 hours. CBG: Recent Labs  Lab 03/15/23 0018  GLUCAP 104*   Lipid Profile: No results for input(s): "CHOL", "HDL", "LDLCALC", "TRIG", "CHOLHDL", "LDLDIRECT" in the last 72 hours. Thyroid Function Tests: No results for input(s): "TSH", "T4TOTAL", "FREET4", "T3FREE", "THYROIDAB" in the last 72 hours. Anemia Panel: Recent Labs    03/15/23 0530 03/15/23 0752  VITAMINB12  --  320  FOLATE 9.6  --   FERRITIN  --  1,470*  TIBC  --  196*  IRON  --  32  RETICCTPCT 3.8*  --    Sepsis Labs: Recent Labs  Lab 03/15/23 0752 03/16/23 0255  PROCALCITON 1.09  --   LATICACIDVEN  --  1.6    Recent Results (from the past 240 hour(s))  SARS Coronavirus 2 by RT PCR (hospital order, performed in [facility] hospital lab) *cepheid single result test* Anterior Nasal Swab     Status: None   Collection Time: 03/14/23  4:29 PM   Specimen: Anterior Nasal Swab  Result Value Ref Range Status   SARS Coronavirus 2 by RT PCR NEGATIVE NEGATIVE Final    Comment: Performed at [facility], [address], [place], [place] [address]  Culture, blood (routine x 2) Call MD if unable to obtain prior to antibiotics being given     Status: None (Preliminary result)   Collection Time: 03/14/23 11:53 PM   Specimen: BLOOD RIGHT HAND  Result Value Ref Range Status   Specimen Description BLOOD RIGHT HAND  Final   Special Requests   Final    BOTTLES DRAWN AEROBIC AND ANAEROBIC Blood Culture adequate volume   Culture   Final    NO GROWTH 2 DAYS Performed at [facility], [address], [place], [place] [address]    Report Status PENDING  Incomplete  Culture, blood (routine x 2) Call MD if unable to obtain prior to antibiotics being given     Status: None (Preliminary result)   Collection Time: 03/14/23 11:55 PM  Specimen: BLOOD RIGHT WRIST  Result Value Ref Range Status   Specimen Description BLOOD RIGHT WRIST  Final   Special Requests   Final    BOTTLES DRAWN AEROBIC AND  ANAEROBIC Blood Culture results may not be optimal due to an excessive volume of blood received in culture bottles   Culture   Final    NO GROWTH 2 DAYS Performed at [facility], [address], [place], [place] [address]    Report Status PENDING  Incomplete  Respiratory (~20 pathogens) panel by PCR     Status: None   Collection Time: 03/14/23 11:55 PM   Specimen: Nasopharyngeal Swab; Respiratory  Result Value Ref Range Status   Adenovirus NOT DETECTED NOT DETECTED Final   Coronavirus 229E NOT DETECTED NOT DETECTED Final    Comment: (NOTE) The Coronavirus on the Respiratory Panel, DOES NOT test for the novel  Coronavirus (2019 nCoV)    Coronavirus HKU1 NOT DETECTED NOT DETECTED Final   Coronavirus NL63 NOT DETECTED NOT DETECTED Final   Coronavirus OC43 NOT DETECTED NOT DETECTED Final   Metapneumovirus NOT DETECTED NOT DETECTED Final   Rhinovirus / Enterovirus NOT DETECTED NOT DETECTED Final   Influenza A NOT DETECTED NOT DETECTED Final   Influenza B NOT DETECTED NOT DETECTED Final   Parainfluenza Virus 1 NOT DETECTED NOT DETECTED Final   Parainfluenza Virus 2 NOT DETECTED NOT DETECTED Final   Parainfluenza Virus 3 NOT DETECTED NOT DETECTED Final   Parainfluenza Virus 4 NOT DETECTED NOT DETECTED Final   Respiratory Syncytial Virus NOT DETECTED NOT DETECTED Final   Bordetella pertussis NOT DETECTED NOT DETECTED Final   Bordetella Parapertussis NOT DETECTED NOT DETECTED Final   Chlamydophila pneumoniae NOT DETECTED NOT DETECTED Final   Mycoplasma pneumoniae NOT DETECTED NOT DETECTED Final    Comment: Performed at [facility], [address] [place], [place] [address]  MRSA Next Gen by PCR, Nasal     Status: None   Collection Time: 03/15/23  6:00 PM   Specimen: Nasal Mucosa; Nasal Swab  Result Value Ref Range Status   MRSA by PCR Next Gen NOT DETECTED NOT DETECTED Final    Comment: (NOTE) The GeneXpert MRSA Assay (FDA approved for NASAL specimens only), is one  component of a comprehensive MRSA colonization surveillance program. It is not intended to diagnose MRSA infection nor to guide or monitor treatment for MRSA infections. Test performance is not FDA approved in patients less than 79 years old. Performed at [facility], [address] [place], [place] [address]   Expectorated Sputum Assessment w Gram Stain, Rflx to Resp Cult     Status: None (Preliminary result)   Collection Time: 03/16/23  2:08 AM   Specimen: Expectorated Sputum  Result Value Ref Range Status   Specimen Description EXPECTORATED SPUTUM  Final   Special Requests Immunocompromised  Final   Sputum evaluation   Final    Sputum specimen not acceptable for testing.  Please recollect.   Performed at [facility], [address], [place], [place] [address]    Report Status PENDING  Incomplete    Radiology Studies: No results found.  Scheduled Meds:  Chlorhexidine Gluconate Cloth  6 each Topical Q0600   Chlorhexidine Gluconate Cloth  6 each Topical Q0600   cyanocobalamin  1,000 mcg Subcutaneous Daily   Followed by   Melene Muller ON 03/23/2023] cyanocobalamin  1,000 mcg Subcutaneous Weekly   guaiFENesin  600 mg Oral BID   heparin  5,000 Units Subcutaneous Q8H   midodrine  10 mg Oral TID WC   sodium chloride flush  3 mL Intravenous Q12H   sodium chloride flush  3 mL Intravenous Q12H   Continuous Infusions:  cefTRIAXone (ROCEPHIN)  IV Stopped (03/17/23 0803)   doxycycline (VIBRAMYCIN) IV 100 mg (03/17/23 1242)     LOS: 3 days     Huey Bienenstock, MD [facility]   If 7PM-7AM, please contact night-coverage

## 2023-03-17 NOTE — Progress Notes (Addendum)
[facility] Progress Note  Subjective: seen in room.   Vitals:   03/17/23 1130 03/17/23 1200 03/17/23 1205 03/17/23 1243  BP: (!) 109/55 103/67 119/60 (!) 108/52  Pulse: (!) 45 100 (!) 58 (!) 54  Resp: 19 16 17 19   Temp:   98.4 F (36.9 C) 97.9 F (36.6 C)  TempSrc:    Oral  SpO2: 100% 95% 98% 97%  Weight:      Height:        Exam:  Elderly AAF on 3 L [place]  Chest - bilat basilar rales, no wheezing  Cor - RRR no RG  Abd - soft ntnd no ascites  Ext - 1-2+ bilat pretib edema  Neuro - alert and responsive   LUA AVF+bruit     OP HD: MWF [facility]  3.5h  350/1.5   70.7kg   2/2 bath  AVF  Heparin 2200 - last OP HD 11/08, getting to dry wt - rocaltrol 1.25 micrograms  - mircera 120 mcg IV q 2, last 11/1, due 11/15    Assessment/ Plan: AHRF: pulmonary edema vs possible multifocal PNA. Getting IV abx. Off bipap sp HD 11/12 w/ 2.5 L UF.  BP's were then low overnight in the 70s- 90s. Had HD again today w/ 3 L off. Not sure if wears home O2 or not.  ESRD - missed Monday HD. HD Tuesday as above. HD today off schedule. HD tomorrow.  Hyperkalemia - resolved. Anemia eskd - Hb 9s here, due for esa. Start darbe 100 mcg here weekly sq on Thursdays.  BP/Vol - Bps stable, vol overloaded w/ bilat LE edema on exam. 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 Cont binders.    Vinson Moselle MD  [facility] 03/17/2023, 4:15 PM  Recent Labs  Lab 03/15/23 0752 03/16/23 0255 03/17/23 0528  HGB 11.1* 9.1* 9.4*  ALBUMIN 3.0* 2.5* 2.5*  CALCIUM 9.6 8.2* 8.2*  PHOS 5.0* 4.6  --   CREATININE 11.04* 7.28* 9.45*  K 5.5* 3.9 4.0   Recent Labs  Lab 03/15/23 0752  IRON 32  TIBC 196*  FERRITIN 1,470*   Inpatient medications:  Chlorhexidine Gluconate Cloth  6 each Topical Q0600   Chlorhexidine Gluconate Cloth  6 each Topical Q0600   cyanocobalamin  1,000 mcg Subcutaneous Daily   Followed by   Melene Muller ON 03/23/2023] cyanocobalamin  1,000 mcg Subcutaneous Weekly    doxycycline  100 mg Oral Q12H   guaiFENesin  600 mg Oral BID   heparin  5,000 Units Subcutaneous Q8H   midodrine  10 mg Oral TID WC   sodium chloride flush  3 mL Intravenous Q12H   sodium chloride flush  3 mL Intravenous Q12H    cefTRIAXone (ROCEPHIN)  IV Stopped (03/17/23 0803)   acetaminophen **OR** acetaminophen, hydrALAZINE, ondansetron **OR** ondansetron (ZOFRAN) IV, senna-docusate, sodium chloride flush

## 2023-03-17 NOTE — Plan of Care (Signed)

## 2023-03-17 NOTE — Progress Notes (Signed)
Mobility Specialist Progress Note;   03/17/23 1505  Mobility  Activity Transferred from chair to bed  Level of Assistance Minimal assist, patient does 75% or more  Assistive Device Front wheel walker  Activity Response Tolerated well  Mobility Referral Yes  $Mobility charge 1 Mobility  Mobility Specialist Start Time (ACUTE ONLY) 1505  Mobility Specialist Stop Time (ACUTE ONLY) 1520  Mobility Specialist Time Calculation (min) (ACUTE ONLY) 15 min   Pt requesting assistance back to bed. Required MinA for STS and for transfer from chair to bed. VSS throughout. Pt stating she is not feeling good today and "today is one of those bad days". Pt returned back to bed with all needs met. Bed alarm on.  Caesar Bookman Mobility Specialist Please contact via SecureChat or Rehab Office [phone]

## 2023-03-18 DIAGNOSIS — J9601 Acute respiratory failure with hypoxia: Secondary | ICD-10-CM | POA: Diagnosis not present

## 2023-03-18 LAB — COMPREHENSIVE METABOLIC PANEL
ALT: 29 U/L (ref 0–44)
AST: 30 U/L (ref 15–41)
Albumin: 2.4 g/dL — ABNORMAL LOW (ref 3.5–5.0)
Alkaline Phosphatase: 61 U/L (ref 38–126)
Anion gap: 14 (ref 5–15)
BUN: 37 mg/dL — ABNORMAL HIGH (ref 8–23)
CO2: 26 mmol/L (ref 22–32)
Calcium: 8.4 mg/dL — ABNORMAL LOW (ref 8.9–10.3)
Chloride: 95 mmol/L — ABNORMAL LOW (ref 98–111)
Creatinine, Ser: 7.24 mg/dL — ABNORMAL HIGH (ref 0.44–1.00)
GFR, Estimated: 5 mL/min — ABNORMAL LOW (ref >60)
Glucose, Bld: 85 mg/dL (ref 70–99)
Potassium: 3.9 mmol/L (ref 3.5–5.1)
Sodium: 135 mmol/L (ref 135–145)
Total Bilirubin: 0.7 mg/dL (ref <1.2)
Total Protein: 5.5 g/dL — ABNORMAL LOW (ref 6.5–8.1)

## 2023-03-18 LAB — CBC
HCT: 28.6 % — ABNORMAL LOW (ref 36.0–46.0)
Hemoglobin: 9.3 g/dL — ABNORMAL LOW (ref 12.0–15.0)
MCH: 33.3 pg (ref 26.0–34.0)
MCHC: 32.5 g/dL (ref 30.0–36.0)
MCV: 102.5 fL — ABNORMAL HIGH (ref 80.0–100.0)
Platelets: 127 10*3/uL — ABNORMAL LOW (ref 150–400)
RBC: 2.79 MIL/uL — ABNORMAL LOW (ref 3.87–5.11)
RDW: 13.8 % (ref 11.5–15.5)
WBC: 4.3 10*3/uL (ref 4.0–10.5)
nRBC: 0 % (ref 0.0–0.2)

## 2023-03-18 MED ORDER — MIDODRINE HCL 5 MG PO TABS
5.0000 mg | ORAL_TABLET | Freq: Three times a day (TID) | ORAL | Status: DC
  Administered 2023-03-18 – 2023-03-19 (×3): 5 mg via ORAL
  Filled 2023-03-18 (×3): qty 1

## 2023-03-18 NOTE — Progress Notes (Signed)
Occupational Therapy Treatment Patient Details Name: [name] MRN: [id number] DOB: March 20, 1941 Today's Date: 03/18/2023   History of present illness Patient is an 82 y/o female admitted 03/14/23 due to respiratory failure needing bipap.  States she missed HD as she was sick at home.  Found to have multifocal pneumonia. PMH positive for ESRD on HD, HTN, anemia, endometrial cancer s/p TAH, morbid obesity, and secondary hyperparathyroidism.   OT comments  Pt demonstrating significant improvement during today's treatment session then on IE. Pt grossly CGA to supervision for ambulation and ADLs, did need RW for safe steadying assist. Pt walked several hundred feet without fatigue. Next OT session to reassess how pt does with dressing/bathing, may attempted to see pt on an HD day to ensure she is still functional to safely DC home following treatments. DC recs updated to [facility]       If plan is discharge home, recommend the following:  A little help with bathing/dressing/bathroom;Assistance with cooking/housework   Equipment Recommendations  Tub/shower seat;Other (comment) (RW)    Recommendations for Other Services      Precautions / Restrictions Precautions Precautions: Fall Precaution Comments: Watch BP Restrictions Weight Bearing Restrictions: No       Mobility Bed Mobility               General bed mobility comments: Pt sitting EOB on arrival    Transfers Overall transfer level: Needs assistance Equipment used: Rolling walker (2 wheels) Transfers: Sit to/from Stand, Bed to chair/wheelchair/BSC Sit to Stand: Contact guard assist     Step pivot transfers: Supervision     General transfer comment: Cues for hand placement.     Balance Overall balance assessment: Needs assistance Sitting-balance support: Bilateral upper extremity supported, Feet supported Sitting balance-Leahy Scale: Good Sitting balance - Comments: EOB   Standing balance support: No upper  extremity supported, Bilateral upper extremity supported, Reliant on assistive device for balance Standing balance-Leahy Scale: Fair Standing balance comment: Reliant on external support however able to briefly ambulate without it.                           ADL either performed or assessed with clinical judgement   ADL       Grooming: Supervision/safety;Standing;Wash/dry hands                   Toilet Transfer: Supervision/safety;Rolling walker (2 wheels);Ambulation           Functional mobility during ADLs: Supervision/safety;Rolling walker (2 wheels) General ADL Comments: Pt ambulating 649ft in hallway with Supervision + RW    Extremity/Trunk Assessment              Vision       Perception     Praxis      Cognition Arousal: Alert Behavior During Therapy: WFL for tasks assessed/performed Overall Cognitive Status: Within Functional Limits for tasks assessed                                          Exercises      Shoulder Instructions       General Comments VSS on RA, Sp02 fluctuating 90-95% Sp02 with good pleth    Pertinent Vitals/ Pain       Pain Assessment Pain Assessment: No/denies pain  Home Living  Prior Functioning/Environment              Frequency  Min 1X/week        Progress Toward Goals  OT Goals(current goals can now be found in the care plan section)  Progress towards OT goals: Progressing toward goals  Acute Rehab OT Goals Patient Stated Goal: To get stronger OT Goal Formulation: With patient Time For Goal Achievement: 03/30/23 Potential to Achieve Goals: Good ADL Goals Pt Will Perform Grooming: with modified independence;standing Pt Will Perform Lower Body Bathing: with modified independence;sit to/from stand Pt Will Transfer to Toilet: with modified independence;ambulating Pt Will Perform Tub/Shower Transfer: with modified  independence;Tub transfer  Plan      Co-evaluation    PT/OT/SLP Co-Evaluation/Treatment: Yes Reason for Co-Treatment: Other (comment) (Gait progression) PT goals addressed during session: Mobility/safety with mobility OT goals addressed during session: ADL's and self-care      AM-PAC OT "6 Clicks" Daily Activity     Outcome Measure   Help from another person eating meals?: None Help from another person taking care of personal grooming?: A Little Help from another person toileting, which includes using toliet, bedpan, or urinal?: A Little Help from another person bathing (including washing, rinsing, drying)?: A Little Help from another person to put on and taking off regular upper body clothing?: A Little Help from another person to put on and taking off regular lower body clothing?: A Lot 6 Click Score: 18    End of Session Equipment Utilized During Treatment: Rolling walker (2 wheels);Gait belt  OT Visit Diagnosis: Unsteadiness on feet (R26.81);Other abnormalities of gait and mobility (R26.89);Muscle weakness (generalized) (M62.81)   Activity Tolerance Patient tolerated treatment well   Patient Left in chair;with call bell/phone within reach;with chair alarm set   Nurse Communication Mobility status        Time: 1212-1239 OT Time Calculation (min): 27 min  Charges: OT General Charges $OT Visit: 1 Visit OT Treatments $Therapeutic Activity: 8-22 mins  03/18/2023  AB, OTR/L  Acute Rehabilitation Services  Office: [phone]   Tristan Schroeder 03/18/2023, 5:10 PM

## 2023-03-18 NOTE — TOC Progression Note (Signed)
Transition of Care [facility]) - Progression Note    Patient Details  Name: [name] MRN: [id number] Date of Birth: 1941/01/14  Transition of Care [facility]) CM/SW Contact  Mearl Latin, LCSW Phone Number: 03/18/2023, 12:23 PM  Clinical Narrative:    CSW contacted patient's niece per MD to confirm patient's wishes to return home. CSW spoke with niece, Meredeth Ide and she confirmed that her mother is there with patient with the support of other family members including herself and they do not want SNF placement. She requested home health in network with her insurance. She requested Wheelchair if possible and declined BSC. Patient does not have home O2. CSW confirmed address and PCP. Family will transport patient home at discharge. Info provided to [facility] for home set up.    Expected Discharge Plan: Home w Home Health Services Barriers to Discharge: Continued Medical Work up  Expected Discharge Plan and Services In-house Referral: Clinical Social Work Discharge Planning Services: CM Consult Post Acute Care Choice: Durable Medical Equipment, Home Health Living arrangements for the past 2 months: Apartment                                       Social Determinants of Health (SDOH) Interventions SDOH Screenings   Food Insecurity: No Food Insecurity (03/15/2023)  Housing: Low Risk  (03/14/2023)  Transportation Needs: No Transportation Needs (03/14/2023)  Utilities: Not At Risk (03/14/2023)  Tobacco Use: Low Risk  (03/14/2023)    Readmission Risk Interventions     No data to display

## 2023-03-18 NOTE — Progress Notes (Signed)
PROGRESS NOTE    [name]  ZOX:[id number] DOB: 03-22-1941 DOA: 03/14/2023 PCP: Gean Birchwood, [facility]   Brief Narrative:   This 82 y.o. female with medical history significant of ESRD on HD MWF schedule, hypertension, anemia of chronic disease, endometrial cancer status post abdominal hysterectomy, morbid obesity, secondary hyperparathyroidism presented to ED for evaluation for nausea, chest pain and shortness of breath. She was found to have O2 sat 60% room air and improved to 85% with CPAP. Patient reports she missed her dialysis today due to not being able to go, she also reports feeling better with BiPAP. She was subsequently transitioned to 5 L [place], tolerating well. Chest x-ray showing bilateral airspace disease concerning for multifocal pneumonia,  Patient was empirically started on ceftriaxone and Zithromax.  Nephrology was consulted and patient underwent hemodialysis.  Patient was admitted for acute hypoxic respiratory failure in the setting of volume overload from missing hemodialysis and multifocal pneumonia.  Assessment & Plan:   Principal Problem:   Acute hypoxic respiratory failure (HCC) Active Problems:   ESRD on dialysis [facility])   Multifocal pneumonia   Anemia of chronic disease   Hyperkalemia   Essential hypertension   Secondary hyperparathyroidism (HCC)  Acute hypoxic respiratory failure: Multifactorial Volume overload from missing hemodialysis: Multifocal pneumonia: EMS found SpO2 60% on room air requiring BiPAP this has improved, improved oxygen requirement, she is currently on 2 L nasal cannula ABG showed pH 7.3, pCO2 40, pO2 84 and bicarb 24 while on BiPAP. Chest x-ray showed bilateral airspace opacity concerning for multifocal pneumonia.  Continue ceftriaxone and Zithromax for multifocal pneumonia.  Continue another 24 hours of IV antibiotics. Nephrology consulted.  Underwent HD, with significant improvement of respiratory status, she will go another  session of HD today, to go back on her schedule Monday Wednesday Friday. Continue pulmonary toiletry, aspiration precaution and Xopenex as needed for wheezing shortness of breath. Continue cardiac monitoring Follow-up blood and sputum culture.  So far remain negative   Volume overload due to missing hemodialysis. ESRD on hemodialysis. Chest x-ray showed pleural effusion and physical exam showed 2+ pitting edema in bilateral lower extremities. HD per renal  Hypotension -Lactic acid within normal limit, on midodrine, dose was increased, but I will decrease again today given bradycardia.   Hyperkalemia: Patient was treated with calcium gluconate, insulin and dextrose. Lokelma given. Manage with hemodialysis.  Essential hypertension: Please see above discussion under hypotension. Continue to hold Bystolic  Bradycardia -Heart rate noted to be in the 40s when she sleeps, in the 50s when she wakes up, she is asymptomatic, I will decrease her midodrine, and continue to hold her Bystolic at time of discharge   Anemia of chronic disease: Microcytic, following anemia panel B12 is borderline, so I will start on IM supplements.   Secondary hyperparathyroidism: Patient reports not taking PhosLo and Rocaltrol anymore.     Morbid obesity -BMI 29.  Counseled patient to compliant with dialysis and continue renal diet.   Encourage patient try to be mobile as much as she can.   Age-related debility: At baseline patient reported using his scooter outside of the house for movement however at home she tries to ambulate without any walker and rollator.  Physical exam showing morbid obesity and concern for high risk of fall. Consulting in patient PT and OT evaluation of balance.    DVT prophylaxis:Heparin sq Code Status: Full code Family Communication: No family at bed side, Discussed with her niece by phone Status is: Inpatient Remains inpatient appropriate  because: Admitted for acute hypoxic  respiratory failure due to fluid overload in the setting of missed hemodialysis.  Patient also found to have multifocal pneumonia, started on amp antibiotics.  Nephrology is consulted    Consultants:  Nephrology  Procedures: Hemodialysis  Antimicrobials:  Anti-infectives (From admission, onward)    Start     Dose/Rate Route Frequency Ordered Stop   03/17/23 2200  doxycycline (VIBRA-TABS) tablet 100 mg        100 mg Oral Every 12 hours 03/17/23 1258 03/21/23 0959   03/15/23 2200  cefTRIAXone (ROCEPHIN) 2 g in sodium chloride 0.9 % 100 mL IVPB        2 g 200 mL/hr over 30 Minutes Intravenous Every 24 hours 03/14/23 2329 03/21/23 2159   03/15/23 1200  cefTRIAXone (ROCEPHIN) 1 g in sodium chloride 0.9 % 100 mL IVPB  Status:  Discontinued        1 g 200 mL/hr over 30 Minutes Intravenous Every 24 hours 03/14/23 2020 03/14/23 2328   03/15/23 0100  doxycycline (VIBRAMYCIN) 100 mg in dextrose 5 % 250 mL IVPB  Status:  Discontinued        100 mg 125 mL/hr over 120 Minutes Intravenous Every 12 hours 03/15/23 0055 03/17/23 1258   03/14/23 2030  doxycycline (VIBRAMYCIN) 100 mg in dextrose 5 % 250 mL IVPB  Status:  Discontinued        100 mg 125 mL/hr over 120 Minutes Intravenous Every 12 hours 03/14/23 2020 03/15/23 0055   03/14/23 1945  cefTRIAXone (ROCEPHIN) 2 g in sodium chloride 0.9 % 100 mL IVPB        2 g 200 mL/hr over 30 Minutes Intravenous  Once 03/14/23 1943 03/15/23 0044   03/14/23 1945  azithromycin (ZITHROMAX) 500 mg in dextrose 5 % 250 mL IVPB        500 mg 250 mL/hr over 60 Minutes Intravenous  Once 03/14/23 1943 03/15/23 0159       Subjective:  No significant events overnight, was noted to be bradycardic overnight, but asymptomatic, blood pressure has improved with midodrine.  Objective: Vitals:   03/18/23 0415 03/18/23 0800 03/18/23 0901 03/18/23 1211  BP: (!) 107/53 (!) 120/53  116/64  Pulse: (!) 49 (!) 52  (!) 58  Resp: 13 15  18   Temp: 98.1 F (36.7 C) 98 F (36.7  C)    TempSrc:      SpO2: 98% 98% 94% 91%  Weight: 69.6 kg     Height:       No intake or output data in the 24 hours ending 03/18/23 1343  Filed Weights   03/17/23 0500 03/17/23 0844 03/18/23 0415  Weight: 76.3 kg 72.2 kg 69.6 kg    Examination:  Awake Alert, frail, pleasant Symmetrical Chest wall movement, Good air movement bilaterally,  RRR,No Gallops,Rubs or new Murmurs, No Parasternal Heave +ve B.Sounds, Abd Soft, No tenderness, No rebound - guarding or rigidity. No Cyanosis, Clubbing or edema, No new Rash or bruise     Data Reviewed: I have personally reviewed following labs and imaging studies  CBC: Recent Labs  Lab 03/14/23 1850 03/14/23 1853 03/14/23 2353 03/15/23 9147 03/15/23 0752 03/16/23 0255 03/17/23 0528 03/18/23 0314  WBC 10.2  --  9.2  --  8.8 7.8 5.6 4.3  NEUTROABS 8.2*  --   --   --   --   --   --   --   HGB 11.1*   < > 11.1* 10.9* 11.1* 9.1* 9.4* 9.3*  HCT 35.0*   < > 33.9* 32.0* 34.0* 27.5* 28.0* 28.6*  MCV 105.7*  --  106.3*  --  107.3* 102.6* 103.3* 102.5*  PLT 102*  --  108*  --  126* 84* 103* 127*   < > = values in this interval not displayed.   Basic Metabolic Panel: Recent Labs  Lab 03/14/23 1807 03/14/23 1853 03/15/23 1191 03/15/23 0752 03/16/23 0255 03/17/23 0528 03/18/23 0314  NA 140   < > 134* 138 134* 129* 135  K 6.0*   < > 4.9 5.5* 3.9 4.0 3.9  CL 103  --   --  96* 94* 91* 95*  CO2 15*  --   --  25 27 24 26   GLUCOSE 118*  --   --  83 132* 87 85  BUN 47*  --   --  55* 29* 51* 37*  CREATININE 10.57*  --   --  11.04* 7.28* 9.45* 7.24*  CALCIUM 9.6  --   --  9.6 8.2* 8.2* 8.4*  MG  --   --   --   --  1.6*  --   --   PHOS  --   --   --  5.0* 4.6  --   --    < > = values in this interval not displayed.   GFR: Estimated Creatinine Clearance: 5.2 mL/min (A) (by C-G formula based on SCr of 7.24 mg/dL (H)). Liver Function Tests: Recent Labs  Lab 03/14/23 1807 03/15/23 0752 03/16/23 0255 03/17/23 0528 03/18/23 0314  AST  52* 47* 39 41 30  ALT 30 33 29 34 29  ALKPHOS 72 63 52 61 61  BILITOT 1.3* 1.4* 1.3* 1.1 0.7  PROT 6.8 6.4* 5.3* 6.0* 5.5*  ALBUMIN 3.4* 3.0* 2.5* 2.5* 2.4*   No results for input(s): "LIPASE", "AMYLASE" in the last 168 hours. No results for input(s): "AMMONIA" in the last 168 hours. Coagulation Profile: No results for input(s): "INR", "PROTIME" in the last 168 hours. Cardiac Enzymes: No results for input(s): "CKTOTAL", "CKMB", "CKMBINDEX", "TROPONINI" in the last 168 hours. BNP (last 3 results) No results for input(s): "PROBNP" in the last 8760 hours. HbA1C: No results for input(s): "HGBA1C" in the last 72 hours. CBG: Recent Labs  Lab 03/15/23 0018  GLUCAP 104*   Lipid Profile: No results for input(s): "CHOL", "HDL", "LDLCALC", "TRIG", "CHOLHDL", "LDLDIRECT" in the last 72 hours. Thyroid Function Tests: No results for input(s): "TSH", "T4TOTAL", "FREET4", "T3FREE", "THYROIDAB" in the last 72 hours. Anemia Panel: No results for input(s): "VITAMINB12", "FOLATE", "FERRITIN", "TIBC", "IRON", "RETICCTPCT" in the last 72 hours.  Sepsis Labs: Recent Labs  Lab 03/15/23 0752 03/16/23 0255  PROCALCITON 1.09  --   LATICACIDVEN  --  1.6    Recent Results (from the past 240 hour(s))  SARS Coronavirus 2 by RT PCR (hospital order, performed in [facility] hospital lab) *cepheid single result test* Anterior Nasal Swab     Status: None   Collection Time: 03/14/23  4:29 PM   Specimen: Anterior Nasal Swab  Result Value Ref Range Status   SARS Coronavirus 2 by RT PCR NEGATIVE NEGATIVE Final    Comment: Performed at [facility], [address], [place], [place] [address]  Culture, blood (routine x 2) Call MD if unable to obtain prior to antibiotics being given     Status: None (Preliminary result)   Collection Time: 03/14/23 11:53 PM   Specimen: BLOOD RIGHT HAND  Result Value Ref Range Status  Specimen Description BLOOD RIGHT HAND  Final   Special Requests   Final    BOTTLES  DRAWN AEROBIC AND ANAEROBIC Blood Culture adequate volume   Culture   Final    NO GROWTH 3 DAYS Performed at [facility], [address], [place], [place] [address]    Report Status PENDING  Incomplete  Culture, blood (routine x 2) Call MD if unable to obtain prior to antibiotics being given     Status: None (Preliminary result)   Collection Time: 03/14/23 11:55 PM   Specimen: BLOOD RIGHT WRIST  Result Value Ref Range Status   Specimen Description BLOOD RIGHT WRIST  Final   Special Requests   Final    BOTTLES DRAWN AEROBIC AND ANAEROBIC Blood Culture results may not be optimal due to an excessive volume of blood received in culture bottles   Culture   Final    NO GROWTH 3 DAYS Performed at [facility], [address] [place], [place] [address]    Report Status PENDING  Incomplete  Respiratory (~20 pathogens) panel by PCR     Status: None   Collection Time: 03/14/23 11:55 PM   Specimen: Nasopharyngeal Swab; Respiratory  Result Value Ref Range Status   Adenovirus NOT DETECTED NOT DETECTED Final   Coronavirus 229E NOT DETECTED NOT DETECTED Final    Comment: (NOTE) The Coronavirus on the Respiratory Panel, DOES NOT test for the novel  Coronavirus (2019 nCoV)    Coronavirus HKU1 NOT DETECTED NOT DETECTED Final   Coronavirus NL63 NOT DETECTED NOT DETECTED Final   Coronavirus OC43 NOT DETECTED NOT DETECTED Final   Metapneumovirus NOT DETECTED NOT DETECTED Final   Rhinovirus / Enterovirus NOT DETECTED NOT DETECTED Final   Influenza A NOT DETECTED NOT DETECTED Final   Influenza B NOT DETECTED NOT DETECTED Final   Parainfluenza Virus 1 NOT DETECTED NOT DETECTED Final   Parainfluenza Virus 2 NOT DETECTED NOT DETECTED Final   Parainfluenza Virus 3 NOT DETECTED NOT DETECTED Final   Parainfluenza Virus 4 NOT DETECTED NOT DETECTED Final   Respiratory Syncytial Virus NOT DETECTED NOT DETECTED Final   Bordetella pertussis NOT DETECTED NOT DETECTED Final   Bordetella  Parapertussis NOT DETECTED NOT DETECTED Final   Chlamydophila pneumoniae NOT DETECTED NOT DETECTED Final   Mycoplasma pneumoniae NOT DETECTED NOT DETECTED Final    Comment: Performed at [facility], [address], [place], [place] [address]  MRSA Next Gen by PCR, Nasal     Status: None   Collection Time: 03/15/23  6:00 PM   Specimen: Nasal Mucosa; Nasal Swab  Result Value Ref Range Status   MRSA by PCR Next Gen NOT DETECTED NOT DETECTED Final    Comment: (NOTE) The GeneXpert MRSA Assay (FDA approved for NASAL specimens only), is one component of a comprehensive MRSA colonization surveillance program. It is not intended to diagnose MRSA infection nor to guide or monitor treatment for MRSA infections. Test performance is not FDA approved in patients less than 26 years old. Performed at [facility] Lab, [address] [place], [place] [address]   Expectorated Sputum Assessment w Gram Stain, Rflx to Resp Cult     Status: None (Preliminary result)   Collection Time: 03/16/23  2:08 AM   Specimen: Expectorated Sputum  Result Value Ref Range Status   Specimen Description EXPECTORATED SPUTUM  Final   Special Requests Immunocompromised  Final   Sputum evaluation   Final    Sputum specimen not  acceptable for testing.  Please recollect.   Performed at [facility] Lab, [address] [place], [place] [address]    Report Status PENDING  Incomplete    Radiology Studies: No results found.  Scheduled Meds:  Chlorhexidine Gluconate Cloth  6 each Topical Q0600   cyanocobalamin  1,000 mcg Subcutaneous Daily   Followed by   Melene Muller ON 03/23/2023] cyanocobalamin  1,000 mcg Subcutaneous Weekly   doxycycline  100 mg Oral Q12H   heparin  5,000 Units Subcutaneous Q8H   midodrine  5 mg Oral TID WC   sodium chloride flush  3 mL Intravenous Q12H   sodium chloride flush  3 mL Intravenous Q12H   Continuous Infusions:  cefTRIAXone (ROCEPHIN)  IV 2 g (03/17/23 2127)     LOS: 4 days      Huey Bienenstock, MD [facility]   If 7PM-7AM, please contact night-coverage

## 2023-03-18 NOTE — Progress Notes (Signed)
Subjective: Seen in room, denies shortness of breath, for normally scheduled HD today, initially she was hesitant but discussed with her keeping her on schedule today important for her discharge planning first thing in morning.  She agreed to have dialysis today  Objective Vital signs in last 24 hours: Vitals:   03/18/23 0400 03/18/23 0415 03/18/23 0800 03/18/23 0901  BP: (!) 110/53 (!) 107/53 (!) 120/53   Pulse: (!) 48 (!) 49 (!) 52   Resp: 15 13 15    Temp: 98.1 F (36.7 C) 98.1 F (36.7 C) 98 F (36.7 C)   TempSrc:      SpO2: 95% 98% 98% 94%  Weight:  69.6 kg    Height:       Weight change: -4.1 kg  Physical Exam: General: Alert pleasant elderly female NAD room air 95% O2 sat Heart: RRR, no MRG Lungs: CTA bilaterally nonlabored breathing room air Abdomen: NABS, soft NTND no ascites Extremities: Trace bipedal edema Dialysis Access: LUA AVF positive bruit with aneurysms    OP HD: MWF [facility]  3.5h  350/1.5   70.7kg   2/2 bath  AVF  Heparin 2200 - last OP HD 11/08, getting to dry wt - rocaltrol 1.25 micrograms  - mircera 120 mcg IV q 2, last 11/1, due 11/15       Problem/Plan: AHRF: pul.  edema vs possible multifocal PNA.  Given IV abx.  Now on doxycycline, off bipap sp HD 11/12 w/ 2.5 L UF.  Yesterday 11/14 3 L UF, no SOB this morning.  Plan to keep on dialysis today are normal schedule.  BP's were then low overnight in the 70s- 90s this a.m. 116/64 BP stabilizing ESRD - missed Monday HD. HD Tuesday as above. HD yesterday off schedule.  And HD today to keep on schedule Hyperkalemia - resolved. 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 Start darbe 100 mcg here weekly sq on Thursdays.  BP/Vol - Bps stable, vol overloaded w/ bilat LE edema on exam. 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 Cont binders.   Lenny Pastel, PA-C [facility] [phone] 03/18/2023,12:13 PM  LOS: 4 days   Labs: Basic Metabolic Panel: Recent Labs  Lab  03/15/23 0752 03/16/23 0255 03/17/23 0528 03/18/23 0314  NA 138 134* 129* 135  K 5.5* 3.9 4.0 3.9  CL 96* 94* 91* 95*  CO2 25 27 24 26   GLUCOSE 83 132* 87 85  BUN 55* 29* 51* 37*  CREATININE 11.04* 7.28* 9.45* 7.24*  CALCIUM 9.6 8.2* 8.2* 8.4*  PHOS 5.0* 4.6  --   --    Liver Function Tests: Recent Labs  Lab 03/16/23 0255 03/17/23 0528 03/18/23 0314  AST 39 41 30  ALT 29 34 29  ALKPHOS 52 61 61  BILITOT 1.3* 1.1 0.7  PROT 5.3* 6.0* 5.5*  ALBUMIN 2.5* 2.5* 2.4*   No results for input(s): "LIPASE", "AMYLASE" in the last 168 hours. No results for input(s): "AMMONIA" in the last 168 hours. CBC: Recent Labs  Lab 03/14/23 1850 03/14/23 1853 03/14/23 2353 03/15/23 0623 03/15/23 0752 03/16/23 0255 03/17/23 0528 03/18/23 0314  WBC 10.2  --  9.2  --  8.8 7.8 5.6 4.3  NEUTROABS 8.2*  --   --   --   --   --   --   --   HGB 11.1*   < > 11.1*   < > 11.1* 9.1* 9.4* 9.3*  HCT 35.0*   < >  33.9*   < > 34.0* 27.5* 28.0* 28.6*  MCV 105.7*  --  106.3*  --  107.3* 102.6* 103.3* 102.5*  PLT 102*  --  108*  --  126* 84* 103* 127*   < > = values in this interval not displayed.   Cardiac Enzymes: No results for input(s): "CKTOTAL", "CKMB", "CKMBINDEX", "TROPONINI" in the last 168 hours. CBG: Recent Labs  Lab 03/15/23 0018  GLUCAP 104*    Studies/Results: No results found. Medications:  cefTRIAXone (ROCEPHIN)  IV 2 g (03/17/23 2127)    Chlorhexidine Gluconate Cloth  6 each Topical Q0600   cyanocobalamin  1,000 mcg Subcutaneous Daily   Followed by   Melene Muller ON 03/23/2023] cyanocobalamin  1,000 mcg Subcutaneous Weekly   doxycycline  100 mg Oral Q12H   heparin  5,000 Units Subcutaneous Q8H   midodrine  5 mg Oral TID WC   sodium chloride flush  3 mL Intravenous Q12H   sodium chloride flush  3 mL Intravenous Q12H

## 2023-03-18 NOTE — Progress Notes (Signed)
Physical Therapy Treatment Patient Details Name: [name] MRN: [id number] DOB: [birth year] Today's Date: 03/18/2023   History of Present Illness Patient is an 81 y/o female admitted 03/14/23 due to respiratory failure needing bipap.  States she missed HD as she was sick at home.  Found to have multifocal pneumonia. PMH positive for ESRD on HD, HTN, anemia, endometrial cancer s/p TAH, morbid obesity, and secondary hyperparathyroidism.    PT Comments  Pt tolerated treatment well today. Pt was able to progress ambulation in hallway today with RW supervision. DC recs updated to HHPT with RW. PT will continue to follow. Hopefully able to progress ambulation without AD prior to DC.   If plan is discharge home, recommend the following: A little help with walking and/or transfers;Assistance with cooking/housework   Can travel by private vehicle        Equipment Recommendations  Rolling walker (2 wheels)    Recommendations for Other Services       Precautions / Restrictions Precautions Precautions: Fall Restrictions Weight Bearing Restrictions: No     Mobility  Bed Mobility               General bed mobility comments: Pt sitting EOB on arrival    Transfers Overall transfer level: Needs assistance Equipment used: Rolling walker (2 wheels) Transfers: Sit to/from Stand Sit to Stand: Contact guard assist           General transfer comment: Cues for hand placement.    Ambulation/Gait Ambulation/Gait assistance: Supervision Gait Distance (Feet): 300 Feet Assistive device: Rolling walker (2 wheels) Gait Pattern/deviations: Decreased stride length, Step-through pattern Gait velocity: decreased     General Gait Details: no LOB noted. Chair follow provided however not needed. Pt overall steady.   Stairs             Wheelchair Mobility     Tilt Bed    Modified Rankin (Stroke Patients Only)       Balance Overall balance assessment: Needs  assistance Sitting-balance support: Bilateral upper extremity supported, Feet supported Sitting balance-Leahy Scale: Good Sitting balance - Comments: EOB   Standing balance support: No upper extremity supported, Bilateral upper extremity supported, Reliant on assistive device for balance Standing balance-Leahy Scale: Fair Standing balance comment: Reliant on external support however able to briefly ambulate without it.                            Cognition Arousal: Alert Behavior During Therapy: WFL for tasks assessed/performed Overall Cognitive Status: Within Functional Limits for tasks assessed                                          Exercises      General Comments General comments (skin integrity, edema, etc.): VSS      Pertinent Vitals/Pain Pain Assessment Pain Assessment: No/denies pain    Home Living                          Prior Function            PT Goals (current goals can now be found in the care plan section) Progress towards PT goals: Progressing toward goals    Frequency    Min 1X/week      PT Plan      Co-evaluation  AM-PAC PT "6 Clicks" Mobility   Outcome Measure  Help needed turning from your back to your side while in a flat bed without using bedrails?: A Little Help needed moving from lying on your back to sitting on the side of a flat bed without using bedrails?: A Little Help needed moving to and from a bed to a chair (including a wheelchair)?: A Little Help needed standing up from a chair using your arms (e.g., wheelchair or bedside chair)?: A Little Help needed to walk in hospital room?: A Little Help needed climbing 3-5 steps with a railing? : A Lot 6 Click Score: 17    End of Session Equipment Utilized During Treatment: Gait belt Activity Tolerance: Patient tolerated treatment well Patient left: in chair;with call bell/phone within reach Nurse Communication: Mobility  status PT Visit Diagnosis: Other abnormalities of gait and mobility (R26.89)     Time: 1212-1239 PT Time Calculation (min) (ACUTE ONLY): 27 min  Charges:    $Gait Training: 8-22 mins PT General Charges $$ ACUTE PT VISIT: 1 Visit                     Shela Nevin, PT, DPT Acute Rehab Services [phone]    Gladys Damme 03/18/2023, 4:19 PM

## 2023-03-18 NOTE — Care Management (Signed)
    Durable Medical Equipment  (From admission, onward)           Start     Ordered   03/18/23 1227  For home use only DME lightweight manual wheelchair with seat cushion  Once       Comments: Patient suffers from Weakness ESRD which impairs their ability to perform daily activities like toileting in the home.  A walker will not resolve  issue with performing activities of daily living. A wheelchair will allow patient to safely perform daily activities. Patient is not able to propel themselves in the home using a standard weight wheelchair due to general weakness. Patient can self propel in the lightweight wheelchair. Length of need Lifetime. Accessories: elevating leg rests (ELRs), wheel locks, extensions and anti-tippers.   03/18/23 1227

## 2023-03-18 NOTE — Progress Notes (Signed)
[name] was asked if she would like to get in the chair. [name] refused,she stated its to early.

## 2023-03-18 NOTE — TOC Transition Note (Signed)
Transition of Care [facility]) - CM/SW Discharge Note   Patient Details  Name: [name] MRN: [id number] Date of Birth: [birth year]  Transition of Care [facility]) CM/SW Contact:  Lockie Pares, RN Phone Number: 03/18/2023, 12:42 PM   Clinical Narrative:    Patient decided to go home with home health services. Requests wheelchair, lightweight wheelchair ordered and accepted by [employer], will bring wheelchair to room prior to DC.  Cory from [facility] accepted for services of PT, OT, RN, Aside and social work. Messaged with CSW and provider   Final next level of care: Home w Home Health Services Barriers to Discharge: Continued Medical Work up   Patient Goals and CMS Choice CMS Medicare.gov Compare Post Acute Care list provided to:: Patient Represenative (must comment) (Niece) Choice offered to / list presented to :  (Niece)  Discharge Placement    Home with home health                     Discharge Plan and Services Additional resources added to the After Visit Summary for   In-house Referral: Clinical Social Work Discharge Planning Services: CM Consult Post Acute Care Choice: Durable Medical Equipment, Home Health          DME Arranged: Community education officer wheelchair with seat cushion DME Agency: [employer] Date DME Agency Contacted: 03/18/23 Time DME Agency Contacted: 1241 Representative spoke with at DME Agency: Vaughan Basta HH Arranged: PT, OT, Nurse's Aide, RN, Social Work [employer] Agency: [facility] Date [facility] Agency Contacted: 03/18/23 Time HH Agency Contacted: 1242 Representative spoke with at [facility] Agency: Kandee Keen  Social Determinants of Health (SDOH) Interventions SDOH Screenings   Food Insecurity: No Food Insecurity (03/15/2023)  Housing: Low Risk  (03/14/2023)  Transportation Needs: No Transportation Needs (03/14/2023)  Utilities: Not At Risk (03/14/2023)  Tobacco Use: Low Risk  (03/14/2023)     Readmission Risk Interventions     No data to  display

## 2023-03-18 NOTE — Care Management Important Message (Signed)
Important Message  Patient Details  Name: [name] MRN: [id number] Date of Birth: 09-29-1940   Important Message Given:  Yes - Medicare IM     Dorena Bodo 03/18/2023, 3:39 PM

## 2023-03-18 NOTE — Progress Notes (Signed)
   03/18/23 1755  Vitals  Temp 97.7 F (36.5 C)  Pulse Rate (!) 55  Resp 17  BP 105/60  SpO2 97 %  Weight 69.5 kg  Type of Weight Post-Dialysis  Post Treatment  Dialyzer Clearance Lightly streaked  Hemodialysis Intake (mL) 0 mL  Liters Processed 62.2  Fluid Removed (mL) 2000 mL  Tolerated HD Treatment Yes  AVG/AVF Arterial Site Held (minutes) 10 minutes  AVG/AVF Venous Site Held (minutes) 10 minutes   Received patient in bed to unit.  Alert and oriented.  Informed consent signed and in chart.   TX duration:3hrs  Patient tolerated well.  Transported back to the room  Alert, without acute distress.  Hand-off given to patient's nurse.   Access used: LAVF Access issues: none  Total UF removed: 2L Medication(s) given: none    [facility]

## 2023-03-18 NOTE — Progress Notes (Signed)
   03/18/23 2103  BiPAP/CPAP/SIPAP  Reason BIPAP/CPAP not in use Non-compliant (pt resting on room air. pt states does not wear cpap at home)

## 2023-03-19 ENCOUNTER — Other Ambulatory Visit (HOSPITAL_COMMUNITY): Payer: Self-pay

## 2023-03-19 ENCOUNTER — Encounter (HOSPITAL_COMMUNITY): Payer: Self-pay

## 2023-03-19 DIAGNOSIS — J9601 Acute respiratory failure with hypoxia: Secondary | ICD-10-CM | POA: Diagnosis not present

## 2023-03-19 DIAGNOSIS — J189 Pneumonia, unspecified organism: Secondary | ICD-10-CM | POA: Diagnosis not present

## 2023-03-19 DIAGNOSIS — Z992 Dependence on renal dialysis: Secondary | ICD-10-CM | POA: Diagnosis not present

## 2023-03-19 DIAGNOSIS — N186 End stage renal disease: Secondary | ICD-10-CM | POA: Diagnosis not present

## 2023-03-19 MED ORDER — MIDODRINE HCL 5 MG PO TABS
5.0000 mg | ORAL_TABLET | Freq: Two times a day (BID) | ORAL | 0 refills | Status: DC
  Filled 2023-03-19: qty 60, 30d supply, fill #0

## 2023-03-19 MED ORDER — CYANOCOBALAMIN 1000 MCG PO TABS
1000.0000 ug | ORAL_TABLET | Freq: Every day | ORAL | 0 refills | Status: DC
Start: 2023-03-19 — End: 2023-05-22
  Filled 2023-03-19: qty 30, 30d supply, fill #0

## 2023-03-19 NOTE — Progress Notes (Signed)
RT note. Patient not requiring bipap at this time. RT will continue to monitor.    03/19/23 0757  Therapy Vitals  Pulse Rate (!) 57  Resp 18  MEWS Score/Color  MEWS Score 0  MEWS Score Color Green  Oxygen Therapy/Pulse Ox  O2 Device Room Air  SpO2 94 %

## 2023-03-19 NOTE — Progress Notes (Signed)
Patient and niece provided discharge instructions to include follow up appointments and medications.

## 2023-03-19 NOTE — Progress Notes (Signed)
[facility] Patient Discharge Orders- [facility]: [facility]  Patient's name: [name] Admit/DC Dates: 03/14/2023 - 03/19/2023  Discharge Diagnoses: Acute hypoxic respiratory failure: Multifactorial =missed HD (vol ^), Multifocal PNA   Hypotension = Midodrine start Bradycardia = hold her Bystolic at time of discharge  Age-related debility: refused NHP  Aranesp: Given: no   Date and amount of last dose:  Last Hgb: 9.3 PRBC's Given: no Date/# of units: no ESA dose for discharge: mircera 120 mcg IV q 2 weeks next tx  IV Iron dose at discharge: no  Heparin change: no  EDW Change: yes New EDW: 69.0  [place] Change: no  Access intervention/Change: no Details:  Hectorol/Calcitriol change: no  Discharge Labs: Calcium8.4 Phosphorus 4.6 Albumin 2.4 K+ 3.9  IV Antibiotics: no Details:  On Coumadin?: no Last INR: Next INR: Managed By:   OTHER/APPTS/LAB ORDERS:    D/C Meds to be reconciled by nurse after every discharge.  Completed By:   Reviewed by: MD:______ RN_______

## 2023-03-19 NOTE — TOC Transition Note (Addendum)
Transition of Care [facility]) - CM/SW Discharge Note   Patient Details  Name: [name] MRN: [id number] Date of Birth: [birth year]  [facility] CM/SW Contact:  Lawerance Sabal, RN Phone Number: 03/19/2023, 8:26 AM   Clinical Narrative:     Rondel Jumbo of DC today. DME ordered yesterday    10:45 notified by nurse that the patient doesn't want WC, wants RW, [employer] will switch out DME    Final next level of care: Home w Home Health Services Barriers to Discharge: Continued Medical Work up   Patient Goals and CMS Choice CMS Medicare.gov Compare Post Acute Care list provided to:: Patient Represenative (must comment) (Niece) Choice offered to / list presented to :  (Niece)  Discharge Placement                         Discharge Plan and Services Additional resources added to the After Visit Summary for   In-house Referral: Clinical Social Work Discharge Planning Services: CM Consult Post Acute Care Choice: Durable Medical Equipment, Home Health          DME Arranged: Community education officer wheelchair with seat cushion DME Agency: [employer] Date DME Agency Contacted: 03/18/23 Time DME Agency Contacted: 1241 Representative spoke with at DME Agency: Vaughan Basta HH Arranged: PT, OT, Nurse's Aide, RN, Social Work [employer] Agency: [facility] Date [facility] Agency Contacted: 03/18/23 Time HH Agency Contacted: 1242 Representative spoke with at [facility] Agency: Kandee Keen  Social Determinants of Health (SDOH) Interventions SDOH Screenings   Food Insecurity: No Food Insecurity (03/15/2023)  Housing: Low Risk  (03/14/2023)  Transportation Needs: No Transportation Needs (03/14/2023)  Utilities: Not At Risk (03/14/2023)  Tobacco Use: Low Risk  (03/14/2023)     Readmission Risk Interventions     No data to display

## 2023-03-19 NOTE — Progress Notes (Signed)
Mobility Specialist Progress Note:    03/19/23 0900  Mobility  Activity Ambulated with assistance in hallway  Level of Assistance Standby assist, set-up cues, supervision of patient - no hands on  Assistive Device Front wheel walker  Distance Ambulated (ft) 100 ft  Activity Response Tolerated well  Mobility Referral Yes  $Mobility charge 1 Mobility  Mobility Specialist Start Time (ACUTE ONLY) 0900  Mobility Specialist Stop Time (ACUTE ONLY) 0911  Mobility Specialist Time Calculation (min) (ACUTE ONLY) 11 min   Pt received in chair, agreeable to mobility session. Ambulated in hallway with RW, SBA for safety. Tolerated well, returned pt to room, sitting up in chair comfortably with phone and call bell in reach. All needs met.   Feliciana Rossetti Mobility Specialist Please contact via Special educational needs teacher or  Rehab office at [phone]

## 2023-03-19 NOTE — Plan of Care (Signed)

## 2023-03-19 NOTE — Discharge Summary (Signed)
Physician Discharge Summary  [name] EPP:[id number] DOB: 22-Apr-1941 DOA: 03/14/2023  PCP: Gean Birchwood, [facility]  Admit date: 03/14/2023 Discharge date: 03/19/2023  Admitted From: (Home) Disposition:  (Home)  Recommendations for Outpatient Follow-up:  Follow up with PCP in 1-2 weeks Please obtain BMP/CBC in one week Please follow up on the following pending results:  Home Health: (YES)  Diet recommendation: Renal, with 1200 cc fluid restriction  Brief/Interim Summary:  This 82 y.o. female with medical history significant of ESRD on HD MWF schedule, hypertension, anemia of chronic disease, endometrial cancer status post abdominal hysterectomy, morbid obesity, secondary hyperparathyroidism presented to ED for evaluation for nausea, chest pain and shortness of breath. She was found to have O2 sat 60% room air and improved to 85% with CPAP. Patient reports she missed her dialysis today due to not being able to go, she also reports feeling better with BiPAP. She was subsequently transitioned to 5 L [place], tolerating well. Chest x-ray showing bilateral airspace disease concerning for multifocal pneumonia,  Patient was empirically started on ceftriaxone and Zithromax.  Nephrology was consulted and patient underwent hemodialysis.  Patient was admitted for acute hypoxic respiratory failure in the setting of volume overload from missing hemodialysis and multifocal pneumonia.    Acute hypoxic respiratory failure: Multifactorial Volume overload from missing hemodialysis: Multifocal pneumonia: EMS found SpO2 60% on room air requiring BiPAP this has improved, has much improved after dialysis, and pneumonia treatment, she is on room air on discharge. - Chest x-ray showed bilateral airspace opacity concerning for multifocal pneumonia.  She was treated with IV Rocephin and azithromycin during hospital stay, no further antibiotics needed at time of discharge -Blood cultures remain  negative  Volume overload due to missing hemodialysis. ESRD on hemodialysis. Chest x-ray showed pleural effusion and physical exam showed 2+ pitting edema in bilateral lower extremities. She did receive HD during hospital stay per renal, volume status has normalized at time of discharge.   Hypotension -Lactic acid within normal limit, he was started on midodrine during hospital stay, she will be discharged on low-dose 5 mg p.o. twice daily    Hyperkalemia: Patient was treated with calcium gluconate, insulin and dextrose. Lokelma given. Manage with hemodialysis. Has resolved   Essential hypertension: -Blood pressure has been low during hospital stay for which she required midodrine, blood pressure started to improve, so midodrine days has been decreased, she will be discharged on low-dose midodrine, but there is no room to continue any blood pressure medicine at this point, as well Bystolic has been discontinued specially with symptomatic bradycardia (it does not like she was taking it at home anyhow)    Bradycardia -Heart rate noted to be in the 40s when she sleeps, in the 50s when she wakes up, she is asymptomatic,  continue to hold her Bystolic at time of discharge   Anemia of chronic disease: Microcytic, following anemia panel B12 is borderline, so I will start on IM supplements.   Secondary hyperparathyroidism: Patient reports not taking PhosLo and Rocaltrol anymore.     Class 1 obesity -Body mass index is 30.1 kg/m.    Age-related debility: PT-OT consulted, recommendation for SNF, but family decides to take her home, she has good support at home with multiple family members helping, which I think it is is very appropriate for her to go home with [facility] which has been arranged by [facility]    Discharge Diagnoses:  Principal Problem:   Acute hypoxic respiratory failure (HCC) Active Problems:  ESRD on dialysis [facility])   Multifocal pneumonia   Anemia of chronic  disease   Hyperkalemia   Essential hypertension   Secondary hyperparathyroidism [facility])    Discharge Instructions  Discharge Instructions     Diet - low sodium heart healthy   Complete by: As directed    Discharge instructions   Complete by: As directed    Follow with [facility] in [facility] days   Get CBC, CMP,  checked  by Primary MD next visit.    Activity: As tolerated with Full fall precautions use walker/cane & assistance as needed   Disposition Home    Diet: Renal diet with 1200 cc fluid restrictions    On your next visit with your primary care physician please Get Medicines reviewed and adjusted.   Please request your Prim.MD to go over all Hospital Tests and Procedure/Radiological results at the follow up, please get all Hospital records sent to your Prim MD by signing hospital release before you go home.   If you experience worsening of your admission symptoms, develop shortness of breath, life threatening emergency, suicidal or homicidal thoughts you must seek medical attention immediately by calling 911 or calling your MD immediately  if symptoms less severe.  You Must read complete instructions/literature along with all the possible adverse reactions/side effects for all the Medicines you take and that have been prescribed to you. Take any new Medicines after you have completely understood and accpet all the possible adverse reactions/side effects.   Do not drive, operating heavy machinery, perform activities at heights, swimming or participation in water activities or provide baby sitting services if your were admitted for syncope or siezures until you have seen by Primary MD or a Neurologist and advised to do so again.  Do not drive when taking Pain medications.    Do not take more than prescribed Pain, Sleep and Anxiety Medications  Special Instructions: If you have smoked or chewed Tobacco  in the last 2 yrs please stop smoking,  stop any regular Alcohol  and or any Recreational drug use.  Wear Seat belts while driving.   Please note  You were cared for by a hospitalist during your hospital stay. If you have any questions about your discharge medications or the care you received while you were in the hospital after you are discharged, you can call the unit and asked to speak with the hospitalist on call if the hospitalist that took care of you is not available. Once you are discharged, your primary care physician will handle any further medical issues. Please note that NO REFILLS for any discharge medications will be authorized once you are discharged, as it is imperative that you return to your primary care physician (or establish a relationship with a primary care physician if you do not have one) for your aftercare needs so that they can reassess your need for medications and monitor your lab values.   Increase activity slowly   Complete by: As directed       Allergies as of 03/19/2023   No Known Allergies      Medication List     STOP taking these medications    nebivolol 10 MG tablet Commonly known as: BYSTOLIC       TAKE these medications    calcitRIOL 0.5 MCG capsule Commonly known as: ROCALTROL Take 1 capsule by mouth 2 (two) times daily.   calcium acetate 667 MG capsule Commonly known as: PHOSLO Take 1,334-2,001 mg  by mouth See admin instructions. 2001mg  three times daily with meals and 1334mg  with two snacks daily   cyanocobalamin 1000 MCG tablet Commonly known as: VITAMIN B12 Take 1 tablet (1,000 mcg total) by mouth daily.   DIALYVITE TABLET Tabs Take 1 tablet by mouth daily.   lidocaine-prilocaine cream Commonly known as: EMLA Apply 1 application topically daily as needed for pain.   midodrine 5 MG tablet Commonly known as: PROAMATINE Take 1 tablet (5 mg total) by mouth 2 (two) times daily with a meal.               Durable Medical Equipment  (From admission, onward)            Start     Ordered   03/18/23 1227  For home use only DME lightweight manual wheelchair with seat cushion  Once       Comments: Patient suffers from Weakness ESRD which impairs their ability to perform daily activities like toileting in the home.  A walker will not resolve  issue with performing activities of daily living. A wheelchair will allow patient to safely perform daily activities. Patient is not able to propel themselves in the home using a standard weight wheelchair due to general weakness. Patient can self propel in the lightweight wheelchair. Length of need Lifetime. Accessories: elevating leg rests (ELRs), wheel locks, extensions and anti-tippers.   03/18/23 1227            Follow-up Information     [facility] Follow up.   Why: agency for wheelchair        [facility] Follow up.   Specialty: Home Health Services Why: [facility] PT OT aide social work and RN they will see you this weekend Contact information: [address] [place] [address] [phone]                No Known Allergies  Consultations: Renal   Procedures/Studies: DG Chest Portable 1 View  Result Date: 03/14/2023 CLINICAL DATA:  Shortness of breath EXAM: PORTABLE CHEST 1 VIEW COMPARISON:  05/05/2022 FINDINGS: The cardiomediastinal silhouette is obscured. Aortic atherosclerotic calcification. Airspace opacities throughout the right lung and in the left mid and lower lung. Suspected bilateral pleural effusions. No pneumothorax. IMPRESSION: 1. Bilateral airspace opacities, right greater than left, concerning for multifocal pneumonia. Electronically Signed   By: Minerva Fester M.D.   On: 03/14/2023 19:38      Subjective:  No significant events overnight, she is eager to go home today denies dyspnea, denies cough, denies shortness of breath, tolerated HD yesterday.  Discharge Exam: Vitals:   03/19/23 0722 03/19/23 0757   BP:    Pulse:  (!) 57  Resp: 13 18  Temp:    SpO2: 94% 94%   Vitals:   03/19/23 0500 03/19/23 0600 03/19/23 0722 03/19/23 0757  BP:      Pulse: (!) 54 (!) 57  (!) 57  Resp: 12 16 13 18   Temp:      TempSrc:      SpO2: 92% 97% 94% 94%  Weight: 69.9 kg     Height:        General: Pt is alert, awake, not in acute distress, frail, pleasant Cardiovascular: RRR, S1/S2 +, no rubs, no gallops Respiratory: CTA bilaterally, no wheezing, no rhonchi Abdominal: Soft, NT, ND, bowel sounds + Extremities: no edema, no cyanosis    The results of significant diagnostics from this hospitalization (including imaging, microbiology,  ancillary and laboratory) are listed below for reference.     Microbiology: Recent Results (from the past 240 hour(s))  SARS Coronavirus 2 by RT PCR (hospital order, performed in [facility] hospital lab) *cepheid single result test* Anterior Nasal Swab     Status: None   Collection Time: 03/14/23  4:29 PM   Specimen: Anterior Nasal Swab  Result Value Ref Range Status   SARS Coronavirus 2 by RT PCR NEGATIVE NEGATIVE Final    Comment: Performed at [facility] Lab, [address], [place], [place] [address]  Culture, blood (routine x 2) Call MD if unable to obtain prior to antibiotics being given     Status: None (Preliminary result)   Collection Time: 03/14/23 11:53 PM   Specimen: BLOOD RIGHT HAND  Result Value Ref Range Status   Specimen Description BLOOD RIGHT HAND  Final   Special Requests   Final    BOTTLES DRAWN AEROBIC AND ANAEROBIC Blood Culture adequate volume   Culture   Final    NO GROWTH 4 DAYS Performed at [facility] Lab, [address], [place], [place] [address]    Report Status PENDING  Incomplete  Culture, blood (routine x 2) Call MD if unable to obtain prior to antibiotics being given     Status: None (Preliminary result)   Collection Time: 03/14/23 11:55 PM   Specimen: BLOOD RIGHT WRIST  Result Value Ref Range Status   Specimen  Description BLOOD RIGHT WRIST  Final   Special Requests   Final    BOTTLES DRAWN AEROBIC AND ANAEROBIC Blood Culture results may not be optimal due to an excessive volume of blood received in culture bottles   Culture   Final    NO GROWTH 4 DAYS Performed at [facility] Lab, [address], [place], [place] [address]    Report Status PENDING  Incomplete  Respiratory (~20 pathogens) panel by PCR     Status: None   Collection Time: 03/14/23 11:55 PM   Specimen: Nasopharyngeal Swab; Respiratory  Result Value Ref Range Status   Adenovirus NOT DETECTED NOT DETECTED Final   Coronavirus 229E NOT DETECTED NOT DETECTED Final    Comment: (NOTE) The Coronavirus on the Respiratory Panel, DOES NOT test for the novel  Coronavirus (2019 nCoV)    Coronavirus HKU1 NOT DETECTED NOT DETECTED Final   Coronavirus NL63 NOT DETECTED NOT DETECTED Final   Coronavirus OC43 NOT DETECTED NOT DETECTED Final   Metapneumovirus NOT DETECTED NOT DETECTED Final   Rhinovirus / Enterovirus NOT DETECTED NOT DETECTED Final   Influenza A NOT DETECTED NOT DETECTED Final   Influenza B NOT DETECTED NOT DETECTED Final   Parainfluenza Virus 1 NOT DETECTED NOT DETECTED Final   Parainfluenza Virus 2 NOT DETECTED NOT DETECTED Final   Parainfluenza Virus 3 NOT DETECTED NOT DETECTED Final   Parainfluenza Virus 4 NOT DETECTED NOT DETECTED Final   Respiratory Syncytial Virus NOT DETECTED NOT DETECTED Final   Bordetella pertussis NOT DETECTED NOT DETECTED Final   Bordetella Parapertussis NOT DETECTED NOT DETECTED Final   Chlamydophila pneumoniae NOT DETECTED NOT DETECTED Final   Mycoplasma pneumoniae NOT DETECTED NOT DETECTED Final    Comment: Performed at [facility] Lab, [address] [place], [place] [address]  MRSA Next Gen by PCR, Nasal     Status: None   Collection Time: 03/15/23  6:00 PM   Specimen: Nasal Mucosa; Nasal Swab  Result Value Ref Range Status   MRSA by PCR Next  Gen NOT DETECTED NOT DETECTED Final     Comment: (NOTE) The GeneXpert MRSA Assay (FDA approved for NASAL specimens only), is one component of a comprehensive MRSA colonization surveillance program. It is not intended to diagnose MRSA infection nor to guide or monitor treatment for MRSA infections. Test performance is not FDA approved in patients less than 12 years old. Performed at [facility] Lab, [address] [place], [place] [address]   Expectorated Sputum Assessment w Gram Stain, Rflx to Resp Cult     Status: None (Preliminary result)   Collection Time: 03/16/23  2:08 AM   Specimen: Expectorated Sputum  Result Value Ref Range Status   Specimen Description EXPECTORATED SPUTUM  Final   Special Requests Immunocompromised  Final   Sputum evaluation   Final    Sputum specimen not acceptable for testing.  Please recollect.   Performed at [facility] Lab, [address] [place], [place] [address]    Report Status PENDING  Incomplete     Labs: BNP (last 3 results) No results for input(s): "BNP" in the last 8760 hours. Basic Metabolic Panel: Recent Labs  Lab 03/14/23 1807 03/14/23 1853 03/15/23 2952 03/15/23 0752 03/16/23 0255 03/17/23 0528 03/18/23 0314  NA 140   < > 134* 138 134* 129* 135  K 6.0*   < > 4.9 5.5* 3.9 4.0 3.9  CL 103  --   --  96* 94* 91* 95*  CO2 15*  --   --  25 27 24 26   GLUCOSE 118*  --   --  83 132* 87 85  BUN 47*  --   --  55* 29* 51* 37*  CREATININE 10.57*  --   --  11.04* 7.28* 9.45* 7.24*  CALCIUM 9.6  --   --  9.6 8.2* 8.2* 8.4*  MG  --   --   --   --  1.6*  --   --   PHOS  --   --   --  5.0* 4.6  --   --    < > = values in this interval not displayed.   Liver Function Tests: Recent Labs  Lab 03/14/23 1807 03/15/23 0752 03/16/23 0255 03/17/23 0528 03/18/23 0314  AST 52* 47* 39 41 30  ALT 30 33 29 34 29  ALKPHOS 72 63 52 61 61  BILITOT 1.3* 1.4* 1.3* 1.1 0.7  PROT 6.8 6.4* 5.3* 6.0* 5.5*  ALBUMIN 3.4* 3.0* 2.5* 2.5* 2.4*   No results for input(s): "LIPASE",  "AMYLASE" in the last 168 hours. No results for input(s): "AMMONIA" in the last 168 hours. CBC: Recent Labs  Lab 03/14/23 1850 03/14/23 1853 03/14/23 2353 03/15/23 8413 03/15/23 0752 03/16/23 0255 03/17/23 0528 03/18/23 0314  WBC 10.2  --  9.2  --  8.8 7.8 5.6 4.3  NEUTROABS 8.2*  --   --   --   --   --   --   --   HGB 11.1*   < > 11.1* 10.9* 11.1* 9.1* 9.4* 9.3*  HCT 35.0*   < > 33.9* 32.0* 34.0* 27.5* 28.0* 28.6*  MCV 105.7*  --  106.3*  --  107.3* 102.6* 103.3* 102.5*  PLT 102*  --  108*  --  126* 84* 103* 127*   < > = values in this interval not displayed.   Cardiac Enzymes: No results for input(s): "CKTOTAL", "CKMB", "CKMBINDEX", "TROPONINI" in the last 168 hours. BNP: Invalid input(s): "POCBNP" CBG: Recent Labs  Lab 03/15/23  0018  GLUCAP 104*   D-Dimer No results for input(s): "DDIMER" in the last 72 hours. Hgb A1c No results for input(s): "HGBA1C" in the last 72 hours. Lipid Profile No results for input(s): "CHOL", "HDL", "LDLCALC", "TRIG", "CHOLHDL", "LDLDIRECT" in the last 72 hours. Thyroid function studies No results for input(s): "TSH", "T4TOTAL", "T3FREE", "THYROIDAB" in the last 72 hours.  Invalid input(s): "FREET3" Anemia work up No results for input(s): "VITAMINB12", "FOLATE", "FERRITIN", "TIBC", "IRON", "RETICCTPCT" in the last 72 hours. Urinalysis No results found for: "COLORURINE", "APPEARANCEUR", "LABSPEC", "PHURINE", "GLUCOSEU", "HGBUR", "BILIRUBINUR", "KETONESUR", "PROTEINUR", "UROBILINOGEN", "NITRITE", "LEUKOCYTESUR" Sepsis Labs Recent Labs  Lab 03/15/23 0752 03/16/23 0255 03/17/23 0528 03/18/23 0314  WBC 8.8 7.8 5.6 4.3   Microbiology Recent Results (from the past 240 hour(s))  SARS Coronavirus 2 by RT PCR (hospital order, performed in [facility] hospital lab) *cepheid single result test* Anterior Nasal Swab     Status: None   Collection Time: 03/14/23  4:29 PM   Specimen: Anterior Nasal Swab  Result Value Ref Range Status   SARS  Coronavirus 2 by RT PCR NEGATIVE NEGATIVE Final    Comment: Performed at [facility] Lab, [address] [place], [place] [address]  Culture, blood (routine x 2) Call MD if unable to obtain prior to antibiotics being given     Status: None (Preliminary result)   Collection Time: 03/14/23 11:53 PM   Specimen: BLOOD RIGHT HAND  Result Value Ref Range Status   Specimen Description BLOOD RIGHT HAND  Final   Special Requests   Final    BOTTLES DRAWN AEROBIC AND ANAEROBIC Blood Culture adequate volume   Culture   Final    NO GROWTH 4 DAYS Performed at [facility] Lab, [address] [place], [place] [address]    Report Status PENDING  Incomplete  Culture, blood (routine x 2) Call MD if unable to obtain prior to antibiotics being given     Status: None (Preliminary result)   Collection Time: 03/14/23 11:55 PM   Specimen: BLOOD RIGHT WRIST  Result Value Ref Range Status   Specimen Description BLOOD RIGHT WRIST  Final   Special Requests   Final    BOTTLES DRAWN AEROBIC AND ANAEROBIC Blood Culture results may not be optimal due to an excessive volume of blood received in culture bottles   Culture   Final    NO GROWTH 4 DAYS Performed at [facility] Lab, [address], [place], [place] [address]    Report Status PENDING  Incomplete  Respiratory (~20 pathogens) panel by PCR     Status: None   Collection Time: 03/14/23 11:55 PM   Specimen: Nasopharyngeal Swab; Respiratory  Result Value Ref Range Status   Adenovirus NOT DETECTED NOT DETECTED Final   Coronavirus 229E NOT DETECTED NOT DETECTED Final    Comment: (NOTE) The Coronavirus on the Respiratory Panel, DOES NOT test for the novel  Coronavirus (2019 nCoV)    Coronavirus HKU1 NOT DETECTED NOT DETECTED Final   Coronavirus NL63 NOT DETECTED NOT DETECTED Final   Coronavirus OC43 NOT DETECTED NOT DETECTED Final   Metapneumovirus NOT DETECTED NOT DETECTED Final   Rhinovirus / Enterovirus NOT DETECTED NOT DETECTED Final    Influenza A NOT DETECTED NOT DETECTED Final   Influenza B NOT DETECTED NOT DETECTED Final   Parainfluenza Virus 1 NOT DETECTED NOT DETECTED Final   Parainfluenza Virus 2 NOT DETECTED NOT DETECTED Final   Parainfluenza Virus 3 NOT DETECTED NOT DETECTED Final  Parainfluenza Virus 4 NOT DETECTED NOT DETECTED Final   Respiratory Syncytial Virus NOT DETECTED NOT DETECTED Final   Bordetella pertussis NOT DETECTED NOT DETECTED Final   Bordetella Parapertussis NOT DETECTED NOT DETECTED Final   Chlamydophila pneumoniae NOT DETECTED NOT DETECTED Final   Mycoplasma pneumoniae NOT DETECTED NOT DETECTED Final    Comment: Performed at [facility] Lab, [address] [place], [place] [address]  MRSA Next Gen by PCR, Nasal     Status: None   Collection Time: 03/15/23  6:00 PM   Specimen: Nasal Mucosa; Nasal Swab  Result Value Ref Range Status   MRSA by PCR Next Gen NOT DETECTED NOT DETECTED Final    Comment: (NOTE) The GeneXpert MRSA Assay (FDA approved for NASAL specimens only), is one component of a comprehensive MRSA colonization surveillance program. It is not intended to diagnose MRSA infection nor to guide or monitor treatment for MRSA infections. Test performance is not FDA approved in patients less than 62 years old. Performed at [facility] Lab, [address] [place], [place] [address]   Expectorated Sputum Assessment w Gram Stain, Rflx to Resp Cult     Status: None (Preliminary result)   Collection Time: 03/16/23  2:08 AM   Specimen: Expectorated Sputum  Result Value Ref Range Status   Specimen Description EXPECTORATED SPUTUM  Final   Special Requests Immunocompromised  Final   Sputum evaluation   Final    Sputum specimen not acceptable for testing.  Please recollect.   Performed at [facility] Lab, [address] [place], [place] [address]    Report Status PENDING  Incomplete     Time coordinating discharge: Over 30 minutes  SIGNED:   Huey Bienenstock, MD  [facility] 03/19/2023, 9:45 AM Pager   If 7PM-7AM, please contact night-coverage [email or web address]

## 2023-03-20 ENCOUNTER — Telehealth: Payer: Self-pay | Admitting: Nephrology

## 2023-03-20 LAB — CULTURE, BLOOD (ROUTINE X 2)
Culture: NO GROWTH
Culture: NO GROWTH
Special Requests: ADEQUATE

## 2023-03-20 NOTE — Telephone Encounter (Signed)
Transition of care contact from inpatient facility  Date of Discharge: 03/19/23 Date of Contact: attempted 03/20/23  Method of contact: Phone  Attempted to contact patient to discuss transition of care from inpatient admission. Patient did not answer the phone.  No Message was left  no answering machine  will have fu at [facility]

## 2023-03-21 NOTE — Progress Notes (Signed)
Late Note Entry- Nov. 18, 2024  Pt was d/c on Saturday. Contacted [facility] this morning to advise clinic of pt's d/c date and that pt should resume care today.   Olivia Canter Renal Navigator [phone]

## 2023-03-23 LAB — EXPECTORATED SPUTUM ASSESSMENT W GRAM STAIN, RFLX TO RESP C

## 2023-03-25 ENCOUNTER — Other Ambulatory Visit (HOSPITAL_COMMUNITY): Payer: Self-pay

## 2023-04-22 NOTE — Progress Notes (Deleted)
Referring-[facility] Reason for referral-bradycardia  HPI: 82 year old female for evaluation of bradycardia at request of [facility].  Echocardiogram February 2020 showed normal LV function, moderate left ventricular hypertrophy, mild aortic stenosis but mean gradient calculated 8 mmHg.  Laboratories November 2024 showed hemoglobin 9.3, potassium 3.9.  Patient recently found herself to be bradycardic with heart rate of and cardiology asked to evaluate.  Current Outpatient Medications  Medication Sig Dispense Refill   B Complex-C-Folic Acid (DIALYVITE TABLET) TABS Take 1 tablet by mouth daily. (Patient not taking: Reported on 03/14/2023)     calcitRIOL (ROCALTROL) 0.5 MCG capsule Take 1 capsule by mouth 2 (two) times daily. (Patient not taking: Reported on 03/14/2023)  3   calcium acetate (PHOSLO) 667 MG capsule Take 1,334-2,001 mg by mouth See admin instructions. 2001mg  three times daily with meals and 1334mg  with two snacks daily (Patient not taking: Reported on 03/14/2023)  6   cyanocobalamin 1000 MCG tablet Take 1 tablet (1,000 mcg total) by mouth daily. 30 tablet 0   lidocaine-prilocaine (EMLA) cream Apply 1 application topically daily as needed for pain.     midodrine (PROAMATINE) 5 MG tablet Take 1 tablet (5 mg total) by mouth 2 (two) times daily with a meal. 60 tablet 0   No current facility-administered medications for this visit.    No Known Allergies   Past Medical History:  Diagnosis Date   Anemia    Arthritis    Cancer (HCC)    urterine- surgical removal only tratment   Chronic kidney disease    end stage 4   Environmental and seasonal allergies    affects sinuses   Heart murmur    no one has mentioned it lately- had it as a child   Hypertension    Limb cramps    leg, thigh, hands   Pneumonia    "young"   Shortness of breath dyspnea    With exerion    Past Surgical History:  Procedure Laterality Date   ABDOMINAL  HYSTERECTOMY     complete   AV FISTULA PLACEMENT Left 01/23/2016   Procedure: LEFT RADIOCEPHALIC  ARTERIOVENOUS (AV) FISTULA CREATION;  Surgeon: Nada Libman, MD;  Location: [facility] OR;  Service: Vascular;  Laterality: Left;   AV FISTULA PLACEMENT Left 03/09/2016   Procedure: ARTERIOVENOUS (AV) FISTULA CREATION-LEFT UPPER ARM;  Surgeon: Fransisco Hertz, MD;  Location: [facility] OR;  Service: Vascular;  Laterality: Left;   BREAST SURGERY Bilateral    4 surgeries, Cyst in each breast   COLONOSCOPY W/ POLYPECTOMY     DILATION AND CURETTAGE OF UTERUS     INSERTION OF DIALYSIS CATHETER N/A 03/09/2016   Procedure: INSERTION OF DIALYSIS CATHETER;  Surgeon: Fransisco Hertz, MD;  Location: [facility] OR;  Service: Vascular;  Laterality: N/A;    Social History   Socioeconomic History   Marital status: Single    Spouse name: Not on file   Number of children: Not on file   Years of education: Not on file   Highest education level: Not on file  Occupational History   Not on file  Tobacco Use   Smoking status: Never   Smokeless tobacco: Never  Vaping Use   Vaping status: Never Used  Substance and Sexual Activity   Alcohol use: No   Drug use: No   Sexual activity: Not on file  Other Topics Concern   Not on file  Social History Narrative   Not on file   Social  Drivers of Corporate investment banker Strain: Not on file  Food Insecurity: No Food Insecurity (03/15/2023)   Hunger Vital Sign    Worried About Running Out of Food in the Last Year: Never true    Ran Out of Food in the Last Year: Never true  Transportation Needs: No Transportation Needs (03/14/2023)   PRAPARE - Administrator, Civil Service (Medical): No    Lack of Transportation (Non-Medical): No  Physical Activity: Not on file  Stress: Not on file  Social Connections: Not on file  Intimate Partner Violence: Not At Risk (03/14/2023)   Humiliation, Afraid, Rape, and Kick questionnaire    Fear of Current or Ex-Partner: No     Emotionally Abused: No    Physically Abused: No    Sexually Abused: No    Family History  Problem Relation Age of Onset   Cancer - Lung Mother    Renal Disease Father    Heart attack Father     ROS: no fevers or chills, productive cough, hemoptysis, dysphasia, odynophagia, melena, hematochezia, dysuria, hematuria, rash, seizure activity, orthopnea, PND, pedal edema, claudication. Remaining systems are negative.  Physical Exam:   There were no vitals taken for this visit.  General:  Well developed/well nourished in NAD Skin warm/dry Patient not depressed No peripheral clubbing Back-normal HEENT-normal/normal eyelids Neck supple/normal carotid upstroke bilaterally; no bruits; no JVD; no thyromegaly chest - CTA/ normal expansion CV - RRR/normal S1 and S2; no murmurs, rubs or gallops;  PMI nondisplaced Abdomen -NT/ND, no HSM, no mass, + bowel sounds, no bruit 2+ femoral pulses, no bruits Ext-no edema, chords, 2+ DP Neuro-grossly nonfocal  ECG -March 14, 2023-normal sinus rhythm, cannot rule out septal infarct, nonspecific ST changes.  Personally reviewed  A/P  1 bradycardia-  2 Question aortic stenosis-  3 hypertension-  4 end-stage renal disease-Per nephrology.  Olga Millers, MD

## 2023-04-28 ENCOUNTER — Ambulatory Visit: Payer: Medicare PPO | Admitting: Cardiology

## 2023-05-12 ENCOUNTER — Emergency Department (HOSPITAL_COMMUNITY): Payer: Medicare PPO

## 2023-05-12 ENCOUNTER — Inpatient Hospital Stay (HOSPITAL_COMMUNITY)
Admission: EM | Admit: 2023-05-12 | Discharge: 2023-06-04 | DRG: 208 | Disposition: E | Payer: Medicare PPO | Attending: Internal Medicine | Admitting: Internal Medicine

## 2023-05-12 ENCOUNTER — Inpatient Hospital Stay (HOSPITAL_COMMUNITY): Payer: Medicare PPO

## 2023-05-12 ENCOUNTER — Other Ambulatory Visit: Payer: Self-pay

## 2023-05-12 ENCOUNTER — Encounter (HOSPITAL_COMMUNITY): Payer: Self-pay | Admitting: Internal Medicine

## 2023-05-12 DIAGNOSIS — R6521 Severe sepsis with septic shock: Secondary | ICD-10-CM | POA: Diagnosis present

## 2023-05-12 DIAGNOSIS — Z7189 Other specified counseling: Secondary | ICD-10-CM

## 2023-05-12 DIAGNOSIS — Z79899 Other long term (current) drug therapy: Secondary | ICD-10-CM

## 2023-05-12 DIAGNOSIS — I351 Nonrheumatic aortic (valve) insufficiency: Secondary | ICD-10-CM | POA: Diagnosis present

## 2023-05-12 DIAGNOSIS — N186 End stage renal disease: Secondary | ICD-10-CM | POA: Diagnosis present

## 2023-05-12 DIAGNOSIS — Z992 Dependence on renal dialysis: Secondary | ICD-10-CM

## 2023-05-12 DIAGNOSIS — I469 Cardiac arrest, cause unspecified: Secondary | ICD-10-CM | POA: Diagnosis not present

## 2023-05-12 DIAGNOSIS — R0902 Hypoxemia: Secondary | ICD-10-CM

## 2023-05-12 DIAGNOSIS — I350 Nonrheumatic aortic (valve) stenosis: Secondary | ICD-10-CM

## 2023-05-12 DIAGNOSIS — J9602 Acute respiratory failure with hypercapnia: Secondary | ICD-10-CM | POA: Diagnosis present

## 2023-05-12 DIAGNOSIS — J811 Chronic pulmonary edema: Secondary | ICD-10-CM | POA: Insufficient documentation

## 2023-05-12 DIAGNOSIS — I3139 Other pericardial effusion (noninflammatory): Secondary | ICD-10-CM | POA: Diagnosis present

## 2023-05-12 DIAGNOSIS — R001 Bradycardia, unspecified: Secondary | ICD-10-CM | POA: Diagnosis not present

## 2023-05-12 DIAGNOSIS — I352 Nonrheumatic aortic (valve) stenosis with insufficiency: Secondary | ICD-10-CM | POA: Diagnosis present

## 2023-05-12 DIAGNOSIS — M25562 Pain in left knee: Secondary | ICD-10-CM | POA: Insufficient documentation

## 2023-05-12 DIAGNOSIS — Y92009 Unspecified place in unspecified non-institutional (private) residence as the place of occurrence of the external cause: Secondary | ICD-10-CM

## 2023-05-12 DIAGNOSIS — I339 Acute and subacute endocarditis, unspecified: Secondary | ICD-10-CM | POA: Diagnosis not present

## 2023-05-12 DIAGNOSIS — Z8542 Personal history of malignant neoplasm of other parts of uterus: Secondary | ICD-10-CM

## 2023-05-12 DIAGNOSIS — R0602 Shortness of breath: Principal | ICD-10-CM

## 2023-05-12 DIAGNOSIS — I519 Heart disease, unspecified: Secondary | ICD-10-CM | POA: Insufficient documentation

## 2023-05-12 DIAGNOSIS — D631 Anemia in chronic kidney disease: Secondary | ICD-10-CM | POA: Diagnosis present

## 2023-05-12 DIAGNOSIS — Z515 Encounter for palliative care: Secondary | ICD-10-CM | POA: Diagnosis not present

## 2023-05-12 DIAGNOSIS — I132 Hypertensive heart and chronic kidney disease with heart failure and with stage 5 chronic kidney disease, or end stage renal disease: Secondary | ICD-10-CM | POA: Diagnosis present

## 2023-05-12 DIAGNOSIS — I2489 Other forms of acute ischemic heart disease: Secondary | ICD-10-CM | POA: Diagnosis present

## 2023-05-12 DIAGNOSIS — I5031 Acute diastolic (congestive) heart failure: Secondary | ICD-10-CM | POA: Insufficient documentation

## 2023-05-12 DIAGNOSIS — Z90710 Acquired absence of both cervix and uterus: Secondary | ICD-10-CM

## 2023-05-12 DIAGNOSIS — Z8249 Family history of ischemic heart disease and other diseases of the circulatory system: Secondary | ICD-10-CM | POA: Diagnosis not present

## 2023-05-12 DIAGNOSIS — J9690 Respiratory failure, unspecified, unspecified whether with hypoxia or hypercapnia: Secondary | ICD-10-CM | POA: Diagnosis present

## 2023-05-12 DIAGNOSIS — R54 Age-related physical debility: Secondary | ICD-10-CM | POA: Diagnosis present

## 2023-05-12 DIAGNOSIS — I160 Hypertensive urgency: Secondary | ICD-10-CM | POA: Diagnosis present

## 2023-05-12 DIAGNOSIS — D638 Anemia in other chronic diseases classified elsewhere: Secondary | ICD-10-CM | POA: Diagnosis present

## 2023-05-12 DIAGNOSIS — W19XXXA Unspecified fall, initial encounter: Secondary | ICD-10-CM

## 2023-05-12 DIAGNOSIS — N2581 Secondary hyperparathyroidism of renal origin: Secondary | ICD-10-CM | POA: Diagnosis present

## 2023-05-12 DIAGNOSIS — D6489 Other specified anemias: Secondary | ICD-10-CM | POA: Diagnosis present

## 2023-05-12 DIAGNOSIS — E875 Hyperkalemia: Secondary | ICD-10-CM | POA: Diagnosis present

## 2023-05-12 DIAGNOSIS — A419 Sepsis, unspecified organism: Secondary | ICD-10-CM | POA: Diagnosis present

## 2023-05-12 DIAGNOSIS — I9589 Other hypotension: Secondary | ICD-10-CM | POA: Diagnosis present

## 2023-05-12 DIAGNOSIS — J9601 Acute respiratory failure with hypoxia: Secondary | ICD-10-CM | POA: Diagnosis not present

## 2023-05-12 DIAGNOSIS — D6959 Other secondary thrombocytopenia: Secondary | ICD-10-CM | POA: Diagnosis present

## 2023-05-12 DIAGNOSIS — J690 Pneumonitis due to inhalation of food and vomit: Secondary | ICD-10-CM | POA: Diagnosis present

## 2023-05-12 DIAGNOSIS — Z66 Do not resuscitate: Secondary | ICD-10-CM | POA: Diagnosis not present

## 2023-05-12 DIAGNOSIS — I5033 Acute on chronic diastolic (congestive) heart failure: Secondary | ICD-10-CM | POA: Diagnosis present

## 2023-05-12 DIAGNOSIS — D539 Nutritional anemia, unspecified: Secondary | ICD-10-CM | POA: Diagnosis present

## 2023-05-12 DIAGNOSIS — E8770 Fluid overload, unspecified: Secondary | ICD-10-CM | POA: Diagnosis present

## 2023-05-12 DIAGNOSIS — R5381 Other malaise: Secondary | ICD-10-CM | POA: Diagnosis present

## 2023-05-12 DIAGNOSIS — J189 Pneumonia, unspecified organism: Secondary | ICD-10-CM | POA: Diagnosis present

## 2023-05-12 DIAGNOSIS — R112 Nausea with vomiting, unspecified: Secondary | ICD-10-CM

## 2023-05-12 DIAGNOSIS — D696 Thrombocytopenia, unspecified: Secondary | ICD-10-CM | POA: Diagnosis present

## 2023-05-12 DIAGNOSIS — I5030 Unspecified diastolic (congestive) heart failure: Secondary | ICD-10-CM | POA: Diagnosis present

## 2023-05-12 DIAGNOSIS — Z841 Family history of disorders of kidney and ureter: Secondary | ICD-10-CM

## 2023-05-12 HISTORY — DX: End stage renal disease: Z99.2

## 2023-05-12 HISTORY — DX: End stage renal disease: N18.6

## 2023-05-12 LAB — COMPREHENSIVE METABOLIC PANEL
ALT: 28 U/L (ref 0–44)
AST: 21 U/L (ref 15–41)
Albumin: 3.6 g/dL (ref 3.5–5.0)
Alkaline Phosphatase: 62 U/L (ref 38–126)
Anion gap: 15 (ref 5–15)
BUN: 63 mg/dL — ABNORMAL HIGH (ref 8–23)
CO2: 26 mmol/L (ref 22–32)
Calcium: 9.4 mg/dL (ref 8.9–10.3)
Chloride: 96 mmol/L — ABNORMAL LOW (ref 98–111)
Creatinine, Ser: 10.99 mg/dL — ABNORMAL HIGH (ref 0.44–1.00)
GFR, Estimated: 3 mL/min — ABNORMAL LOW (ref >60)
Glucose, Bld: 110 mg/dL — ABNORMAL HIGH (ref 70–99)
Potassium: 4.9 mmol/L (ref 3.5–5.1)
Sodium: 137 mmol/L (ref 135–145)
Total Bilirubin: 1 mg/dL (ref 0.0–1.2)
Total Protein: 7.1 g/dL (ref 6.5–8.1)

## 2023-05-12 LAB — CBC
HCT: 37.4 % (ref 36.0–46.0)
Hemoglobin: 12.4 g/dL (ref 12.0–15.0)
MCH: 34.1 pg — ABNORMAL HIGH (ref 26.0–34.0)
MCHC: 33.2 g/dL (ref 30.0–36.0)
MCV: 102.7 fL — ABNORMAL HIGH (ref 80.0–100.0)
Platelets: 112 10*3/uL — ABNORMAL LOW (ref 150–400)
RBC: 3.64 MIL/uL — ABNORMAL LOW (ref 3.87–5.11)
RDW: 13.2 % (ref 11.5–15.5)
WBC: 9.3 10*3/uL (ref 4.0–10.5)
nRBC: 0 % (ref 0.0–0.2)

## 2023-05-12 LAB — POCT I-STAT 7, (LYTES, BLD GAS, ICA,H+H)
Acid-Base Excess: 1 mmol/L (ref 0.0–2.0)
Bicarbonate: 29.3 mmol/L — ABNORMAL HIGH (ref 20.0–28.0)
Calcium, Ion: 1.17 mmol/L (ref 1.15–1.40)
HCT: 36 % (ref 36.0–46.0)
Hemoglobin: 12.2 g/dL (ref 12.0–15.0)
O2 Saturation: 97 %
Patient temperature: 96.9
Potassium: 4.1 mmol/L (ref 3.5–5.1)
Sodium: 136 mmol/L (ref 135–145)
TCO2: 31 mmol/L (ref 22–32)
pCO2 arterial: 64.9 mm[Hg] — ABNORMAL HIGH (ref 32–48)
pH, Arterial: 7.258 — ABNORMAL LOW (ref 7.35–7.45)
pO2, Arterial: 104 mm[Hg] (ref 83–108)

## 2023-05-12 LAB — CBC WITH DIFFERENTIAL/PLATELET
Abs Immature Granulocytes: 0.04 10*3/uL (ref 0.00–0.07)
Basophils Absolute: 0 10*3/uL (ref 0.0–0.1)
Basophils Relative: 0 %
Eosinophils Absolute: 0 10*3/uL (ref 0.0–0.5)
Eosinophils Relative: 0 %
HCT: 20 % — ABNORMAL LOW (ref 36.0–46.0)
Hemoglobin: 6.6 g/dL — CL (ref 12.0–15.0)
Immature Granulocytes: 0 %
Lymphocytes Relative: 14 %
Lymphs Abs: 1.3 10*3/uL (ref 0.7–4.0)
MCH: 34.4 pg — ABNORMAL HIGH (ref 26.0–34.0)
MCHC: 33 g/dL (ref 30.0–36.0)
MCV: 104.2 fL — ABNORMAL HIGH (ref 80.0–100.0)
Monocytes Absolute: 1.5 10*3/uL — ABNORMAL HIGH (ref 0.1–1.0)
Monocytes Relative: 16 %
Neutro Abs: 6.3 10*3/uL (ref 1.7–7.7)
Neutrophils Relative %: 70 %
Platelets: 144 10*3/uL — ABNORMAL LOW (ref 150–400)
RBC: 1.92 MIL/uL — ABNORMAL LOW (ref 3.87–5.11)
RDW: 13.2 % (ref 11.5–15.5)
WBC: 9.1 10*3/uL (ref 4.0–10.5)
nRBC: 0 % (ref 0.0–0.2)

## 2023-05-12 LAB — PREPARE RBC (CROSSMATCH)

## 2023-05-12 LAB — I-STAT ARTERIAL BLOOD GAS, ED
Acid-base deficit: 1 mmol/L (ref 0.0–2.0)
Bicarbonate: 30.7 mmol/L — ABNORMAL HIGH (ref 20.0–28.0)
Calcium, Ion: 1.2 mmol/L (ref 1.15–1.40)
HCT: 34 % — ABNORMAL LOW (ref 36.0–46.0)
Hemoglobin: 11.6 g/dL — ABNORMAL LOW (ref 12.0–15.0)
O2 Saturation: 95 %
Patient temperature: 98.6
Potassium: 4.6 mmol/L (ref 3.5–5.1)
Sodium: 136 mmol/L (ref 135–145)
TCO2: 34 mmol/L — ABNORMAL HIGH (ref 22–32)
pCO2 arterial: 96.7 mm[Hg] (ref 32–48)
pH, Arterial: 7.11 — CL (ref 7.35–7.45)
pO2, Arterial: 106 mm[Hg] (ref 83–108)

## 2023-05-12 LAB — GLUCOSE, CAPILLARY
Glucose-Capillary: 115 mg/dL — ABNORMAL HIGH (ref 70–99)
Glucose-Capillary: 143 mg/dL — ABNORMAL HIGH (ref 70–99)
Glucose-Capillary: 95 mg/dL (ref 70–99)
Glucose-Capillary: 99 mg/dL (ref 70–99)

## 2023-05-12 LAB — APTT: aPTT: 29 s (ref 24–36)

## 2023-05-12 LAB — HEPATITIS B SURFACE ANTIGEN: Hepatitis B Surface Ag: NONREACTIVE

## 2023-05-12 LAB — ECHOCARDIOGRAM COMPLETE
AV Mean grad: 34.7 mm[Hg]
AV Peak grad: 55.8 mm[Hg]
Ao pk vel: 3.73 m/s
Area-P 1/2: 3.17 cm2
Height: 60 in
S' Lateral: 2.4 cm
Weight: 2624.36 [oz_av]

## 2023-05-12 LAB — CREATININE, SERUM
Creatinine, Ser: 11.29 mg/dL — ABNORMAL HIGH (ref 0.44–1.00)
GFR, Estimated: 3 mL/min — ABNORMAL LOW (ref >60)

## 2023-05-12 LAB — PROTIME-INR
INR: 1.1 (ref 0.8–1.2)
Prothrombin Time: 14.4 s (ref 11.4–15.2)

## 2023-05-12 LAB — TROPONIN I (HIGH SENSITIVITY): Troponin I (High Sensitivity): 264 ng/L (ref <18)

## 2023-05-12 LAB — PHOSPHORUS: Phosphorus: 9.2 mg/dL — ABNORMAL HIGH (ref 2.5–4.6)

## 2023-05-12 LAB — RESP PANEL BY RT-PCR (RSV, FLU A&B, COVID)  RVPGX2
Influenza A by PCR: NEGATIVE
Influenza B by PCR: NEGATIVE
Resp Syncytial Virus by PCR: NEGATIVE
SARS Coronavirus 2 by RT PCR: NEGATIVE

## 2023-05-12 LAB — MRSA NEXT GEN BY PCR, NASAL: MRSA by PCR Next Gen: NOT DETECTED

## 2023-05-12 MED ORDER — CHLORHEXIDINE GLUCONATE CLOTH 2 % EX PADS
6.0000 | MEDICATED_PAD | Freq: Every day | CUTANEOUS | Status: DC
Start: 2023-05-12 — End: 2023-05-13
  Administered 2023-05-12 – 2023-05-13 (×2): 6 via TOPICAL

## 2023-05-12 MED ORDER — DOCUSATE SODIUM 50 MG/5ML PO LIQD
100.0000 mg | Freq: Two times a day (BID) | ORAL | Status: DC
  Filled 2023-05-12: qty 10

## 2023-05-12 MED ORDER — SODIUM CHLORIDE 0.9 % IV SOLN
3.0000 g | Freq: Once | INTRAVENOUS | Status: AC
  Administered 2023-05-12: 3 g via INTRAVENOUS
  Filled 2023-05-12: qty 8

## 2023-05-12 MED ORDER — ALTEPLASE 2 MG IJ SOLR: 2.0000 mg | Freq: Once | INTRAMUSCULAR | Status: DC | PRN

## 2023-05-12 MED ORDER — ACETAMINOPHEN 160 MG/5ML PO SOLN: 650.0000 mg | ORAL | Status: DC | PRN

## 2023-05-12 MED ORDER — ORAL CARE MOUTH RINSE: 15.0000 mL | OROMUCOSAL | Status: DC | PRN

## 2023-05-12 MED ORDER — PROPOFOL 1000 MG/100ML IV EMUL
0.0000 ug/kg/min | INTRAVENOUS | Status: DC
  Administered 2023-05-12: 5 ug/kg/min via INTRAVENOUS
  Administered 2023-05-12 – 2023-05-13 (×2): 25 ug/kg/min via INTRAVENOUS
  Filled 2023-05-12 (×2): qty 100

## 2023-05-12 MED ORDER — ALBUMIN HUMAN 25 % IV SOLN
25.0000 g | Freq: Once | INTRAVENOUS | Status: AC
  Administered 2023-05-12: 25 g via INTRAVENOUS

## 2023-05-12 MED ORDER — ALBUMIN HUMAN 25 % IV SOLN
25.0000 g | Freq: Once | INTRAVENOUS | Status: AC
  Administered 2023-05-12: 25 g via INTRAVENOUS
  Filled 2023-05-12: qty 100

## 2023-05-12 MED ORDER — FENTANYL 2500MCG IN NS 250ML (10MCG/ML) PREMIX INFUSION
0.0000 ug/h | INTRAVENOUS | Status: DC
Start: 2023-05-12 — End: 2023-05-13
  Administered 2023-05-12: 25 ug/h via INTRAVENOUS
  Filled 2023-05-12: qty 250

## 2023-05-12 MED ORDER — FAMOTIDINE 20 MG PO TABS
20.0000 mg | ORAL_TABLET | Freq: Two times a day (BID) | ORAL | Status: DC
  Administered 2023-05-12 – 2023-05-13 (×3): 20 mg
  Filled 2023-05-12 (×3): qty 1

## 2023-05-12 MED ORDER — NEPRO/CARBSTEADY PO LIQD: 237.0000 mL | ORAL | Status: DC | PRN

## 2023-05-12 MED ORDER — FENTANYL CITRATE PF 50 MCG/ML IJ SOSY
50.0000 ug | PREFILLED_SYRINGE | Freq: Once | INTRAMUSCULAR | Status: AC
  Administered 2023-05-12: 50 ug via INTRAVENOUS
  Filled 2023-05-12: qty 1

## 2023-05-12 MED ORDER — SODIUM CHLORIDE 0.9 % IV SOLN
2.0000 g | Freq: Once | INTRAVENOUS | Status: AC
  Administered 2023-05-12: 2 g via INTRAVENOUS
  Filled 2023-05-12: qty 20

## 2023-05-12 MED ORDER — ONDANSETRON HCL 4 MG/2ML IJ SOLN
4.0000 mg | Freq: Once | INTRAMUSCULAR | Status: AC
  Administered 2023-05-12: 4 mg via INTRAVENOUS
  Filled 2023-05-12: qty 2

## 2023-05-12 MED ORDER — POLYETHYLENE GLYCOL 3350 17 G PO PACK: 17.0000 g | PACK | Freq: Every day | ORAL | Status: DC | PRN

## 2023-05-12 MED ORDER — IOHEXOL 350 MG/ML SOLN
75.0000 mL | Freq: Once | INTRAVENOUS | Status: AC | PRN
  Administered 2023-05-12: 75 mL via INTRAVENOUS

## 2023-05-12 MED ORDER — SODIUM CHLORIDE 0.9 % IV SOLN
250.0000 mL | INTRAVENOUS | Status: AC
  Administered 2023-05-12: 250 mL via INTRAVENOUS

## 2023-05-12 MED ORDER — POLYETHYLENE GLYCOL 3350 17 G PO PACK
17.0000 g | PACK | Freq: Every day | ORAL | Status: DC
Start: 2023-05-12 — End: 2023-05-13

## 2023-05-12 MED ORDER — HEPARIN SODIUM (PORCINE) 1000 UNIT/ML DIALYSIS
2000.0000 [IU] | Freq: Once | INTRAMUSCULAR | Status: AC
  Administered 2023-05-12: 2000 [IU] via INTRAVENOUS_CENTRAL

## 2023-05-12 MED ORDER — PROPOFOL 1000 MG/100ML IV EMUL: 5.0000 ug/kg/min | INTRAVENOUS | Status: DC

## 2023-05-12 MED ORDER — FENTANYL CITRATE PF 50 MCG/ML IJ SOSY
25.0000 ug | PREFILLED_SYRINGE | INTRAMUSCULAR | Status: DC | PRN
  Administered 2023-05-12 (×2): 25 ug via INTRAVENOUS

## 2023-05-12 MED ORDER — ANTICOAGULANT SODIUM CITRATE 4% (200MG/5ML) IV SOLN: 5.0000 mL | Status: DC | PRN

## 2023-05-12 MED ORDER — SODIUM CHLORIDE 0.9 % IV SOLN
500.0000 mg | Freq: Once | INTRAVENOUS | Status: AC
  Administered 2023-05-12: 500 mg via INTRAVENOUS
  Filled 2023-05-12: qty 5

## 2023-05-12 MED ORDER — ALBUMIN HUMAN 25 % IV SOLN: 12.5000 g | Freq: Once | INTRAVENOUS | Status: DC

## 2023-05-12 MED ORDER — ROCURONIUM BROMIDE 10 MG/ML (PF) SYRINGE
PREFILLED_SYRINGE | INTRAVENOUS | Status: AC | PRN
  Administered 2023-05-12: 100 mg via INTRAVENOUS

## 2023-05-12 MED ORDER — ETOMIDATE 2 MG/ML IV SOLN
INTRAVENOUS | Status: AC | PRN
  Administered 2023-05-12: 20 mg via INTRAVENOUS

## 2023-05-12 MED ORDER — HEPARIN SODIUM (PORCINE) 1000 UNIT/ML DIALYSIS
2000.0000 [IU] | INTRAMUSCULAR | Status: AC | PRN
  Administered 2023-05-12: 2000 [IU] via INTRAVENOUS_CENTRAL

## 2023-05-12 MED ORDER — ORAL CARE MOUTH RINSE
15.0000 mL | OROMUCOSAL | Status: DC
  Administered 2023-05-12 – 2023-05-13 (×16): 15 mL via OROMUCOSAL

## 2023-05-12 MED ORDER — SODIUM CHLORIDE 0.9 % IV SOLN
3.0000 g | INTRAVENOUS | Status: DC
  Administered 2023-05-13 – 2023-05-14 (×2): 3 g via INTRAVENOUS
  Filled 2023-05-12 (×2): qty 8

## 2023-05-12 MED ORDER — LIDOCAINE HCL (PF) 1 % IJ SOLN: 5.0000 mL | INTRAMUSCULAR | Status: DC | PRN

## 2023-05-12 MED ORDER — CALCITRIOL 0.25 MCG PO CAPS: 1.2500 ug | ORAL_CAPSULE | ORAL | Status: DC

## 2023-05-12 MED ORDER — PROPOFOL 1000 MG/100ML IV EMUL
INTRAVENOUS | Status: AC
  Administered 2023-05-12: 15 ug/kg/min via INTRAVENOUS
  Filled 2023-05-12: qty 100

## 2023-05-12 MED ORDER — LIDOCAINE-PRILOCAINE 2.5-2.5 % EX CREA: 1.0000 | TOPICAL_CREAM | CUTANEOUS | Status: DC | PRN

## 2023-05-12 MED ORDER — FENTANYL CITRATE PF 50 MCG/ML IJ SOSY: 25.0000 ug | PREFILLED_SYRINGE | INTRAMUSCULAR | Status: DC | PRN

## 2023-05-12 MED ORDER — NOREPINEPHRINE 4 MG/250ML-% IV SOLN
2.0000 ug/min | INTRAVENOUS | Status: DC
  Administered 2023-05-12: 2 ug/min via INTRAVENOUS
  Administered 2023-05-13: 8 ug/min via INTRAVENOUS
  Filled 2023-05-12 (×2): qty 250

## 2023-05-12 MED ORDER — DOCUSATE SODIUM 50 MG/5ML PO LIQD: 100.0000 mg | Freq: Two times a day (BID) | ORAL | Status: DC | PRN

## 2023-05-12 MED ORDER — HEPARIN SODIUM (PORCINE) 1000 UNIT/ML DIALYSIS: 1000.0000 [IU] | INTRAMUSCULAR | Status: DC | PRN

## 2023-05-12 MED ORDER — FENTANYL CITRATE PF 50 MCG/ML IJ SOSY
100.0000 ug | PREFILLED_SYRINGE | Freq: Once | INTRAMUSCULAR | Status: AC
  Administered 2023-05-12: 100 ug via INTRAVENOUS
  Filled 2023-05-12: qty 2

## 2023-05-12 MED ORDER — SODIUM CHLORIDE 0.9% IV SOLUTION: Freq: Once | INTRAVENOUS | Status: AC

## 2023-05-12 MED ORDER — HEPARIN SODIUM (PORCINE) 5000 UNIT/ML IJ SOLN
5000.0000 [IU] | Freq: Three times a day (TID) | INTRAMUSCULAR | Status: DC
  Administered 2023-05-12 – 2023-05-21 (×29): 5000 [IU] via SUBCUTANEOUS
  Filled 2023-05-12 (×29): qty 1

## 2023-05-12 MED ORDER — PENTAFLUOROPROP-TETRAFLUOROETH EX AERO: 1.0000 | INHALATION_SPRAY | CUTANEOUS | Status: DC | PRN

## 2023-05-12 NOTE — ED Provider Notes (Signed)
 [place] EMERGENCY DEPARTMENT AT [facility] Provider Note   CSN: [id number] Arrival date & time: 05/12/23  9686     History  Chief Complaint  Patient presents with   Emesis    [name] is a 83 y.o. female.  The history is provided by the patient and medical records.  Emesis  83 y.o. F with hx of ESRD on HD (MWF), HTN, anemia, presenting to the ED with N/V/D x24 hours.  States she just overall feels unwell.  Denies fever/chills.  No blood in emesis or stool.  Had witnessed syncopal event at home with EMS, no head injury.  States she just feels lightheaded.  Last dialysis on Monday, full session at that time.  She is unsure of any sick contacts, has been around some people at dialysis center that are coughing.  Denies chest pain.  Sats low with EMS, 88% on RA on arrival to ED.  Not generally O2 dependent.  Home Medications Prior to Admission medications   Medication Sig Start Date End Date Taking? Authorizing Provider  B Complex-C-Folic Acid  (DIALYVITE TABLET) TABS Take 1 tablet by mouth daily. Patient not taking: Reported on 03/14/2023 03/13/18   [provider]  calcitRIOL  (ROCALTROL ) 0.5 MCG capsule Take 1 capsule by mouth 2 (two) times daily. Patient not taking: Reported on 03/14/2023 11/26/15   [provider]  calcium  acetate (PHOSLO ) 667 MG capsule Take 1,334-2,001 mg by mouth See admin instructions. 2001mg  three times daily with meals and 1334mg  with two snacks daily Patient not taking: Reported on 03/14/2023 01/12/16   [provider]  cyanocobalamin  1000 MCG tablet Take 1 tablet (1,000 mcg total) by mouth daily. 03/19/23   Elgergawy, Brayton RAMAN, MD  lidocaine -prilocaine  (EMLA ) cream Apply 1 application topically daily as needed for pain. 05/23/18   [provider]  midodrine  (PROAMATINE ) 5 MG tablet Take 1 tablet (5 mg total) by mouth 2 (two) times daily with a meal. 03/19/23   Elgergawy, Brayton RAMAN, MD      Allergies     Patient has no known allergies.    Review of Systems   Review of Systems  Gastrointestinal:  Positive for nausea and vomiting.  All other systems reviewed and are negative.   Physical Exam Updated Vital Signs BP (!) 95/56 (BP Location: Right Arm)   Pulse 79   Temp 97.6 F (36.4 C) (Oral)   Resp (!) 21   Ht 5' (1.524 m)   Wt 68 kg   LMP  (LMP Unknown)   SpO2 91%   BMI 29.29 kg/m   Physical Exam Vitals and nursing note reviewed.  Constitutional:      Appearance: She is well-developed.  HENT:     Head: Normocephalic and atraumatic.  Eyes:     Conjunctiva/sclera: Conjunctivae normal.     Pupils: Pupils are equal, round, and reactive to light.  Cardiovascular:     Rate and Rhythm: Normal rate and regular rhythm.     Heart sounds: Normal heart sounds.  Pulmonary:     Effort: Pulmonary effort is normal. No respiratory distress.     Breath sounds: Normal breath sounds. No rhonchi.     Comments: Sats 88% on RA, improved to mid 90's on 3L supplemental O2 Abdominal:     General: Bowel sounds are normal.     Palpations: Abdomen is soft.  Musculoskeletal:        General: Normal range of motion.     Cervical back: Normal range of motion.  Comments: Fistula LUE, thrill present  Skin:    General: Skin is warm and dry.  Neurological:     Mental Status: She is alert and oriented to person, place, and time.     ED Results / Procedures / Treatments   Labs (all labs ordered are listed, but only abnormal results are displayed) Labs Reviewed  COMPREHENSIVE METABOLIC PANEL - Abnormal; Notable for the following components:      Result Value   Chloride 96 (*)    Glucose, Bld 110 (*)    BUN 63 (*)    Creatinine, Ser 10.99 (*)    GFR, Estimated 3 (*)    All other components within normal limits  RESP PANEL BY RT-PCR (RSV, FLU A&B, COVID)  RVPGX2  CBC WITH DIFFERENTIAL/PLATELET  I-STAT CHEM 8, ED  TROPONIN I (HIGH SENSITIVITY)  TROPONIN I (HIGH SENSITIVITY)    EKG EKG  Interpretation Date/Time:  Thursday May 12 2023 03:26:43 EST Ventricular Rate:  74 PR Interval:  123 QRS Duration:  86 QT Interval:  428 QTC Calculation: 475 R Axis:   -3  Text Interpretation: Sinus rhythm Probable left atrial enlargement Repol abnrm, severe global ischemia (LM/MVD) Confirmed by Theadore Sharper [phone]) on 05/12/2023 3:31:19 AM  Radiology DG Chest Port 1 View Result Date: 05/12/2023 CLINICAL DATA:  Shortness of breath.  Syncope. EXAM: PORTABLE CHEST 1 VIEW COMPARISON:  03/14/2023 FINDINGS: Cardiomegaly, vascular congestion. Interstitial prominence throughout the lungs. Small right pleural effusion. No acute bony abnormality. IMPRESSION: Cardiomegaly with vascular congestion and probable interstitial edema. Small right effusion. Electronically Signed   By: Franky Crease M.D.   On: 05/12/2023 03:53    Procedures Procedures    CRITICAL CARE Performed by: Olam CHRISTELLA Slocumb   Total critical care time: 45 minutes  Critical care time was exclusive of separately billable procedures and treating other patients.  Critical care was necessary to treat or prevent imminent or life-threatening deterioration.  Critical care was time spent personally by me on the following activities: development of treatment plan with patient and/or surrogate as well as nursing, discussions with consultants, evaluation of patient's response to treatment, examination of patient, obtaining history from patient or surrogate, ordering and performing treatments and interventions, ordering and review of laboratory studies, ordering and review of radiographic studies, pulse oximetry and re-evaluation of patient's condition.   Medications Ordered in ED Medications - No data to display  ED Course/ Medical Decision Making/ A&P                                 Medical Decision Making Amount and/or Complexity of Data Reviewed Labs: ordered. Radiology: ordered and independent interpretation  performed. ECG/medicine tests: ordered and independent interpretation performed.  Risk Prescription drug management. Decision regarding hospitalization.   83 y.o. F here with multiple concerns--- N/V/D x24 hours, syncopal event at home with EMS.  No head injury/trauma.  She is AAOx3, answering questions and following commands.  She is hypoxic on RA to 88%, placed on 3L with improvement to mid 90's.  Will check labs, CXR, RVP.  She did miss HD session yesterday at usual scheduled time which may be contributing.  0600-- delay in work-up due to IV access.  Labs are still pending.  CXR with volume overload.  Complained of chest pain and repeat EKG obtained.  Worsening RR even on supplemental O2, switched to bipap.  Will discuss with nephrology.  Care signed out to oncoming  provider.  Will need admission.  Final Clinical Impression(s) / ED Diagnoses Final diagnoses:  Shortness of breath  Hypoxia    Rx / DC Orders ED Discharge Orders     None         Jarold Olam HERO, PA-C 05/12/23 2218    Theadore Ozell HERO, MD 05/16/23 [phone]

## 2023-05-12 NOTE — Progress Notes (Signed)
 Pharmacy Antibiotic Note  [name] is a 83 y.o. female admitted on 05/12/2023 with  aspiration PNA .  Pharmacy has been consulted for unasyn  dosing.  Received azithromycin  and ceftriaxone  this morning - had vomited into Bipap requiring intubation. WBC 9, afebrile. ESRD - underwent HD today.   Plan: Unasyn  3g IV every 24 hours Monitor HD schedule, cx results, clinical pic  Height: 5' (152.4 cm) Weight: 74.4 kg (164 lb 0.4 oz) IBW/kg (Calculated) : 45.5  Temp (24hrs), Avg:97.9 F (36.6 C), Min:96.9 F (36.1 C), Max:98.7 F (37.1 C)  Recent Labs  Lab 05/12/23 0532 05/12/23 1105  WBC 9.1  --   CREATININE 10.99* 11.29*    Estimated Creatinine Clearance: 3.5 mL/min (A) (by C-G formula based on SCr of 11.29 mg/dL (H)).    No Known Allergies  Antimicrobials this admission: Barth 1/9 >>  Azithro/ceftriaxone  1/9 x1  Dose adjustments this admission: N/A  Microbiology results: 1/9 MRSA PCR: neg  Thank you for allowing pharmacy to participate in this patient's care,  Suzen Sour, PharmD, BCCCP Clinical Pharmacist  Phone: [phone] 05/12/2023 2:35 PM  Please check AMION for all [facility] Pharmacy phone numbers After 10:00 PM, call Main Pharmacy [phone]

## 2023-05-12 NOTE — Progress Notes (Signed)
   05/12/23 1512  Vitals  Temp (!) 97 F (36.1 C)  Pulse Rate (!) 57  Resp (!) 21  BP (!) 102/51  SpO2 100 %  O2 Device Ventilator  Oxygen Therapy  Patient Activity (if Appropriate) In bed  Pulse Oximetry Type Continuous  Oximetry Probe Site Changed No  Post Treatment  Dialyzer Clearance Clear  Hemodialysis Intake (mL) 100 mL  Liters Processed 78  Fluid Removed (mL) 2900 mL  Tolerated HD Treatment Yes  AVG/AVF Arterial Site Held (minutes) 10 minutes  AVG/AVF Venous Site Held (minutes) 10 minutes   Received patient in bed Informed consent signed and in chart.   TX duration:3 hours 15 minutes  Patient tolerated well.  Without acute distress.  Hand-off given to patient's nurse.   Access used: Left upper arm fistula Access issues: None  Total UF removed: 2.9L Medication(s) given: Albumin

## 2023-05-12 NOTE — Procedures (Signed)
 I was present at the procedure, reviewed the HD regimen and made appropriate changes.   Vinson Moselle MD  [facility] 05/12/2023, 12:54 PM

## 2023-05-12 NOTE — ED Triage Notes (Signed)
 BIB EMS - N/V/D last 24 hours. Had witnessed syncopal episode at home by EMS.  Dialysis patient MWF - last went on Monday.   Restricted L side

## 2023-05-12 NOTE — Progress Notes (Signed)
 Transported pt from ED28 to 2H03 with bedside RN. Vitals are stable.

## 2023-05-12 NOTE — Consult Note (Addendum)
 Renal Service Consult Note [facility]  [name] 05/12/2023 Denise JONETTA Fret, MD Requesting Physician: Dr. Harold  Reason for Consult: ESRD pt w/ resp failure  HPI: The patient is a 83 y.o. year-old w/ PMH as below who presented to ED after having a witnessed syncopal episode at home. Pt has hx of ESRD on HD, anemia, HTN. Pt missed HD yesterday and presented w/ resp distress and volume overload. Bipap was tried but pt vomited then required intubation w/ vent support. CXR showed bilat air space disease. IV abx started. We are asked to see for dialysis.   Seen in ICU, pt is on the vent and sedated. No hx obtained.  ROS - n/a   Past Medical History  Past Medical History:  Diagnosis Date   Anemia    Arthritis    Cancer (HCC)    urterine- surgical removal only tratment   Chronic kidney disease    end stage 4   Environmental and seasonal allergies    affects sinuses   Heart murmur    no one has mentioned it lately- had it as a child   Hypertension    Limb cramps    leg, thigh, hands   Pneumonia    young   Shortness of breath dyspnea    With exerion   Past Surgical History  Past Surgical History:  Procedure Laterality Date   ABDOMINAL HYSTERECTOMY     complete   AV FISTULA PLACEMENT Left 01/23/2016   Procedure: LEFT RADIOCEPHALIC  ARTERIOVENOUS (AV) FISTULA CREATION;  Surgeon: Gaile LELON New, MD;  Location: [facility] OR;  Service: Vascular;  Laterality: Left;   AV FISTULA PLACEMENT Left 03/09/2016   Procedure: ARTERIOVENOUS (AV) FISTULA CREATION-LEFT UPPER ARM;  Surgeon: Redell LITTIE Door, MD;  Location: [facility] OR;  Service: Vascular;  Laterality: Left;   BREAST SURGERY Bilateral    4 surgeries, Cyst in each breast   COLONOSCOPY W/ POLYPECTOMY     DILATION AND CURETTAGE OF UTERUS     INSERTION OF DIALYSIS CATHETER N/A 03/09/2016   Procedure: INSERTION OF DIALYSIS CATHETER;  Surgeon: Redell LITTIE Door, MD;  Location: [facility] OR;  Service: Vascular;  Laterality: N/A;   Family  History  Family History  Problem Relation Age of Onset   Cancer - Lung Mother    Renal Disease Father    Heart attack Father    Social History  reports that she has never smoked. She has never used smokeless tobacco. She reports that she does not drink alcohol  and does not use drugs. Allergies No Known Allergies Home medications Prior to Admission medications   Medication Sig Start Date End Date Taking? Authorizing Provider  B Complex-C-Folic Acid  (DIALYVITE TABLET) TABS Take 1 tablet by mouth daily. Patient not taking: Reported on 03/14/2023 03/13/18   [provider]  calcitRIOL  (ROCALTROL ) 0.5 MCG capsule Take 1 capsule by mouth 2 (two) times daily. Patient not taking: Reported on 03/14/2023 11/26/15   [provider]  calcium  acetate (PHOSLO ) 667 MG capsule Take 1,334-2,001 mg by mouth See admin instructions. 2001mg  three times daily with meals and 1334mg  with two snacks daily Patient not taking: Reported on 03/14/2023 01/12/16   [provider]  cyanocobalamin  1000 MCG tablet Take 1 tablet (1,000 mcg total) by mouth daily. 03/19/23   Pollard, Denise RAMAN, MD  lidocaine -prilocaine  (EMLA ) cream Apply 1 application topically daily as needed for pain. 05/23/18   [provider]  midodrine  (PROAMATINE ) 5 MG tablet Take 1 tablet (5 mg total)  by mouth 2 (two) times daily with a meal. 03/19/23   Pollard, Denise RAMAN, MD     Vitals:   05/12/23 0706 05/12/23 0800 05/12/23 0830 05/12/23 0840  BP:  (!) 195/82 (!) 199/96   Pulse: 98 85 (!) 103 (!) 110  Resp: 15 18 18 18   Temp:   (!) 97.1 F (36.2 C) (!) 97.5 F (36.4 C)  TempSrc:   Bladder   SpO2: 100% 100% 98% 97%  Weight:      Height:       Exam Gen alert, no distress No rash, cyanosis or gangrene Sclera anicteric, throat clear  No jvd or bruits Chest clear bilat to bases, no rales/ wheezing RRR no MRG Abd soft ntnd no mass or ascites +bs GU defer MS no joint effusions or deformity Ext 1+ bilat  pretib edema, no other edema Neuro is alert, Ox 3 , nf    AVF LUA +bruit    OP HD: MWF [facility] 3.5h   350/1.5   68.9kgkg  2/2 bath  L AVF  Heparin  2200  - last OP HD 1/06, post wt 72.7kg - missed 1/08 HD yesterday - rocaltrol  1.25 mcg three times per week - no esa, last Hb 11.2    Assessment/ Plan: AHRF / vol overload- CXR w/ sig bilat pulm edema. Missed HD Wed, came off 4kg over on Monday. No ^wbc or fevers. Plan iHD in ICU late this morning.   ESRD - on HD MWF. Missed HD 1/08. 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 Hb here 6.6 initially, but repeat Hb 9am w/o intervention was 11.6, so likely that initial result was a mistake.  Secondary hyperparathyroidism - cont phoslo  3 ac, rocaltrol  1.25 w/ hd. CCa in range. Add on phos.       Denise Fret  MD [facility] 05/12/2023, 8:41 AM  Recent Labs  Lab 05/12/23 0532  HGB 6.6*  ALBUMIN  3.6  CALCIUM  9.4  CREATININE 10.99*  K 4.9   Inpatient medications:  docusate  100 mg Per Tube BID   famotidine   20 mg Per Tube BID   heparin   5,000 Units Subcutaneous Q8H   polyethylene glycol  17 g Per Tube Daily    azithromycin  (ZITHROMAX ) 500 mg in sodium chloride  0.9 % 250 mL IVPB     cefTRIAXone  (ROCEPHIN )  IV     fentaNYL  infusion INTRAVENOUS 50 mcg/hr (05/12/23 9177)   propofol  (DIPRIVAN ) infusion 10 mcg/kg/min (05/12/23 0839)   acetaminophen , docusate, etomidate , fentaNYL  (SUBLIMAZE ) injection, fentaNYL  (SUBLIMAZE ) injection, polyethylene glycol, rocuronium 

## 2023-05-12 NOTE — Progress Notes (Signed)
 Consult for PIV start for CT exam. [place] with 1 attempt at [facility] unsuccessful. No further veins suitable for USGPIV to R arm. L arm with active AVG. ED RN notified.

## 2023-05-12 NOTE — H&P (Signed)
 NAME:  [name], MRN:  [id number], DOB:  05-02-1941, LOS: 0 ADMISSION DATE:  05/12/2023, CONSULTATION DATE: 05/12/2023 REFERRING MD: Emergency department physician, CHIEF COMPLAINT: Acute respiratory failure  History of Present Illness:  83 year old female who missed dialysis on Wednesday and presents with volume overload and respiratory distress.  Initially she was tried on BiPAP but vomited therefore she required intubation and full mechanical ventilatory support.  Nephrology has been contacted she will need hemodialysis as soon as possible.  She is sedated properly.  She is a pleasant with health issues that are well-documented below.  Noted her CT scan shows bilateral airspace disease she is currently on Zithromax  and Rocephin .  Pertinent  Medical History   Past Medical History:  Diagnosis Date   Anemia    Arthritis    Cancer (HCC)    urterine- surgical removal only tratment   Chronic kidney disease    end stage 4   Environmental and seasonal allergies    affects sinuses   Heart murmur    no one has mentioned it lately- had it as a child   Hypertension    Limb cramps    leg, thigh, hands   Pneumonia    young   Shortness of breath dyspnea    With exerion     Significant Hospital Events: Including procedures, antibiotic start and stop dates in addition to other pertinent events     Interim History / Subjective:  Intubated for respiratory distress  Objective   Blood pressure (!) 199/96, pulse (!) 103, temperature (!) 96.1 F (35.6 C), resp. rate 18, height 5' (1.524 m), weight 68 kg, SpO2 98%.    Vent Mode: PSV;BIPAP FiO2 (%):  [60 %-100 %] 100 % PEEP:  [6 cmH20] 6 cmH20 Pressure Support:  [12 cmH20] 12 cmH20  No intake or output data in the 24 hours ending 05/12/23 0835 Filed Weights   05/12/23 0326  Weight: 68 kg    Examination: General: Elderly female who sedated on propofol  HENT: Positive JVD Lungs: Coarse rhonchi bilaterally Cardiovascular: Heart  sounds are distant positive murmur Abdomen: Soft obese Extremities: 3+ edema Neuro: Does not follow commands GU: Foley catheter to be placed  Resolved Hospital Problem list     Assessment & Plan:  Acute respiratory failure requiring intubation in the setting of volume overload she has end-stage renal disease and missed dialysis on Wednesday.  She is initially placed on BiPAP but she vomited therefore was transitioned to endotracheal tube per emergency department physician.  Pulmonary critical care asked to admit. Admit to intensive care unit Full mechanical ventilatory support Dialysis as soon as possible Empirical Unasyn   Positive troponins up to 60 Repeat troponins 2D echo was No cardiac consult at this time.  Hypertension I suspect this is secondary to being intubated without proper sedation Fentanyl  added to propofol  Continue to monitor blood pressure   Best Practice (right click and Reselect all SmartList Selections daily)   Diet/type: NPO DVT prophylaxis prophylactic heparin   Pressure ulcer(s): N/A GI prophylaxis: H2B Lines: N/A Foley:  N/A Code Status:  full code Last date of multidisciplinary goals of care discussion [tbd] No family at the bedside Labs   CBC: Recent Labs  Lab 05/12/23 0532  WBC 9.1  NEUTROABS 6.3  HGB 6.6*  HCT 20.0*  MCV 104.2*  PLT 144*    Basic Metabolic Panel: Recent Labs  Lab 05/12/23 0532  NA 137  K 4.9  CL 96*  CO2 26  GLUCOSE 110*  BUN 63*  CREATININE 10.99*  CALCIUM  9.4   GFR: Estimated Creatinine Clearance: 3.4 mL/min (A) (by C-G formula based on SCr of 10.99 mg/dL (H)). Recent Labs  Lab 05/12/23 0532  WBC 9.1    Liver Function Tests: Recent Labs  Lab 05/12/23 0532  AST 21  ALT 28  ALKPHOS 62  BILITOT 1.0  PROT 7.1  ALBUMIN  3.6   No results for input(s): LIPASE, AMYLASE in the last 168 hours. No results for input(s): AMMONIA in the last 168 hours.  ABG    Component Value Date/Time    PHART 7.448 03/15/2023 0638   PCO2ART 35.7 03/15/2023 0638   PO2ART 140 (H) 03/15/2023 0638   HCO3 24.7 03/15/2023 0638   TCO2 26 03/15/2023 0638   ACIDBASEDEF 1.0 03/14/2023 1853   O2SAT 99 03/15/2023 0638     Coagulation Profile: No results for input(s): INR, PROTIME in the last 168 hours.  Cardiac Enzymes: No results for input(s): CKTOTAL, CKMB, CKMBINDEX, TROPONINI in the last 168 hours.  HbA1C: No results found for: HGBA1C  CBG: No results for input(s): GLUCAP in the last 168 hours.  Review of Systems:   na  Past Medical History:  She,  has a past medical history of Anemia, Arthritis, Cancer (HCC), Chronic kidney disease, Environmental and seasonal allergies, Heart murmur, Hypertension, Limb cramps, Pneumonia, and Shortness of breath dyspnea.   Surgical History:   Past Surgical History:  Procedure Laterality Date   ABDOMINAL HYSTERECTOMY     complete   AV FISTULA PLACEMENT Left 01/23/2016   Procedure: LEFT RADIOCEPHALIC  ARTERIOVENOUS (AV) FISTULA CREATION;  Surgeon: Gaile LELON New, MD;  Location: [facility] OR;  Service: Vascular;  Laterality: Left;   AV FISTULA PLACEMENT Left 03/09/2016   Procedure: ARTERIOVENOUS (AV) FISTULA CREATION-LEFT UPPER ARM;  Surgeon: Redell LITTIE Door, MD;  Location: [facility] OR;  Service: Vascular;  Laterality: Left;   BREAST SURGERY Bilateral    4 surgeries, Cyst in each breast   COLONOSCOPY W/ POLYPECTOMY     DILATION AND CURETTAGE OF UTERUS     INSERTION OF DIALYSIS CATHETER N/A 03/09/2016   Procedure: INSERTION OF DIALYSIS CATHETER;  Surgeon: Redell LITTIE Door, MD;  Location: [facility] OR;  Service: Vascular;  Laterality: N/A;     Social History:   reports that she has never smoked. She has never used smokeless tobacco. She reports that she does not drink alcohol  and does not use drugs.   Family History:  Her family history includes Cancer - Lung in her mother; Heart attack in her father; Renal Disease in her father.   Allergies No Known  Allergies   Home Medications  Prior to Admission medications   Medication Sig Start Date End Date Taking? Authorizing Provider  B Complex-C-Folic Acid  (DIALYVITE TABLET) TABS Take 1 tablet by mouth daily. Patient not taking: Reported on 03/14/2023 03/13/18   [provider]  calcitRIOL  (ROCALTROL ) 0.5 MCG capsule Take 1 capsule by mouth 2 (two) times daily. Patient not taking: Reported on 03/14/2023 11/26/15   [provider]  calcium  acetate (PHOSLO ) 667 MG capsule Take 1,334-2,001 mg by mouth See admin instructions. 2001mg  three times daily with meals and 1334mg  with two snacks daily Patient not taking: Reported on 03/14/2023 01/12/16   [provider]  cyanocobalamin  1000 MCG tablet Take 1 tablet (1,000 mcg total) by mouth daily. 03/19/23   Elgergawy, Brayton RAMAN, MD  lidocaine -prilocaine  (EMLA ) cream Apply 1 application topically daily as needed for pain. 05/23/18   [provider]  midodrine  (PROAMATINE ) 5  MG tablet Take 1 tablet (5 mg total) by mouth 2 (two) times daily with a meal. 03/19/23   Elgergawy, Brayton RAMAN, MD     Critical care time: 35 min    Marcey Devynn Hessler ACNP Acute Care Nurse Practitioner [facility] Please consult Amion 05/12/2023, 8:36 AM

## 2023-05-12 NOTE — ED Provider Notes (Signed)
 Care assumed from PA Olam Slocumb at shift change, please see her note for full details, but in brief [name] is a 83 y.o. female with a history of ESRD on hemodialysis who presents with shortness of breath, nausea vomiting and syncopal episode witnessed by EMS.  Patient missed Wednesday dialysis session, last dialyzed on Monday and has recently had some increased fluid overload since the holidays.  On arrival patient was noted to be hypoxic, initially on nasal cannula without significant improvement then placed on BiPAP.  Patient signed out with plan for consult to nephrology for dialysis and admission to the hospitalist.  Patient initially had relief with BiPAP but then became more obtunded and began vomiting, no longer tolerating BiPAP.  Patient requiring intubation.  Workup in the ED notable for hemoglobin of 6.6, no leukocytosis, stable mild thrombocytopenia, despite missed dialysis potassium is normal at 4.9, BUN of 63, creatinine 10.99, no other significant electrolyte derangements.  Initial troponin elevated at 264.  EKG with some diffuse ST depressions.  Respiratory viral swab pending.  Chest x-ray with some pulmonary vascular congestion and interstitial edema.  CT PE study pending.   Physical Exam  BP 128/66   Pulse 98   Temp 97.6 F (36.4 C) (Oral)   Resp 15   Ht 5' (1.524 m)   Wt 68 kg   LMP  (LMP Unknown)   SpO2 100%   BMI 29.29 kg/m   Physical Exam Constitutional:      Appearance: She is ill-appearing.     Comments: Somnolent and ill-appearing, easily arouses to verbal stimuli  Cardiovascular:     Rate and Rhythm: Normal rate and regular rhythm.  Pulmonary:     Comments: Increased respiratory effort and active vomiting, placed on nonrebreather Skin:    General: Skin is warm and dry.     Procedures  Procedures  ED Course / MDM   Labs Reviewed  CBC WITH DIFFERENTIAL/PLATELET - Abnormal; Notable for the following components:      Result Value   RBC 1.92  (*)    Hemoglobin 6.6 (*)    HCT 20.0 (*)    MCV 104.2 (*)    MCH 34.4 (*)    Platelets 144 (*)    Monocytes Absolute 1.5 (*)    All other components within normal limits  COMPREHENSIVE METABOLIC PANEL - Abnormal; Notable for the following components:   Chloride 96 (*)    Glucose, Bld 110 (*)    BUN 63 (*)    Creatinine, Ser 10.99 (*)    GFR, Estimated 3 (*)    All other components within normal limits  TROPONIN I (HIGH SENSITIVITY) - Abnormal; Notable for the following components:   Troponin I (High Sensitivity) 264 (*)    All other components within normal limits  RESP PANEL BY RT-PCR (RSV, FLU A&B, COVID)  RVPGX2  I-STAT CHEM 8, ED  TROPONIN I (HIGH SENSITIVITY)    Medical Decision Making Amount and/or Complexity of Data Reviewed Labs: ordered. Radiology: ordered.  Risk Prescription drug management. Decision regarding hospitalization.   Consult placed to nephrology, case discussed with Dr. Dalene, will see patient to arrange for dialysis.  Pt successfully intubated at bedside by Dr. Theadore, please see his separate documentation for procedure.  Consult placed to critical care, case discussed with NP Marcey Minor who will see and admit patient to ICU.  Pt to CT for PE study.       Alva Larraine FALCON, PA-C 05/12/23 9268    Theadore,  Ozell HERO, MD 05/12/23 [phone]

## 2023-05-12 NOTE — ED Provider Notes (Signed)
 Suspect fluid overloaded state but also considering PE given the syncopal episode, recent leg pain, recent hospitalization, and prominent EKG changes.  Clinical worsening at about 6am, patient with moderate to severe respiratory distress.  Complaining of severe chest pain as well.  EKG unchanged but still prominent ST depressions diffusely suggesting demand ischemia.  Began vomiting and not tolerating bipap, O2 desaturations down to 80%.  Decision made to intubate.  Preoxygenated with NRB.  Patient became less responsive.  RSI as described below.  To be admitted to ICU, nephrology made aware.  CTA PE study pending.    .Critical Care  Performed by: Theadore Ozell HERO, MD Authorized by: Theadore Ozell HERO, MD   Critical care provider statement:    Critical care time (minutes):  45   Critical care was necessary to treat or prevent imminent or life-threatening deterioration of the following conditions:  Respiratory failure   Critical care was time spent personally by me on the following activities:  Development of treatment plan with patient or surrogate, discussions with consultants, evaluation of patient's response to treatment, examination of patient, ordering and review of laboratory studies, ordering and review of radiographic studies, ordering and performing treatments and interventions, pulse oximetry, re-evaluation of patient's condition and review of old charts Procedure Name: Intubation Date/Time: 05/12/2023 7:24 AM  Performed by: Theadore Ozell HERO, MDPre-anesthesia Checklist: Patient identified, Patient being monitored, Emergency Drugs available, Timeout performed and Suction available Oxygen Delivery Method: Non-rebreather mask Preoxygenation: Pre-oxygenation with 100% oxygen Induction Type: Rapid sequence Ventilation: Mask ventilation without difficulty Laryngoscope Size: Glidescope and 3 Grade View: Grade I Tube size: 7.5 mm Number of attempts: 1 Airway Equipment and Method: Rigid  stylet Placement Confirmation: ETT inserted through vocal cords under direct vision, CO2 detector and Breath sounds checked- equal and bilateral Secured at: 22 cm Tube secured with: Tape    .Ultrasound ED Peripheral IV (Provider)  Date/Time: 05/12/2023 7:25 AM  Performed by: Theadore Ozell HERO, MD Authorized by: Theadore Ozell HERO, MD   Procedure details:    Indications: multiple failed IV attempts     Skin Prep: chlorhexidine  gluconate     Location: right upper arm.   Angiocath:  18 G   Bedside Ultrasound Guided: Yes     Patient tolerated procedure without complications: Yes     Dressing applied: Yes       Theadore Ozell HERO, MD 05/12/23 [phone]

## 2023-05-12 NOTE — Plan of Care (Signed)

## 2023-05-13 DIAGNOSIS — R0602 Shortness of breath: Secondary | ICD-10-CM

## 2023-05-13 LAB — POCT I-STAT EG7
Acid-Base Excess: 8 mmol/L — ABNORMAL HIGH (ref 0.0–2.0)
Bicarbonate: 33.8 mmol/L — ABNORMAL HIGH (ref 20.0–28.0)
Calcium, Ion: 1.07 mmol/L — ABNORMAL LOW (ref 1.15–1.40)
HCT: 27 % — ABNORMAL LOW (ref 36.0–46.0)
Hemoglobin: 9.2 g/dL — ABNORMAL LOW (ref 12.0–15.0)
O2 Saturation: 82 %
Patient temperature: 98.7
Potassium: 4.7 mmol/L (ref 3.5–5.1)
Sodium: 131 mmol/L — ABNORMAL LOW (ref 135–145)
TCO2: 35 mmol/L — ABNORMAL HIGH (ref 22–32)
pCO2, Ven: 52.3 mm[Hg] (ref 44–60)
pH, Ven: 7.418 (ref 7.25–7.43)
pO2, Ven: 47 mm[Hg] — ABNORMAL HIGH (ref 32–45)

## 2023-05-13 LAB — BASIC METABOLIC PANEL
Anion gap: 17 — ABNORMAL HIGH (ref 5–15)
BUN: 34 mg/dL — ABNORMAL HIGH (ref 8–23)
CO2: 26 mmol/L (ref 22–32)
Calcium: 8.8 mg/dL — ABNORMAL LOW (ref 8.9–10.3)
Chloride: 91 mmol/L — ABNORMAL LOW (ref 98–111)
Creatinine, Ser: 7.47 mg/dL — ABNORMAL HIGH (ref 0.44–1.00)
GFR, Estimated: 5 mL/min — ABNORMAL LOW (ref >60)
Glucose, Bld: 107 mg/dL — ABNORMAL HIGH (ref 70–99)
Potassium: 4.5 mmol/L (ref 3.5–5.1)
Sodium: 134 mmol/L — ABNORMAL LOW (ref 135–145)

## 2023-05-13 LAB — HEPATITIS B SURFACE ANTIBODY, QUANTITATIVE: Hep B S AB Quant (Post): 31 m[IU]/mL

## 2023-05-13 LAB — CBC
HCT: 29.5 % — ABNORMAL LOW (ref 36.0–46.0)
Hemoglobin: 9.8 g/dL — ABNORMAL LOW (ref 12.0–15.0)
MCH: 33.9 pg (ref 26.0–34.0)
MCHC: 33.2 g/dL (ref 30.0–36.0)
MCV: 102.1 fL — ABNORMAL HIGH (ref 80.0–100.0)
Platelets: 105 10*3/uL — ABNORMAL LOW (ref 150–400)
RBC: 2.89 MIL/uL — ABNORMAL LOW (ref 3.87–5.11)
RDW: 13.2 % (ref 11.5–15.5)
WBC: 9.6 10*3/uL (ref 4.0–10.5)
nRBC: 0 % (ref 0.0–0.2)

## 2023-05-13 LAB — TRIGLYCERIDES: Triglycerides: 102 mg/dL (ref <150)

## 2023-05-13 LAB — GLUCOSE, CAPILLARY
Glucose-Capillary: 104 mg/dL — ABNORMAL HIGH (ref 70–99)
Glucose-Capillary: 108 mg/dL — ABNORMAL HIGH (ref 70–99)
Glucose-Capillary: 111 mg/dL — ABNORMAL HIGH (ref 70–99)
Glucose-Capillary: 76 mg/dL (ref 70–99)
Glucose-Capillary: 85 mg/dL (ref 70–99)
Glucose-Capillary: 95 mg/dL (ref 70–99)

## 2023-05-13 LAB — PROCALCITONIN: Procalcitonin: 3.1 ng/mL

## 2023-05-13 MED ORDER — DOCUSATE SODIUM 100 MG PO CAPS: 100.0000 mg | ORAL_CAPSULE | Freq: Two times a day (BID) | ORAL | Status: DC | PRN

## 2023-05-13 MED ORDER — CALCIUM GLUCONATE-NACL 1-0.675 GM/50ML-% IV SOLN
1.0000 g | Freq: Once | INTRAVENOUS | Status: AC
  Administered 2023-05-13: 1000 mg via INTRAVENOUS
  Filled 2023-05-13: qty 50

## 2023-05-13 MED ORDER — FENTANYL BOLUS VIA INFUSION
30.0000 ug | INTRAVENOUS | Status: DC | PRN
  Administered 2023-05-13: 30 ug via INTRAVENOUS

## 2023-05-13 MED ORDER — MIDODRINE HCL 5 MG PO TABS
5.0000 mg | ORAL_TABLET | Freq: Three times a day (TID) | ORAL | Status: DC
Start: 2023-05-13 — End: 2023-05-13
  Administered 2023-05-13: 5 mg
  Filled 2023-05-13: qty 1

## 2023-05-13 MED ORDER — MIDODRINE HCL 5 MG PO TABS
10.0000 mg | ORAL_TABLET | Freq: Once | ORAL | Status: AC
  Administered 2023-05-13: 10 mg via ORAL
  Filled 2023-05-13: qty 2

## 2023-05-13 MED ORDER — NOREPINEPHRINE 4 MG/250ML-% IV SOLN
0.0000 ug/min | INTRAVENOUS | Status: DC
  Administered 2023-05-13: 2 ug/min via INTRAVENOUS
  Administered 2023-05-13: 4 ug/min via INTRAVENOUS
  Filled 2023-05-13 (×3): qty 250

## 2023-05-13 MED ORDER — POLYETHYLENE GLYCOL 3350 17 G PO PACK: 17.0000 g | PACK | Freq: Every day | ORAL | Status: DC | PRN

## 2023-05-13 MED ORDER — MIDODRINE HCL 5 MG PO TABS
5.0000 mg | ORAL_TABLET | Freq: Three times a day (TID) | ORAL | Status: DC
  Administered 2023-05-13: 5 mg via ORAL
  Filled 2023-05-13: qty 1

## 2023-05-13 MED ORDER — MIDODRINE HCL 5 MG PO TABS: 5.0000 mg | ORAL_TABLET | Freq: Three times a day (TID) | ORAL | Status: DC

## 2023-05-13 MED ORDER — POLYETHYLENE GLYCOL 3350 17 G PO PACK
17.0000 g | PACK | Freq: Every day | ORAL | Status: DC
  Filled 2023-05-13: qty 1

## 2023-05-13 MED ORDER — CALCITRIOL 0.25 MCG PO CAPS
1.2500 ug | ORAL_CAPSULE | ORAL | Status: DC
  Administered 2023-05-13 – 2023-05-16 (×2): 1.25 ug via ORAL
  Filled 2023-05-13: qty 5
  Filled 2023-05-13: qty 1
  Filled 2023-05-13: qty 5

## 2023-05-13 MED ORDER — ONDANSETRON HCL 4 MG/2ML IJ SOLN
4.0000 mg | Freq: Four times a day (QID) | INTRAMUSCULAR | Status: DC | PRN
  Administered 2023-05-13 – 2023-05-14 (×3): 4 mg via INTRAVENOUS
  Filled 2023-05-13 (×3): qty 2

## 2023-05-13 MED ORDER — ACETAMINOPHEN 325 MG PO TABS
650.0000 mg | ORAL_TABLET | ORAL | Status: DC | PRN
  Administered 2023-05-17: 325 mg via ORAL
  Administered 2023-05-18 – 2023-05-19 (×2): 650 mg via ORAL
  Filled 2023-05-13 (×4): qty 2

## 2023-05-13 MED ORDER — CHLORHEXIDINE GLUCONATE CLOTH 2 % EX PADS
6.0000 | MEDICATED_PAD | Freq: Every day | CUTANEOUS | Status: DC
  Administered 2023-05-14 – 2023-05-15 (×2): 6 via TOPICAL

## 2023-05-13 MED ORDER — METOCLOPRAMIDE HCL 5 MG/ML IJ SOLN
5.0000 mg | Freq: Three times a day (TID) | INTRAMUSCULAR | Status: DC
  Administered 2023-05-13 – 2023-05-16 (×10): 5 mg via INTRAVENOUS
  Filled 2023-05-13 (×10): qty 2

## 2023-05-13 NOTE — Progress Notes (Signed)
 [facility] Progress Note  Subjective: extubated and alert and interacting, no c/o's. HD yest w/ 2.9 L off.   Vitals:   05/13/23 1500 05/13/23 1515 05/13/23 1530 05/13/23 1545  BP: (!) 100/56 (!) 106/57 (!) 99/45   Pulse: 60 61 62 63  Resp: 17 13 15 16   Temp:      TempSrc:      SpO2: 96% 97% 97% 94%  Weight:      Height:        Exam: Gen alert, no distress Sclera anicteric, throat clear  No jvd or bruits Chest clear bilat to bases RRR no MRG Abd soft ntnd no mass or ascites +bs Ext 1+ bilat pretib edema Neuro is alert, Ox 3 , nf    AVF LUA +bruit     OP HD: MWF [facility] 3.5h   350/1.5   68.9kgkg  2/2 bath  L AVF  Heparin  2200  - last OP HD 1/06, post wt 72.7kg - missed 1/08 HD yesterday - rocaltrol  1.25 mcg three times per week - no esa, last Hb 11.2       Assessment/ Plan: AHRF / vol overload- CXR w/ sig bilat pulm edema. Missed HD Wed, came off 4kg over on Monday. No ^wbc or fevers. Plan iHD in ICU late this morning.   ESRD - on HD MWF. Missed HD 1/08. Had HD here Thursday. Plan next HD Sat off schedule, then resume MWF next week.  Hypotension  - pressors weaning down this am, almost off. Getting midodrine  5mg  tid here as at home. Improving.  Volume - mild LE edema resolved. Appears to be still 3kg up. UF 2.5- 3 L w/ next HD Anemia of esrd - not on esa at OP unit. Hb 10-12 here. Follow.  Secondary hyperparathyroidism - cont phoslo  3 ac, rocaltrol  1.25 w/ hd. CCa in range.Phos high at 9. Follow.      Myer Fret MD  [facility] 05/13/2023, 5:10 PM  Recent Labs  Lab 05/12/23 0532 05/12/23 0921 05/12/23 1105 05/12/23 1213 05/13/23 0258 05/13/23 0453  HGB 6.6*   < >  --    < > 9.8* 9.2*  ALBUMIN  3.6  --   --   --   --   --   CALCIUM  9.4  --   --   --  8.8*  --   PHOS  --   --  9.2*  --   --   --   CREATININE 10.99*  --  11.29*  --  7.47*  --   K 4.9   < >  --    < > 4.5 4.7   < > = values in this interval not displayed.   No results for input(s):  IRON, TIBC, FERRITIN in the last 168 hours. Inpatient medications:  sodium chloride    Intravenous Once   calcitRIOL   1.25 mcg Oral Q M,W,F-1800   Chlorhexidine  Gluconate Cloth  6 each Topical Q0600   heparin   5,000 Units Subcutaneous Q8H   metoCLOPramide  (REGLAN ) injection  5 mg Intravenous Q8H   midodrine   5 mg Oral TID WC   mouth rinse  15 mL Mouth Rinse Q2H   [START ON 05/14/2023] polyethylene glycol  17 g Oral Daily    ampicillin -sulbactam (UNASYN ) IV     anticoagulant sodium citrate      norepinephrine  (LEVOPHED ) Adult infusion 2 mcg/min (05/13/23 1356)   acetaminophen , alteplase , anticoagulant sodium citrate , docusate sodium , feeding supplement (NEPRO CARB STEADY), heparin , lidocaine  (PF), lidocaine -prilocaine , ondansetron  (ZOFRAN ) IV,  mouth rinse, pentafluoroprop-tetrafluoroeth, polyethylene glycol

## 2023-05-13 NOTE — Progress Notes (Addendum)
 NAME:  [name], MRN:  [id number], DOB:  [birth year], LOS: 1 ADMISSION DATE:  05/12/2023, CONSULTATION DATE: 05/12/2023 REFERRING MD: Emergency department physician, CHIEF COMPLAINT: Acute respiratory failure  History of Present Illness:  83 year old female who missed dialysis on Wednesday and presents with volume overload and respiratory distress.  Initially she was tried on BiPAP but vomited therefore she required intubation and full mechanical ventilatory support.  Nephrology has been contacted she will need hemodialysis as soon as possible.  She is sedated properly.  She is a pleasant with health issues that are well-documented below.  Noted her CT scan shows bilateral airspace disease she is currently on Zithromax  and Rocephin .  Pertinent  Medical History   Past Medical History:  Diagnosis Date   Anemia    Arthritis    Cancer (HCC)    urterine- surgical removal only tratment   Environmental and seasonal allergies    affects sinuses   ESRD on hemodialysis (HCC)    MWF [facility]   Heart murmur    no one has mentioned it lately- had it as a child   Hypertension    Limb cramps    leg, thigh, hands   Pneumonia    young   Shortness of breath dyspnea    With exerion     Significant Hospital Events: Including procedures, antibiotic start and stop dates in addition to other pertinent events   1/9 intubated and admitted to ICU  Interim History / Subjective:  Wide awake on Fentanyl  gtt. Follows commands. Started weaning this AM from vent, tolerating so far.  Objective   Blood pressure (!) 90/47, pulse (!) 54, temperature 98.8 F (37.1 C), temperature source Oral, resp. rate 14, height 5' (1.524 m), weight 73.8 kg, SpO2 100%.    Vent Mode: PRVC FiO2 (%):  [50 %-100 %] 50 % Set Rate:  [16 bmp-26 bmp] 16 bmp Vt Set:  [270 mL-360 mL] 360 mL PEEP:  [5 cmH20-12 cmH20] 10 cmH20 Plateau Pressure:  [24 cmH20-30 cmH20] 27 cmH20   Intake/Output Summary (Last 24 hours) at 05/13/2023  0716 Last data filed at 05/13/2023 0700 Gross per 24 hour  Intake 1519.53 ml  Output 3000 ml  Net -1480.47 ml   Filed Weights   05/12/23 0949 05/12/23 1119 05/13/23 0500  Weight: 74.4 kg 74.4 kg 73.8 kg    Examination:  General: Elderly female on vent, awake and anxious HENT: [place]/AT, ETT in place Lungs: CTAB Cardiovascular: RRR, 5/6 holosystolic murmur radiating into carotids Abdomen: Soft obese Extremities: 1+ edema Neuro: Awake on vent, follows commands   Assessment & Plan:   Acute respiratory failure requiring intubation in the setting of volume overload she has end-stage renal disease and missed dialysis on Wednesday.  She is initially placed on BiPAP but she vomited therefore was transitioned to endotracheal tube per emergency department physician.  - SBT this AM. - Hopeful for extubation this AM. - Volume removal per HD. - Bronchial hygiene. - Empiric Unasyn . - CXR intermittently.  Troponin bump up to 264 - felt to be 2/2 demand in setting of above. Echo obtained, EF 60-65%, G1DD, severe AS, mod pericardial effusion, aortic valve masses noted. EKG with sinus tach. Moderate pericardial effusion per echo - no tamponade physiology. - Supportive care. - Might need to consider getting cards to see inpatient regarding degree of her AS and to establish care for outpatient follow up given dCHF, effusion, etc.  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 -  Will add blood cultures empirically. - Add PCT. - Will discuss with MD regarding having cards review.  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 - Continue PTA Midodirine. - Continue NE, wean to off. Currently at 6mcg/min. - Wean sedation.  ESRD on HD. Hypocalcemia. - Appreciate nephrology assistance. - 1g Ca gluconate.  Chronic anemia. - Transfuse for Hgb < 7.   Best Practice (right click and Reselect all SmartList Selections daily)   Diet/type: NPO DVT prophylaxis prophylactic  heparin   Pressure ulcer(s): N/A GI prophylaxis: H2B Lines: N/A Foley:  N/A Code Status:  full code Last date of multidisciplinary goals of care discussion [tbd]  Critical care time: 30 min   Sammi Gore, PA - C [facility] For pager details, please see AMION or use Epic chat  After 1900, please call [facility] for cross coverage needs 05/13/2023, 7:56 AM

## 2023-05-13 NOTE — Progress Notes (Signed)
 PCCM Brief Note  Vomiting x 2  Zofran  already ordered. Will add Reglan . HOB up > 30.   Sammi Gore, PA - C [facility] For pager details, please see AMION or use Epic chat  After 1900, please call [facility] for cross coverage needs 05/13/2023, 11:38 AM

## 2023-05-13 NOTE — Progress Notes (Addendum)
 eLink Physician-Brief Progress Note Patient Name: [name] DOB: 1940-10-02 MRN: [id number]   Date of Service  05/13/2023  HPI/Events of Note  83 year old female with end-stage renal disease on hemodialysis who missed hemodialysis session on Wednesday, presented with increasing shortness of breath, initially she was on BiPAP but could not tolerate due to vomiting, she was intubated, PCCM was consulted for help evaluation medical management   Ventilated and on sedation/analgesia infusion  eICU Interventions  change prn fentanyl  IVP to bolus from bag   0146 -updated vent order to reflect desire for daily spontaneous awakening trials  Intervention Category Intermediate Interventions: Pain - evaluation and management Minor Interventions: Routine modifications to care plan (e.g. PRN medications for pain, fever)  [name] 05/13/2023, 12:34 AM

## 2023-05-13 NOTE — Plan of Care (Signed)

## 2023-05-13 NOTE — Plan of Care (Signed)
  Problem: Education: Goal: Knowledge of General Education information will improve Description: Including pain rating scale, medication(s)/side effects and non-pharmacologic comfort measures Outcome: Progressing   Problem: Clinical Measurements: Goal: Diagnostic test results will improve Outcome: Progressing Goal: Cardiovascular complication will be avoided Outcome: Progressing   Problem: Coping: Goal: Level of anxiety will decrease Outcome: Progressing   Problem: Pain Management: Goal: General experience of comfort will improve Outcome: Progressing   Problem: Safety: Goal: Ability to remain free from injury will improve Outcome: Progressing

## 2023-05-13 NOTE — Procedures (Signed)
 Extubation Procedure Note  Patient Details:   Name: [name] DOB: 1941-02-22 MRN: [id number]   Airway Documentation:    Vent end date: 05/13/23 Vent end time: 0822   Evaluation  O2 sats: stable throughout Complications: No apparent complications Patient did tolerate procedure well. Bilateral Breath Sounds: Clear, Diminished   Yes  Patient was extubated to a 4L [place] without any complications, dyspnea or stridor noted. Positive cuff leak was noted prior to extubation.   Daleen Steinhaus L 05/13/2023, 8:22 AM

## 2023-05-14 ENCOUNTER — Inpatient Hospital Stay (HOSPITAL_COMMUNITY): Payer: Medicare PPO

## 2023-05-14 DIAGNOSIS — R0602 Shortness of breath: Secondary | ICD-10-CM | POA: Diagnosis not present

## 2023-05-14 LAB — BASIC METABOLIC PANEL
Anion gap: 17 — ABNORMAL HIGH (ref 5–15)
BUN: 51 mg/dL — ABNORMAL HIGH (ref 8–23)
CO2: 23 mmol/L (ref 22–32)
Calcium: 8.5 mg/dL — ABNORMAL LOW (ref 8.9–10.3)
Chloride: 90 mmol/L — ABNORMAL LOW (ref 98–111)
Creatinine, Ser: 9.08 mg/dL — ABNORMAL HIGH (ref 0.44–1.00)
GFR, Estimated: 4 mL/min — ABNORMAL LOW (ref >60)
Glucose, Bld: 89 mg/dL (ref 70–99)
Potassium: 5 mmol/L (ref 3.5–5.1)
Sodium: 130 mmol/L — ABNORMAL LOW (ref 135–145)

## 2023-05-14 LAB — PROCALCITONIN: Procalcitonin: 3.89 ng/mL

## 2023-05-14 LAB — CBC
HCT: 27.6 % — ABNORMAL LOW (ref 36.0–46.0)
Hemoglobin: 9.3 g/dL — ABNORMAL LOW (ref 12.0–15.0)
MCH: 34.3 pg — ABNORMAL HIGH (ref 26.0–34.0)
MCHC: 33.7 g/dL (ref 30.0–36.0)
MCV: 101.8 fL — ABNORMAL HIGH (ref 80.0–100.0)
Platelets: 83 10*3/uL — ABNORMAL LOW (ref 150–400)
RBC: 2.71 MIL/uL — ABNORMAL LOW (ref 3.87–5.11)
RDW: 13 % (ref 11.5–15.5)
WBC: 6.3 10*3/uL (ref 4.0–10.5)
nRBC: 0 % (ref 0.0–0.2)

## 2023-05-14 LAB — PHOSPHORUS: Phosphorus: 7.5 mg/dL — ABNORMAL HIGH (ref 2.5–4.6)

## 2023-05-14 LAB — GLUCOSE, CAPILLARY
Glucose-Capillary: 88 mg/dL (ref 70–99)
Glucose-Capillary: 72 mg/dL (ref 70–99)
Glucose-Capillary: 107 mg/dL — ABNORMAL HIGH (ref 70–99)
Glucose-Capillary: 115 mg/dL — ABNORMAL HIGH (ref 70–99)
Glucose-Capillary: 85 mg/dL (ref 70–99)

## 2023-05-14 LAB — CULTURE, RESPIRATORY W GRAM STAIN: Culture: NORMAL

## 2023-05-14 LAB — MAGNESIUM: Magnesium: 2.2 mg/dL (ref 1.7–2.4)

## 2023-05-14 LAB — LACTIC ACID, PLASMA: Lactic Acid, Venous: 2 mmol/L (ref 0.5–1.9)

## 2023-05-14 LAB — CORTISOL: Cortisol, Plasma: 17.7 ug/dL

## 2023-05-14 MED ORDER — ALBUMIN HUMAN 25 % IV SOLN
25.0000 g | Freq: Four times a day (QID) | INTRAVENOUS | Status: AC
  Administered 2023-05-14: 12.5 g via INTRAVENOUS
  Administered 2023-05-14 – 2023-05-15 (×3): 25 g via INTRAVENOUS
  Filled 2023-05-14 (×4): qty 100

## 2023-05-14 MED ORDER — CALCIUM ACETATE (PHOS BINDER) 667 MG PO CAPS
2001.0000 mg | ORAL_CAPSULE | Freq: Three times a day (TID) | ORAL | Status: DC
  Administered 2023-05-14 – 2023-05-16 (×4): 2001 mg via ORAL
  Filled 2023-05-14 (×5): qty 3

## 2023-05-14 MED ORDER — MIDODRINE HCL 5 MG PO TABS
10.0000 mg | ORAL_TABLET | Freq: Three times a day (TID) | ORAL | Status: DC
  Administered 2023-05-14 – 2023-05-18 (×10): 10 mg via ORAL
  Filled 2023-05-14 (×11): qty 2

## 2023-05-14 NOTE — Progress Notes (Signed)
 [facility] Progress Note  Subjective: extubated and alert and interacting, no c/o's. Seen in room, and remains on levo gtt at 2 mcg / hr.   Vitals:   05/14/23 1400 05/14/23 1500 05/14/23 1546 05/14/23 1600  BP:  139/65  (!) 119/52  Pulse: (!) 52 (!) 57    Resp: (!) 21 19 18  (!) 33  Temp:      TempSrc:      SpO2: 97% 95%    Weight:      Height:        Exam: Gen alert, no distress Sclera anicteric, throat clear  No jvd or bruits Chest clear bilat to bases RRR no MRG Abd soft ntnd no mass or ascites +bs Ext 1+ bilat pretib edema Neuro is alert, Ox 3 , nf    AVF LUA +bruit     OP HD: MWF [facility] 3.5h   350/1.5   68.9kgkg  2/2 bath  L AVF  Heparin  2200  - last OP HD 1/06, post wt 72.7kg - missed 1/08 HD yesterday - rocaltrol  1.25 mcg three times per week - no esa, last Hb 11.2       Assessment/ Plan: AHRF / vol overload- missed HD on Wed, admit CXR w/ sig bilat pulm edema. Had HD 1/09 w/ 2.9 L UF and pt extubated the following am 1/10. Looks much better.  ESRD - on HD MWF. Missed HD 1/08. Had HD here Thursday. Plan HD today off schedule in ICU.  Hypotension - weaning levo gtt down. Midodrine  ^'d to 10 tid. Better.  Volume - mild LE edema resolved. Wt's are now 1kg under dry. Goal 2-2.5 L, but will have to see how the BP's tolerate hd.  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 Hb 10-12 here. Follow.  Secondary hyperparathyroidism - cont phoslo  3 ac, rocaltrol  1.25 w/ hd. CCa in range.Phos high at 9. Follow.      Myer Fret MD  [facility] 05/14/2023, 4:21 PM  Recent Labs  Lab 05/12/23 0532 05/12/23 0921 05/12/23 1105 05/12/23 1213 05/13/23 0258 05/13/23 0453 05/14/23 0415  HGB 6.6*   < >  --    < > 9.8* 9.2* 9.3*  ALBUMIN  3.6  --   --   --   --   --   --   CALCIUM  9.4  --   --   --  8.8*  --  8.5*  PHOS  --   --  9.2*  --   --   --  7.5*  CREATININE 10.99*  --  11.29*  --  7.47*  --  9.08*  K 4.9   < >  --    < > 4.5 4.7 5.0   < > = values in this  interval not displayed.   No results for input(s): IRON, TIBC, FERRITIN in the last 168 hours. Inpatient medications:  sodium chloride    Intravenous Once   calcitRIOL   1.25 mcg Oral Q M,W,F-1800   Chlorhexidine  Gluconate Cloth  6 each Topical Q0600   heparin   5,000 Units Subcutaneous Q8H   metoCLOPramide  (REGLAN ) injection  5 mg Intravenous Q8H   midodrine   10 mg Oral TID WC   polyethylene glycol  17 g Oral Daily    albumin  human 12.5 g (05/14/23 1338)   ampicillin -sulbactam (UNASYN ) IV Stopped (05/13/23 1807)   anticoagulant sodium citrate      norepinephrine  (LEVOPHED ) Adult infusion Stopped (05/14/23 0959)   acetaminophen , alteplase , anticoagulant sodium citrate , docusate sodium , feeding  supplement (NEPRO CARB STEADY), heparin , lidocaine  (PF), lidocaine -prilocaine , ondansetron  (ZOFRAN ) IV, mouth rinse, pentafluoroprop-tetrafluoroeth, polyethylene glycol

## 2023-05-14 NOTE — Plan of Care (Signed)
  Problem: Education: Goal: Knowledge of General Education information will improve Description: Including pain rating scale, medication(s)/side effects and non-pharmacologic comfort measures Outcome: Progressing   Problem: Clinical Measurements: Goal: Ability to maintain clinical measurements within normal limits will improve Outcome: Progressing Goal: Will remain free from infection Outcome: Progressing Goal: Diagnostic test results will improve Outcome: Progressing Goal: Respiratory complications will improve Outcome: Progressing Goal: Cardiovascular complication will be avoided Outcome: Progressing   Problem: Safety: Goal: Ability to remain free from injury will improve Outcome: Progressing   Problem: Skin Integrity: Goal: Risk for impaired skin integrity will decrease Outcome: Progressing   

## 2023-05-14 NOTE — Progress Notes (Signed)
 NAME:  [name], MRN:  [id number], DOB:  1940-11-21, LOS: 2 ADMISSION DATE:  05/12/2023, CONSULTATION DATE: 05/12/2023 REFERRING MD: Emergency department physician, CHIEF COMPLAINT: Acute respiratory failure  History of Present Illness:  83 year old female who missed dialysis on Wednesday and presents with volume overload and respiratory distress.  Initially she was tried on BiPAP but vomited therefore she required intubation and full mechanical ventilatory support.  Nephrology has been contacted she will need hemodialysis as soon as possible.  She is sedated properly.  She is a pleasant with health issues that are well-documented below.  Noted her CT scan shows bilateral airspace disease she is currently on Zithromax  and Rocephin .  Pertinent  Medical History   Past Medical History:  Diagnosis Date   Anemia    Arthritis    Cancer (HCC)    urterine- surgical removal only tratment   Environmental and seasonal allergies    affects sinuses   ESRD on hemodialysis (HCC)    MWF [facility]   Heart murmur    no one has mentioned it lately- had it as a child   Hypertension    Limb cramps    leg, thigh, hands   Pneumonia    young   Shortness of breath dyspnea    With exerion     Significant Hospital Events: Including procedures, antibiotic start and stop dates in addition to other pertinent events   1/9 intubated and admitted to ICU  Interim History / Subjective:  HA this am On levophed  low dose  Objective   Blood pressure (!) 116/31, pulse (!) 52, temperature 98.3 F (36.8 C), temperature source Oral, resp. rate 17, height 5' (1.524 m), weight 67.8 kg, SpO2 100%.    Vent Mode: CPAP;PSV FiO2 (%):  [50 %] 50 % PEEP:  [5 cmH20] 5 cmH20 Pressure Support:  [5 cmH20] 5 cmH20   Intake/Output Summary (Last 24 hours) at 05/14/2023 0725 Last data filed at 05/14/2023 0600 Gross per 24 hour  Intake 471.82 ml  Output --  Net 471.82 ml   Filed Weights   05/12/23 1119 05/13/23 0500  05/14/23 0500  Weight: 74.4 kg 73.8 kg 67.8 kg    Examination:  No distress Ext warm Globally weak +murmur No edema  Plts are down Hgb stable Pct up but renal failure Afebrile On unasyn  Blood cultures neg so far  Assessment & Plan:  Acute hypoxemic resp failure thought mostly fluid overload; treating for concurrent possible aspiration, extubated 05/12/22 Abnormal echocardiogram suspected severe calcific valvular disease rather than IE, cultures are sent ESRD on HD Persistent shock state of unclear etiology Chronic renal vasoplegia Frail elderly Muscular deconditioning N/V poor PO Thrombocytopenia- keep an eye on  - Increase midodrine , watch HR - Check cortisol, f/u blood cultures - Albumin  25% 100g - Trend Pct, fever curve - Levo for MAP 65 - HD per nephro, may need CRRT - PT/OT - Check KUB, lactate - Remains in ICU until BP improves  Best Practice (right click and Reselect all SmartList Selections daily)   Diet/type: advance as tolerated DVT prophylaxis prophylactic heparin   Pressure ulcer(s): N/A GI prophylaxis: H2B Lines: N/A Foley:  N/A Code Status:  full code Last date of multidisciplinary goals of care discussion [if does not turn around may need GOC discussion]  32 min cc time Rolan Sharps MD [facility] For pager details, please see AMION or use Epic chat  After 1900, please call ELINK for cross coverage needs 05/14/2023, 7:25 AM

## 2023-05-14 NOTE — Plan of Care (Signed)

## 2023-05-15 ENCOUNTER — Inpatient Hospital Stay (HOSPITAL_COMMUNITY): Payer: Medicare PPO

## 2023-05-15 DIAGNOSIS — R0902 Hypoxemia: Secondary | ICD-10-CM | POA: Diagnosis not present

## 2023-05-15 DIAGNOSIS — R0602 Shortness of breath: Secondary | ICD-10-CM | POA: Diagnosis not present

## 2023-05-15 LAB — RENAL FUNCTION PANEL
Albumin: 4.1 g/dL (ref 3.5–5.0)
Albumin: 3.9 g/dL (ref 3.5–5.0)
Anion gap: 22 — ABNORMAL HIGH (ref 5–15)
Anion gap: 16 — ABNORMAL HIGH (ref 5–15)
BUN: 63 mg/dL — ABNORMAL HIGH (ref 8–23)
BUN: 49 mg/dL — ABNORMAL HIGH (ref 8–23)
CO2: 20 mmol/L — ABNORMAL LOW (ref 22–32)
CO2: 23 mmol/L (ref 22–32)
Calcium: 9.4 mg/dL (ref 8.9–10.3)
Calcium: 9 mg/dL (ref 8.9–10.3)
Chloride: 89 mmol/L — ABNORMAL LOW (ref 98–111)
Chloride: 94 mmol/L — ABNORMAL LOW (ref 98–111)
Creatinine, Ser: 10.67 mg/dL — ABNORMAL HIGH (ref 0.44–1.00)
Creatinine, Ser: 7.6 mg/dL — ABNORMAL HIGH (ref 0.44–1.00)
GFR, Estimated: 3 mL/min — ABNORMAL LOW (ref >60)
GFR, Estimated: 5 mL/min — ABNORMAL LOW (ref >60)
Glucose, Bld: 90 mg/dL (ref 70–99)
Glucose, Bld: 82 mg/dL (ref 70–99)
Phosphorus: 8.1 mg/dL — ABNORMAL HIGH (ref 2.5–4.6)
Phosphorus: 5.1 mg/dL — ABNORMAL HIGH (ref 2.5–4.6)
Potassium: 5.5 mmol/L — ABNORMAL HIGH (ref 3.5–5.1)
Potassium: 4.9 mmol/L (ref 3.5–5.1)
Sodium: 131 mmol/L — ABNORMAL LOW (ref 135–145)
Sodium: 133 mmol/L — ABNORMAL LOW (ref 135–145)

## 2023-05-15 LAB — CBC
HCT: 27 % — ABNORMAL LOW (ref 36.0–46.0)
Hemoglobin: 9 g/dL — ABNORMAL LOW (ref 12.0–15.0)
MCH: 33.7 pg (ref 26.0–34.0)
MCHC: 33.3 g/dL (ref 30.0–36.0)
MCV: 101.1 fL — ABNORMAL HIGH (ref 80.0–100.0)
Platelets: 87 10*3/uL — ABNORMAL LOW (ref 150–400)
RBC: 2.67 MIL/uL — ABNORMAL LOW (ref 3.87–5.11)
RDW: 13.2 % (ref 11.5–15.5)
WBC: 7.1 10*3/uL (ref 4.0–10.5)
nRBC: 0.3 % — ABNORMAL HIGH (ref 0.0–0.2)

## 2023-05-15 LAB — PROCALCITONIN: Procalcitonin: 4.03 ng/mL

## 2023-05-15 LAB — COOXEMETRY PANEL
Carboxyhemoglobin: 0.9 % (ref 0.5–1.5)
Methemoglobin: <0.7 % (ref 0.0–1.5)
O2 Saturation: 84.5 %
Total hemoglobin: 8.9 g/dL — ABNORMAL LOW (ref 12.0–16.0)

## 2023-05-15 LAB — GLUCOSE, CAPILLARY
Glucose-Capillary: 108 mg/dL — ABNORMAL HIGH (ref 70–99)
Glucose-Capillary: 82 mg/dL (ref 70–99)
Glucose-Capillary: 83 mg/dL (ref 70–99)
Glucose-Capillary: 88 mg/dL (ref 70–99)
Glucose-Capillary: 92 mg/dL (ref 70–99)

## 2023-05-15 MED ORDER — HALOPERIDOL LACTATE 5 MG/ML IJ SOLN
INTRAMUSCULAR | Status: AC
  Administered 2023-05-15: 2 mg via INTRAVENOUS
  Filled 2023-05-15: qty 2

## 2023-05-15 MED ORDER — SODIUM CHLORIDE 0.9 % IV SOLN
3.0000 g | Freq: Two times a day (BID) | INTRAVENOUS | Status: AC
  Administered 2023-05-15 – 2023-05-17 (×6): 3 g via INTRAVENOUS
  Filled 2023-05-15 (×6): qty 8

## 2023-05-15 MED ORDER — BOOST / RESOURCE BREEZE PO LIQD CUSTOM
1.0000 | Freq: Three times a day (TID) | ORAL | Status: DC
Start: 2023-05-15 — End: 2023-05-16

## 2023-05-15 MED ORDER — HEPARIN SODIUM (PORCINE) 1000 UNIT/ML DIALYSIS
1000.0000 [IU] | INTRAMUSCULAR | Status: DC | PRN
  Administered 2023-05-17: 3000 [IU] via INTRAVENOUS_CENTRAL
  Filled 2023-05-15 (×2): qty 2
  Filled 2023-05-15: qty 6
  Filled 2023-05-15: qty 3

## 2023-05-15 MED ORDER — HALOPERIDOL LACTATE 5 MG/ML IJ SOLN: 2.0000 mg | INTRAMUSCULAR | Status: DC | PRN

## 2023-05-15 MED ORDER — PRISMASOL BGK 4/2.5 32-4-2.5 MEQ/L EC SOLN
Status: DC
  Filled 2023-05-15 (×10): qty 5000

## 2023-05-15 MED ORDER — HALOPERIDOL LACTATE 5 MG/ML IJ SOLN: 2.5000 mg | Freq: Four times a day (QID) | INTRAMUSCULAR | Status: DC | PRN

## 2023-05-15 MED ORDER — PRISMASOL BGK 4/2.5 32-4-2.5 MEQ/L REPLACEMENT SOLN
Status: DC
  Filled 2023-05-15 (×3): qty 5000

## 2023-05-15 MED ORDER — CHLORHEXIDINE GLUCONATE CLOTH 2 % EX PADS
6.0000 | MEDICATED_PAD | Freq: Every day | CUTANEOUS | Status: DC
  Administered 2023-05-15 – 2023-05-21 (×7): 6 via TOPICAL

## 2023-05-15 MED ORDER — SODIUM CHLORIDE 0.9 % IV SOLN
350.0000 [IU]/h | INTRAVENOUS | Status: DC
  Administered 2023-05-15: 350 [IU]/h via INTRAVENOUS_CENTRAL
  Administered 2023-05-16: 525 [IU]/h via INTRAVENOUS_CENTRAL
  Administered 2023-05-16: 550 [IU]/h via INTRAVENOUS_CENTRAL
  Filled 2023-05-15 (×2): qty 10000
  Filled 2023-05-15: qty 2

## 2023-05-15 MED ORDER — PRISMASOL BGK 4/2.5 32-4-2.5 MEQ/L REPLACEMENT SOLN
Status: DC
Start: 2023-05-15 — End: 2023-05-18
  Filled 2023-05-15 (×3): qty 5000

## 2023-05-15 NOTE — Procedures (Signed)
 I have reviewed the pt's condition and adjusted the CRRT session to match the needs of the patient.  Vinson Moselle MD  [facility] 05/15/2023, 12:20 PM

## 2023-05-15 NOTE — Plan of Care (Signed)

## 2023-05-15 NOTE — Plan of Care (Signed)
?  Problem: Education: ?Goal: Knowledge of General Education information will improve ?Description: Including pain rating scale, medication(s)/side effects and non-pharmacologic comfort measures ?Outcome: Progressing ?  ?Problem: Clinical Measurements: ?Goal: Ability to maintain clinical measurements within normal limits will improve ?Outcome: Progressing ?Goal: Will remain free from infection ?Outcome: Progressing ?Goal: Diagnostic test results will improve ?Outcome: Progressing ?Goal: Respiratory complications will improve ?Outcome: Progressing ?Goal: Cardiovascular complication will be avoided ?Outcome: Progressing ?  ?Problem: Nutrition: ?Goal: Adequate nutrition will be maintained ?Outcome: Progressing ?  ?Problem: Safety: ?Goal: Ability to remain free from injury will improve ?Outcome: Progressing ?  ?Problem: Skin Integrity: ?Goal: Risk for impaired skin integrity will decrease ?Outcome: Progressing ?  ?

## 2023-05-15 NOTE — Procedures (Signed)
 Central Venous Catheter Insertion Procedure Note  Samyrah Bruster  [id number]  04-Jun-1940  Date:05/15/23  Time:8:21 AM   Provider Performing:Nellie Chevalier JAYSON Claudene   Procedure: Insertion of Non-tunneled Central Venous [phone]) with US  guidance ([id number])   Indication(s) Hemodialysis  Consent Risks of the procedure as well as the alternatives and risks of each were explained to the patient and/or caregiver.  Consent for the procedure was obtained and is signed in the bedside chart  Anesthesia Topical only with 1% lidocaine    Timeout Verified patient identification, verified procedure, site/side was marked, verified correct patient position, special equipment/implants available, medications/allergies/relevant history reviewed, required imaging and test results available.  Sterile Technique Maximal sterile technique including full sterile barrier drape, hand hygiene, sterile gown, sterile gloves, mask, hair covering, sterile ultrasound probe cover (if used).  Procedure Description Area of catheter insertion was cleaned with chlorhexidine  and draped in sterile fashion.  With real-time ultrasound guidance a HD catheter was placed into the right internal jugular vein. Nonpulsatile blood flow and easy flushing noted in all ports.  The catheter was sutured in place and sterile dressing applied.  Complications/Tolerance None; patient tolerated the procedure well. Chest X-ray is ordered to verify placement for internal jugular or subclavian cannulation.   Chest x-ray is not ordered for femoral cannulation.  EBL Minimal  Specimen(s) None

## 2023-05-15 NOTE — Progress Notes (Signed)
 PT Cancellation Note  Patient Details Name: [name] MRN: [id number] DOB: [birth year]   Cancelled Treatment:    Reason Eval/Treat Not Completed: Medical issues which prohibited therapy (Volume overload continues to worsen and pt was just started on CRRT. Nursing asked to hold today. Will continue to follow up as able and appropriate.)  Dorothyann Maier, DPT, CLT  Acute Rehabilitation Services Office: [phone] (Secure chat preferred)   Dorothyann VEAR Maier 05/15/2023, 11:11 AM

## 2023-05-15 NOTE — Progress Notes (Signed)
 [facility] Progress Note  Subjective: was not able to get dialyzed last night. This am CCM placed temp HD cath for CRRT due to worsening pulm edema/ vol issues.   Vitals:   05/15/23 0545 05/15/23 0600 05/15/23 0615 05/15/23 0800  BP: (!) 101/32 (!) 108/36 (!) 100/27   Pulse: (!) 49 (!) 47 (!) 51   Resp: (!) 21 (!) 21 18   Temp:    97.8 F (36.6 C)  TempSrc:    Axillary  SpO2: 98% 99% 100%   Weight:      Height:        Exam: Gen alert, no distress, pt appears uncomfortable Sclera anicteric, throat clear  No jvd or bruits Chest basilar crackles on R RRR no MRG Abd soft ntnd no mass or ascites +bs Ext 1+ bilat pretib edema Neuro is alert, Ox 3 , nf    AVF LUA +bruit     OP HD: MWF [facility] 3.5h   350/1.5   68.9kgkg  2/2 bath  L AVF  Heparin  2200  - last OP HD 1/06, post wt 72.7kg - missed 1/08 HD yesterday - rocaltrol  1.25 mcg three times per week - no esa, last Hb 11.2       Assessment/ Plan: AHRF / vol overload- missed HD on Wed, admit CXR w/ sig bilat pulm edema. Had HD 1/09 w/ 2.9 L UF and pt extubated the following am 1/10. Now worse again, CXR worsening pulm edema. Echo showed G1DD, EF 60-65% and mod-severe AS. Plan is for CRRT to start today.   ESRD - on HD MWF. Missed outpt HD 1/08. Had HD here Thursday. Sat iHD not done due to high census. To start on CRRT today 1/12.  Hypotension - BP's better, levo weaned off yesterday. Cont midodrine  Volume - no edema on exam, but +worsening CXR today. Max UF w/ CRRT 50-125 cc/hr for now.  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 Hb 9-11 here. Follow.  Secondary hyperparathyroidism - cont phoslo  3 ac, rocaltrol  1.25 w/ hd. CCa in range.Phos high at 9. Follow.  Aortic stenosis - by echo this admit, severe calcific disease vs IE. Blood cx's sent.      Myer Fret MD  [facility] 05/15/2023, 10:41 AM  Recent Labs  Lab 05/12/23 0532 05/12/23 0921 05/14/23 0415 05/15/23 0226  HGB 6.6*   < > 9.3* 9.0*  ALBUMIN  3.6  --    --  4.1  CALCIUM  9.4   < > 8.5* 9.4  PHOS  --    < > 7.5* 8.1*  CREATININE 10.99*   < > 9.08* 10.67*  K 4.9   < > 5.0 5.5*   < > = values in this interval not displayed.   No results for input(s): IRON, TIBC, FERRITIN in the last 168 hours. Inpatient medications:  sodium chloride    Intravenous Once   calcitRIOL   1.25 mcg Oral Q M,W,F-1800   calcium  acetate  2,001 mg Oral TID WC   haloperidol  lactate       heparin   5,000 Units Subcutaneous Q8H   metoCLOPramide  (REGLAN ) injection  5 mg Intravenous Q8H   midodrine   10 mg Oral TID WC   polyethylene glycol  17 g Oral Daily     prismasol  BGK 4/2.5      prismasol  BGK 4/2.5     ampicillin -sulbactam (UNASYN ) IV     heparin  10,000 units/ 20 mL infusion syringe     norepinephrine  (LEVOPHED ) Adult infusion Stopped (05/14/23  1030)   prismasol  BGK 4/2.5     acetaminophen , docusate sodium , haloperidol  lactate, haloperidol  lactate, heparin , ondansetron  (ZOFRAN ) IV, mouth rinse, polyethylene glycol

## 2023-05-15 NOTE — Progress Notes (Signed)
 NAME:  [name], MRN:  [id number], DOB:  February 12, 1941, LOS: 3 ADMISSION DATE:  05/12/2023, CONSULTATION DATE: 05/12/2023 REFERRING MD: Emergency department physician, CHIEF COMPLAINT: Acute respiratory failure  History of Present Illness:  83 year old female who missed dialysis on Wednesday and presents with volume overload and respiratory distress.  Initially she was tried on BiPAP but vomited therefore she required intubation and full mechanical ventilatory support.  Nephrology has been contacted she will need hemodialysis as soon as possible.  She is sedated properly.  She is a pleasant with health issues that are well-documented below.  Noted her CT scan shows bilateral airspace disease she is currently on Zithromax  and Rocephin .  Pertinent  Medical History   Past Medical History:  Diagnosis Date   Anemia    Arthritis    Cancer (HCC)    urterine- surgical removal only tratment   Environmental and seasonal allergies    affects sinuses   ESRD on hemodialysis (HCC)    MWF [facility]   Heart murmur    no one has mentioned it lately- had it as a child   Hypertension    Limb cramps    leg, thigh, hands   Pneumonia    young   Shortness of breath dyspnea    With exerion     Significant Hospital Events: Including procedures, antibiotic start and stop dates in addition to other pertinent events   1/9 intubated and admitted to ICU  Interim History / Subjective:  Having nausea No HD done yesterday BP remains marginal  Objective   Blood pressure (!) 100/27, pulse (!) 51, temperature 98.6 F (37 C), temperature source Axillary, resp. rate 18, height 5' (1.524 m), weight 68.1 kg, SpO2 100%.        Intake/Output Summary (Last 24 hours) at 05/15/2023 0735 Last data filed at 05/15/2023 0400 Gross per 24 hour  Intake 465.18 ml  Output --  Net 465.18 ml   Filed Weights   05/13/23 0500 05/14/23 0500 05/15/23 0421  Weight: 73.8 kg 67.8 kg 68.1 kg    Examination:  No distress,  nauseous +JVD Moves to command Hard of hearing LUE fistula Minimal edema  Labs worsening Pct drifting up; blood culture neg, no fever Plts stable H/H stable Cortisol appropriate  Assessment & Plan:  Acute hypoxemic resp failure thought mostly fluid overload; treating for concurrent possible aspiration, extubated 05/12/22 Abnormal echocardiogram suspected severe calcific valvular disease rather than IE, cultures are sent ESRD on HD Persistent shock state on top of chronic renal vasoplegia Frail elderly Muscular deconditioning N/V poor PO- persistent despite reglan ; KUB okay, moving bowels; query related to volume Thrombocytopenia- keep an eye on  - Continue midodrine , watch HR - Unasyn  x 5-7 days - Trend Pct, WBC, fever curve - Levo for MAP 65 - Temp HD line for CRRT; check coox and CVP on it - PT/OT - If does not rebound when we achieve euvolemia need to consider palliative consult for end stage heart/renal disease  Best Practice (right click and Reselect all SmartList Selections daily)   Diet/type: advance as tolerated DVT prophylaxis prophylactic heparin   Pressure ulcer(s): N/A GI prophylaxis: H2B Lines: N/A Foley:  N/A Code Status:  full code Last date of multidisciplinary goals of care discussion [if does not turn around may need GOC discussion]  34 min cc time Rolan Sharps MD [facility] For pager details, please see AMION or use Epic chat  After 1900, please call ELINK for cross coverage needs 05/15/2023,  7:35 AM

## 2023-05-16 DIAGNOSIS — J9602 Acute respiratory failure with hypercapnia: Secondary | ICD-10-CM | POA: Diagnosis not present

## 2023-05-16 DIAGNOSIS — J9601 Acute respiratory failure with hypoxia: Secondary | ICD-10-CM | POA: Diagnosis not present

## 2023-05-16 LAB — RENAL FUNCTION PANEL
Albumin: 3.7 g/dL (ref 3.5–5.0)
Albumin: 3.6 g/dL (ref 3.5–5.0)
Anion gap: 13 (ref 5–15)
Anion gap: 12 (ref 5–15)
BUN: 32 mg/dL — ABNORMAL HIGH (ref 8–23)
BUN: 21 mg/dL (ref 8–23)
CO2: 23 mmol/L (ref 22–32)
CO2: 26 mmol/L (ref 22–32)
Calcium: 8.6 mg/dL — ABNORMAL LOW (ref 8.9–10.3)
Calcium: 9 mg/dL (ref 8.9–10.3)
Chloride: 99 mmol/L (ref 98–111)
Chloride: 100 mmol/L (ref 98–111)
Creatinine, Ser: 4.8 mg/dL — ABNORMAL HIGH (ref 0.44–1.00)
Creatinine, Ser: 2.98 mg/dL — ABNORMAL HIGH (ref 0.44–1.00)
GFR, Estimated: 9 mL/min — ABNORMAL LOW (ref >60)
GFR, Estimated: 15 mL/min — ABNORMAL LOW (ref >60)
Glucose, Bld: 86 mg/dL (ref 70–99)
Glucose, Bld: 131 mg/dL — ABNORMAL HIGH (ref 70–99)
Phosphorus: 3.5 mg/dL (ref 2.5–4.6)
Phosphorus: 1.7 mg/dL — ABNORMAL LOW (ref 2.5–4.6)
Potassium: 4.5 mmol/L (ref 3.5–5.1)
Potassium: 4.5 mmol/L (ref 3.5–5.1)
Sodium: 135 mmol/L (ref 135–145)
Sodium: 138 mmol/L (ref 135–145)

## 2023-05-16 LAB — BPAM RBC
Blood Product Expiration Date: 202502062359
Unit Type and Rh: 6200

## 2023-05-16 LAB — CBC
HCT: 24.7 % — ABNORMAL LOW (ref 36.0–46.0)
Hemoglobin: 8.2 g/dL — ABNORMAL LOW (ref 12.0–15.0)
MCH: 34.2 pg — ABNORMAL HIGH (ref 26.0–34.0)
MCHC: 33.2 g/dL (ref 30.0–36.0)
MCV: 102.9 fL — ABNORMAL HIGH (ref 80.0–100.0)
Platelets: 81 10*3/uL — ABNORMAL LOW (ref 150–400)
RBC: 2.4 MIL/uL — ABNORMAL LOW (ref 3.87–5.11)
RDW: 13.5 % (ref 11.5–15.5)
WBC: 5.2 10*3/uL (ref 4.0–10.5)
nRBC: 1.2 % — ABNORMAL HIGH (ref 0.0–0.2)

## 2023-05-16 LAB — GLUCOSE, CAPILLARY
Glucose-Capillary: 105 mg/dL — ABNORMAL HIGH (ref 70–99)
Glucose-Capillary: 113 mg/dL — ABNORMAL HIGH (ref 70–99)
Glucose-Capillary: 137 mg/dL — ABNORMAL HIGH (ref 70–99)
Glucose-Capillary: 145 mg/dL — ABNORMAL HIGH (ref 70–99)
Glucose-Capillary: 82 mg/dL (ref 70–99)
Glucose-Capillary: 88 mg/dL (ref 70–99)
Glucose-Capillary: 89 mg/dL (ref 70–99)

## 2023-05-16 LAB — TYPE AND SCREEN
ABO/RH(D): A POS
Antibody Screen: NEGATIVE
Unit division: 0

## 2023-05-16 LAB — APTT: aPTT: 111 s — ABNORMAL HIGH (ref 24–36)

## 2023-05-16 LAB — MAGNESIUM: Magnesium: 2.5 mg/dL — ABNORMAL HIGH (ref 1.7–2.4)

## 2023-05-16 MED ORDER — ENSURE ENLIVE PO LIQD
237.0000 mL | Freq: Three times a day (TID) | ORAL | Status: DC
  Administered 2023-05-16 – 2023-05-17 (×2): 237 mL via ORAL

## 2023-05-16 MED ORDER — BOOST / RESOURCE BREEZE PO LIQD CUSTOM: 1.0000 | Freq: Three times a day (TID) | ORAL | Status: DC

## 2023-05-16 MED ORDER — RENA-VITE PO TABS
1.0000 | ORAL_TABLET | Freq: Two times a day (BID) | ORAL | Status: DC
Start: 2023-05-16 — End: 2023-05-19
  Administered 2023-05-16 – 2023-05-18 (×6): 1 via ORAL
  Filled 2023-05-16 (×6): qty 1

## 2023-05-16 MED ORDER — ENSURE ENLIVE PO LIQD
237.0000 mL | Freq: Three times a day (TID) | ORAL | Status: DC
  Administered 2023-05-16 (×2): 237 mL via ORAL

## 2023-05-16 NOTE — Plan of Care (Signed)
  Problem: Clinical Measurements: Goal: Respiratory complications will improve Outcome: Progressing Goal: Cardiovascular complication will be avoided Outcome: Progressing   Problem: Activity: Goal: Risk for activity intolerance will decrease Outcome: Progressing   Problem: Nutrition: Goal: Adequate nutrition will be maintained Outcome: Progressing   Problem: Coping: Goal: Level of anxiety will decrease Outcome: Progressing   

## 2023-05-16 NOTE — Evaluation (Signed)
 Occupational Therapy Evaluation Patient Details Name: Nelida Mandarino MRN: [id number] DOB: 1941-01-18 Today's Date: 05/16/2023   History of Present Illness Patient is an 83 y/o admitted 05/12/23 due to volume overload and respiratory distress; attempted BIPAP but pt vomited and was intubated on full mechanical ventilatory support.  Pt was extubated on 05/13/23. Pt missed dialysis on Wednesday. PMH positive for ESRD on HD, HTN, anemia, endometrial cancer s/p TAH, and secondary hyperparathyroidism.   Clinical Impression   PTA, pt lived at home and reports being independent in ADL, driving, and cooking; takes herself to dialysis appts. Pt reports that recently her niece has been staying with her and performing cleaning tasks in the home. Upon eval, pt needing up to mod A for transfers and LB ADL. Due to significant change in functional status, recommending intensive multidisciplinary rehabilitation >3 hours/day to optimize safety and independence in ADL. Pt hopeful to progress to home.        If plan is discharge home, recommend the following: A lot of help with bathing/dressing/bathroom;A lot of help with walking and/or transfers;Help with stairs or ramp for entrance;Assist for transportation;Assistance with cooking/housework    Functional Status Assessment  Patient has had a recent decline in their functional status and demonstrates the ability to make significant improvements in function in a reasonable and predictable amount of time.  Equipment Recommendations  BSC/3in1;Other (comment) (RW)    Recommendations for Other Services Rehab consult     Precautions / Restrictions Restrictions Weight Bearing Restrictions Per Provider Order: No      Mobility Bed Mobility Overal bed mobility: Needs Assistance Bed Mobility: Supine to Sit     Supine to sit: Mod assist     General bed mobility comments: HHA for truncal elevation and scooting hips toward EOB    Transfers Overall transfer  level: Needs assistance Equipment used: 1 person hand held assist Transfers: Sit to/from Stand, Bed to chair/wheelchair/BSC Sit to Stand: Min assist Stand pivot transfers: Mod assist         General transfer comment: Min A for initial rise up from EOB; mod A for balance while stepping toward chair      Balance Overall balance assessment: Needs assistance Sitting-balance support: No upper extremity supported, Feet supported Sitting balance-Leahy Scale: Fair Sitting balance - Comments: able to sit unsupported ~4 mins prior to transfer. fatigues easily with mild posterior lean at EOB   Standing balance support: During functional activity Standing balance-Leahy Scale: Poor Standing balance comment: reliant on external assist                           ADL either performed or assessed with clinical judgement   ADL Overall ADL's : Needs assistance/impaired Eating/Feeding: Modified independent;Sitting   Grooming: Set up;Sitting   Upper Body Bathing: Set up;Sitting   Lower Body Bathing: Sit to/from stand;Maximal assistance   Upper Body Dressing : Set up;Sitting   Lower Body Dressing: Maximal assistance;Sit to/from stand   Toilet Transfer: Moderate assistance;+2 for safety/equipment;Stand-pivot Toilet Transfer Details (indicate cue type and reason): 1 person HHA; 2nd person present for         Functional mobility during ADLs: Moderate assistance (SPT to chair)       Vision Patient Visual Report: No change from baseline Vision Assessment?: No apparent visual deficits     Perception Perception: Not tested       Praxis Praxis: Not tested       Pertinent Vitals/Pain Pain Assessment Pain  Assessment: No/denies pain     Extremity/Trunk Assessment Upper Extremity Assessment Upper Extremity Assessment: Generalized weakness   Lower Extremity Assessment Lower Extremity Assessment: Defer to PT evaluation       Communication Communication Communication:  Hearing impairment Cueing Techniques: Verbal cues   Cognition Arousal: Alert Behavior During Therapy: WFL for tasks assessed/performed Overall Cognitive Status: Within Functional Limits for tasks assessed                                 General Comments: for BADL not formally assessed. Pleasant and conversational     General Comments  HR 50s- 60s    Exercises     Shoulder Instructions      Home Living Family/patient expects to be discharged to:: Private residence   Available Help at Discharge: Family Type of Home: Apartment Home Access: Level entry (Pt denied stairs, but per chart review 1 STE)     Home Layout: One level     Bathroom Shower/Tub: Chief Strategy Officer: Standard     Home Equipment: Cane - single point          Prior Functioning/Environment Prior Level of Function : Independent/Modified Independent;History of Falls (last six months)               ADLs Comments: Pt reports independent in ADL and cooking/driving. Pt reports niece has recently been staying and assisting with cleaning        OT Problem List: Decreased strength;Decreased activity tolerance;Impaired balance (sitting and/or standing)      OT Treatment/Interventions: Self-care/ADL training;Therapeutic exercise;DME and/or AE instruction;Balance training;Patient/family education;Therapeutic activities    OT Goals(Current goals can be found in the care plan section) Acute Rehab OT Goals Patient Stated Goal: get better OT Goal Formulation: With patient Time For Goal Achievement: 05/30/23 Potential to Achieve Goals: Good  OT Frequency: Min 1X/week    Co-evaluation              AM-PAC OT 6 Clicks Daily Activity     Outcome Measure Help from another person eating meals?: None Help from another person taking care of personal grooming?: A Little Help from another person toileting, which includes using toliet, bedpan, or urinal?: A Lot Help from  another person bathing (including washing, rinsing, drying)?: A Lot Help from another person to put on and taking off regular upper body clothing?: A Little Help from another person to put on and taking off regular lower body clothing?: A Lot 6 Click Score: 16   End of Session Equipment Utilized During Treatment: Oxygen Nurse Communication: Mobility status  Activity Tolerance: Patient tolerated treatment well Patient left: in chair;with call bell/phone within reach;with chair alarm set;with nursing/sitter in room  OT Visit Diagnosis: Unsteadiness on feet (R26.81);Muscle weakness (generalized) (M62.81);History of falling (Z91.81)                Time: 9052-8991 OT Time Calculation (min): 21 min Charges:  OT General Charges $OT Visit: 1 Visit OT Evaluation $OT Eval Moderate Complexity: 1 Mod  Elma JONETTA Lebron FREDERICK, OTR/L [facility] Acute Rehabilitation Office: [phone]   Elma JONETTA Lebron 05/16/2023, 12:47 PM

## 2023-05-16 NOTE — Progress Notes (Signed)
 Initial Nutrition Assessment  DOCUMENTATION CODES:   Not applicable  INTERVENTION:   Liberalize diet to Regular with continued fluid restriction  Add Renal MVI BID while on CRRT  Ensure Enlive po TID, each supplement provides 350 kcal and 20 grams of protein.  Trend phosphorus; Consider holding Phoslo  with initiation of CRRT as pt at increased risk of hypophosphatemia   NUTRITION DIAGNOSIS:   Increased nutrient needs related to acute illness as evidenced by estimated needs.  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 Not requiring vasopressors, remains on midodrine   NPO/CL x 3 days Tolerating CL diet and diet advanced this AM. Pt is hungry and looking forward to eating. Pt likes Ensure protein shakes and is agreeable to drinking, RD to order. If pt remains on CRRT beyond the next 24 hours, may need further protein supplement  Current wt 68.9 kg; admit wt 74.4 kg. EDW 68.9 kg. +edema still present, ?if current dry weight is now lower than outpatient EDW  Unable to obtain nutritional history at this time  Labs: phosphorus 3.5 (wdl), potassium 4.5 (wdl), magnesium  2.5 (H), potassium 4.5 (wdl), CBGs 82-92 Meds: miralax , midodrine , reglan ,  phoslo  with meals, calcitriol    NUTRITION - FOCUSED PHYSICAL EXAM:  Unable to assess  Diet Order:  RENAL with 1200 mL Fluid Restriction  EDUCATION NEEDS:   Not appropriate for education at this time  Skin:  Skin Assessment: Reviewed RN Assessment  Last BM:  1/12  Height:   Ht Readings from Last 1 Encounters:  05/12/23 5' (1.524 m)    Weight:   Wt Readings from Last 1 Encounters:  05/16/23 68.9 kg     BMI:  Body mass index is 29.67 kg/m.  Estimated Nutritional Needs:   Kcal:  1600-1800 kcals  Protein:  100-115 g (while on CRRT)  Fluid:  1L plus UOP    Betsey Finger MS, RDN, LDN, CNSC Registered Dietitian 3 Clinical Nutrition RD Inpatient Contact Info in [place]

## 2023-05-16 NOTE — Evaluation (Signed)
 Physical Therapy Evaluation Patient Details Name: [name] MRN: [id number] DOB: 03-23-1941 Today's Date: 05/16/2023  History of Present Illness  Patient is an 83 y/o admitted 05/12/23 due to volume overload and respiratory distress; attempted BIPAP but pt vomited and was intubated on full mechanical ventilatory support.  Pt was extubated on 05/13/23. Pt missed dialysis on Wednesday. PMH positive for ESRD on HD, HTN, anemia, endometrial cancer s/p TAH, and secondary hyperparathyroidism.  Clinical Impression  Pt admitted with above diagnosis and presents to PT with functional limitations due to deficits listed below (See PT problem list). Pt needs skilled PT to maximize independence and safety. Pt presents with significant decline in mobility from baseline where at home she mobilizes on her own. Has good family support and expect she will make good progress as she has in the past. Patient will benefit from intensive inpatient follow up therapy, >3 hours/day for hopeful eventual return home.           If plan is discharge home, recommend the following: Two people to help with walking and/or transfers;A lot of help with bathing/dressing/bathroom;Assistance with cooking/housework   Can travel by private vehicle        Equipment Recommendations Other (comment) (To be determined)  Recommendations for Other Services  Rehab consult    Functional Status Assessment Patient has had a recent decline in their functional status and demonstrates the ability to make significant improvements in function in a reasonable and predictable amount of time.     Precautions / Restrictions Precautions Precautions: Fall;Other (comment) Precaution Comments: CRRT Restrictions Weight Bearing Restrictions Per Provider Order: No      Mobility  Bed Mobility Overal bed mobility: Needs Assistance Bed Mobility: Sit to Supine       Sit to supine: Max assist   General bed mobility comments: Assist to lower  trunk and bring legs back up into bed    Transfers Overall transfer level: Needs assistance Equipment used: Ambulation equipment used Transfers: Sit to/from Stand, Bed to chair/wheelchair/BSC Sit to Stand: +2 physical assistance, Max assist           General transfer comment: Heavy assist to power up in [place] and pt unable to fully extend hips and trunk. From elevated Stedy seat pt required max assist and still unable to extend hips/trunk. Used Stedy for chair to bed. Transfer via Lift Equipment: Stedy  Ambulation/Gait                  Stairs            Wheelchair Mobility     Tilt Bed    Modified Rankin (Stroke Patients Only)       Balance Overall balance assessment: Needs assistance Sitting-balance support: Bilateral upper extremity supported, Feet supported Sitting balance-Leahy Scale: Poor Sitting balance - Comments: UE support. Likely fatigued   Standing balance support: During functional activity Standing balance-Leahy Scale: Zero Standing balance comment: +2 max with Stedy with flexed hips/trunk                             Pertinent Vitals/Pain Pain Assessment Pain Assessment: No/denies pain    Home Living Family/patient expects to be discharged to:: Private residence Living Arrangements: Other relatives (niece) Available Help at Discharge: Family Type of Home: Apartment Home Access: Level entry (Pt denied stairs, but per chart review 1 STE)       Home Layout: One level Home Equipment: Cane - single point;Rolling  Walker (2 wheels)      Prior Function Prior Level of Function : Independent/Modified Independent;History of Falls (last six months)             Mobility Comments: Pt amb without assistive device. ADLs Comments: Pt reports independent in ADL and cooking/driving. Pt reports niece has recently been staying and assisting with cleaning     Extremity/Trunk Assessment   Upper Extremity Assessment Upper Extremity  Assessment: Defer to OT evaluation    Lower Extremity Assessment Lower Extremity Assessment: Generalized weakness       Communication   Communication Communication: Hearing impairment  Cognition Arousal: Alert Behavior During Therapy: WFL for tasks assessed/performed Overall Cognitive Status: Within Functional Limits for tasks assessed                                 General Comments: for BADL not formally assessed. Pleasant and conversational        General Comments      Exercises     Assessment/Plan    PT Assessment Patient needs continued PT services  PT Problem List Decreased strength;Decreased activity tolerance;Decreased balance;Decreased mobility       PT Treatment Interventions DME instruction;Gait training;Functional mobility training;Therapeutic activities;Therapeutic exercise;Balance training;Patient/family education    PT Goals (Current goals can be found in the Care Plan section)  Acute Rehab PT Goals Patient Stated Goal: go home PT Goal Formulation: With patient Time For Goal Achievement: 05/30/23 Potential to Achieve Goals: Fair    Frequency Min 1X/week     Co-evaluation               AM-PAC PT 6 Clicks Mobility  Outcome Measure Help needed turning from your back to your side while in a flat bed without using bedrails?: A Lot Help needed moving from lying on your back to sitting on the side of a flat bed without using bedrails?: A Lot Help needed moving to and from a bed to a chair (including a wheelchair)?: Total Help needed standing up from a chair using your arms (e.g., wheelchair or bedside chair)?: Total Help needed to walk in hospital room?: Total Help needed climbing 3-5 steps with a railing? : Total 6 Click Score: 8    End of Session Equipment Utilized During Treatment: Oxygen Activity Tolerance: Patient tolerated treatment well Patient left: in bed;with call bell/phone within reach;with nursing/sitter in  room;with family/visitor present Nurse Communication: Mobility status;Need for lift equipment (Nurse assisted with transfer) PT Visit Diagnosis: Unsteadiness on feet (R26.81);Other abnormalities of gait and mobility (R26.89);Muscle weakness (generalized) (M62.81)    Time: 8356-8340 PT Time Calculation (min) (ACUTE ONLY): 16 min   Charges:   PT Evaluation $PT Eval Moderate Complexity: 1 Mod   PT General Charges $$ ACUTE PT VISIT: 1 Visit         [facility] PT Acute Rehabilitation Services Office [phone]   Rodgers ORN [facility] 05/16/2023, 6:20 PM

## 2023-05-16 NOTE — TOC Initial Note (Addendum)
 Transition of Care [facility]) - Initial/Assessment Note    Patient Details  Name: [name] MRN: [id number] Date of Birth: May 31, 1940  Transition of Care [facility]) CM/SW Contact:    Justina Delcia Czar, RN Phone Number:[phone] 05/16/2023, 8:43 AM  Clinical Narrative:    Vanessa did give call back and states pt was independent pta, driving to her appts. Will keep her updated on PT/OT recommendations. States her great-niece, Randine is HCPOA.                05/16/2023 TOC CM attempted call to pt's niece and left message for return call. Contacted pt's sister, Elveria. States pt lives in the home with niece, Vanessa. States pt does have a RW in the home but unclear of any other DME in the home. Will continue to follow for dc needs. PT evaluation and recommendations. Pt may need SNF rehab at time of dc.    05/13/2023 TOC CM spoke with pt and states her niece is he best person to call.   Expected Discharge Plan: Skilled Nursing Facility Barriers to Discharge: Continued Medical Work up   Patient Goals and CMS Choice            Expected Discharge Plan and Services   Discharge Planning Services: CM Consult   Living arrangements for the past 2 months: Apartment                                      Prior Living Arrangements/Services Living arrangements for the past 2 months: Apartment Lives with:: Relatives Patient language and need for interpreter reviewed:: Yes        Need for Family Participation in Patient Care: Yes (Comment) Care giver support system in place?: Yes (comment) Current home services: DME (Rolling Walker) Criminal Activity/Legal Involvement Pertinent to Current Situation/Hospitalization: No - Comment as needed  Activities of Daily Living   ADL Screening (condition at time of admission) Independently performs ADLs?: No (per pt's son-in-law patient has been requesting home care services for a while due to difficulty completing ADLs) Does the patient have  a NEW difficulty with bathing/dressing/toileting/self-feeding that is expected to last >3 days?: No Does the patient have a NEW difficulty with getting in/out of bed, walking, or climbing stairs that is expected to last >3 days?: No Does the patient have a NEW difficulty with communication that is expected to last >3 days?: No Is the patient deaf or have difficulty hearing?: No Does the patient have difficulty seeing, even when wearing glasses/contacts?: No Does the patient have difficulty concentrating, remembering, or making decisions?: No  Permission Sought/Granted Permission sought to share information with : Case Manager, Family Supports, PCP Permission granted to share information with : Yes, Verbal Permission Granted  Share Information with NAME: Shannin Naab     Permission granted to share info w Relationship: Niece  Permission granted to share info w Contact Information: [phone]  Emotional Assessment Appearance:: Appears stated age Attitude/Demeanor/Rapport: Lethargic   Orientation: : Oriented to Self   Psych Involvement: No (comment)  Admission diagnosis:  Shortness of breath [R06.02] Respiratory failure (HCC) [J96.90] Hypoxia [R09.02] Patient Active Problem List   Diagnosis Date Noted   Respiratory failure (HCC) 05/12/2023   Acute hypoxic respiratory failure (HCC) 03/14/2023   Multifocal pneumonia 03/14/2023   Secondary hyperparathyroidism (HCC) 03/14/2023   Hyperkalemia 06/21/2018   Volume overload 06/21/2018   High anion gap metabolic acidosis 06/21/2018  Essential hypertension 06/21/2018   ESRD on dialysis (HCC) 06/20/2018   Anemia of chronic disease 11/27/2015   AKI (acute kidney injury) (HCC) 09/13/2014   Nephritic syndrome 09/12/2014   PCP:  Doristine, [facility] Pharmacy:   [facility] [phone] [address], [place] - [address] AT [facility] OF [address] [place] [address] Phone:  [phone] Fax: [phone]  [facility] [address] [place] [address] Phone: [phone] Fax: [phone]     Social Drivers of Health (SDOH) Social History: SDOH Screenings   Food Insecurity: Patient Unable To Answer (05/12/2023)  Housing: Patient Unable To Answer (05/12/2023)  Transportation Needs: Patient Unable To Answer (05/12/2023)  Utilities: Patient Unable To Answer (05/12/2023)  Social Connections: Patient Unable To Answer (05/12/2023)  Tobacco Use: Low Risk  (05/12/2023)   SDOH Interventions: Housing Interventions: Patient Unable to Answer Utilities Interventions: Patient Unable to Answer Social Connections Interventions: Patient Unable to Answer   Readmission Risk Interventions     No data to display

## 2023-05-16 NOTE — Progress Notes (Signed)
 eLink Physician-Brief Progress Note Patient Name: [name] DOB: 1941/03/21 MRN: [id number]   Date of Service  05/16/2023  HPI/Events of Note  83 year old female with end-stage renal disease on hemodialysis who missed hemodialysis initially presented with increasing shortness of breath, noted to have bilateral multifocal pneumonia.  Having constant diarrhea  eICU Interventions  DC scheduled Reglan      Intervention Category Minor Interventions: Routine modifications to care plan (e.g. PRN medications for pain, fever)  Myanna Ziesmer 05/16/2023, 8:08 PM

## 2023-05-16 NOTE — Progress Notes (Signed)
   Inpatient Rehab Admissions Coordinator :  Per therapy recommendations patient was screened for CIR candidacy by Heron Leavell RN MSN. Patient is not yet at a level to tolerate the intensity required to pursue a CIR admit. Noted on CRRT also. The CIR admissions team will follow and monitor for progress and place a Rehab Consult order if felt to be appropriate. Please contact me with any questions.  Heron Leavell RN MSN Admissions Coordinator [phone]

## 2023-05-16 NOTE — Procedures (Signed)
 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 LUE AVF with recurrent AHRF and Vol O/L  Current CRRT Prescription: Start Date: 05/15/23 Catheter: R internal jugular Temp HD cath CCM 05/15/23, has AVF BFR: 225 Pre Blood Pump: 400 4K DFR: 1500 4K Replacement Rate: 400 4K Goal UF: current -61mL/h Anticoagulation: Fixed dose heparin  Clotting: once, kinked catheter overnight  S: -1.7L yesterday Tol CRRT well 3L [place] this AM K 4.5, P 3.5 on all 4K bath Awake alert and in usual good spirits D/w primary service and RN  O: 01/12 0701 - 01/13 0700 In: 444.2 [P.O.:240; I.V.:4.2; IV Piggyback:200.1] Out: 2221.6   Filed Weights   05/14/23 0500 05/15/23 0421 05/16/23 0500  Weight: 67.8 kg 68.1 kg 68.9 kg    Recent Labs  Lab 05/15/23 0226 05/15/23 1605 05/16/23 0320  NA 131* 133* 135  K 5.5* 4.9 4.5  CL 89* 94* 99  CO2 20* 23 23  GLUCOSE 90 82 86  BUN 63* 49* 32*  CREATININE 10.67* 7.60* 4.80*  CALCIUM  9.4 9.0 8.6*  PHOS 8.1* 5.1* 3.5   Recent Labs  Lab 05/12/23 0532 05/12/23 0921 05/14/23 0415 05/15/23 0226 05/16/23 0320  WBC 9.1   < > 6.3 7.1 5.2  NEUTROABS 6.3  --   --   --   --   HGB 6.6*   < > 9.3* 9.0* 8.2*  HCT 20.0*   < > 27.6* 27.0* 24.7*  MCV 104.2*   < > 101.8* 101.1* 102.9*  PLT 144*   < > 83* 87* 81*   < > = values in this interval not displayed.    Scheduled Meds:  calcitRIOL   1.25 mcg Oral Q M,W,F-1800   calcium  acetate  2,001 mg Oral TID WC   Chlorhexidine  Gluconate Cloth  6 each Topical Daily   feeding supplement  1 Container Oral TID BM   heparin   5,000 Units Subcutaneous Q8H   metoCLOPramide  (REGLAN ) injection  5 mg Intravenous Q8H   midodrine   10 mg Oral TID WC   polyethylene glycol  17 g Oral Daily   Continuous Infusions:   prismasol  BGK 4/2.5 400 mL/hr at 05/15/23 2233    prismasol  BGK 4/2.5 400 mL/hr at 05/15/23 2233   ampicillin -sulbactam (UNASYN ) IV Stopped (05/15/23 2225)   heparin  10,000 units/ 20 mL infusion syringe 525 Units/hr  (05/16/23 0800)   norepinephrine  (LEVOPHED ) Adult infusion Stopped (05/14/23 1030)   prismasol  BGK 4/2.5 1,500 mL/hr at 05/15/23 2233   PRN Meds:.acetaminophen , docusate sodium , haloperidol  lactate, heparin , ondansetron  (ZOFRAN ) IV, mouth rinse, polyethylene glycol  ABG    Component Value Date/Time   PHART 7.258 (L) 05/12/2023 1213   PCO2ART 64.9 (H) 05/12/2023 1213   PO2ART 104 05/12/2023 1213   HCO3 33.8 (H) 05/13/2023 0453   TCO2 35 (H) 05/13/2023 0453   ACIDBASEDEF 1.0 05/12/2023 0921   O2SAT 84.5 05/15/2023 1605   PE Awake, alert Nl WOB Diminished in bases LUE AVF +B/T RIJ Temp HD cath c/d/I S/nt Harsh 3/6 MSM at RUSB   OP HD: MWF [facility] 3.5h   350/1.5   68.9kgkg  2/2 bath  L AVF  Heparin  2200  - last OP HD 1/06, post wt 72.7kg - missed 1/08 HD yesterday - rocaltrol  1.25 mcg three times per week - no esa, last Hb 11.2  A/P  ESRD on CRRT given recurrent AHRF Using R internal jugular Temp current Has LUE AVF +B/T AHRF, pulm edema: Cont UF with CRRT for another 24h Severe AS Anemia  has been transfused; drifting down ESA today 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   Bernardino Gasman, MD [facility]

## 2023-05-16 NOTE — Plan of Care (Signed)

## 2023-05-16 NOTE — Progress Notes (Signed)
 NAME:  [name], MRN:  [id number], DOB:  1940-06-18, LOS: 4 ADMISSION DATE:  05/12/2023, CONSULTATION DATE: 05/12/2023 REFERRING MD: Emergency department physician, CHIEF COMPLAINT: Acute respiratory failure  History of Present Illness:  83 year old female who missed dialysis on Wednesday and presents with volume overload and respiratory distress.  Initially she was tried on BiPAP but vomited therefore she required intubation and full mechanical ventilatory support.  Nephrology has been contacted she will need hemodialysis as soon as possible.  She is sedated properly.  She is a pleasant with health issues that are well-documented below.  Noted her CT scan shows bilateral airspace disease she is currently on Zithromax  and Rocephin .  Pertinent  Medical History   Past Medical History:  Diagnosis Date   Anemia    Arthritis    Cancer (HCC)    urterine- surgical removal only tratment   Environmental and seasonal allergies    affects sinuses   ESRD on hemodialysis (HCC)    MWF [facility]   Heart murmur    no one has mentioned it lately- had it as a child   Hypertension    Limb cramps    leg, thigh, hands   Pneumonia    young   Shortness of breath dyspnea    With exerion     Significant Hospital Events: Including procedures, antibiotic start and stop dates in addition to other pertinent events   1/9 intubated and admitted to ICU 1/10 extubated to 4 L, having some nausea and vomiting still 1/11 not able to get dialysis due to staffing, on low-dose norepinephrine  1/12: HD catheter placed started on CRRT  Interim History / Subjective:  Resting comfortably denies distress Objective   Blood pressure (!) 124/57, pulse (!) 54, temperature 97.7 F (36.5 C), temperature source Oral, resp. rate 19, height 5' (1.524 m), weight 68.9 kg, SpO2 100%. CVP:  [5 mmHg-17 mmHg] 11 mmHg      Intake/Output Summary (Last 24 hours) at 05/16/2023 0814 Last data filed at 05/16/2023 0800 Gross per 24  hour  Intake 444.23 ml  Output 2271.7 ml  Net -1827.47 ml   Filed Weights   05/14/23 0500 05/15/23 0421 05/16/23 0500  Weight: 67.8 kg 68.1 kg 68.9 kg    Examination:  General This is a pleasant 83 year old female patient she is laying in bed currently on CRRT tolerating volume removal HEENT normal cephalic atraumatic mucous membranes are moist right IJ triple-lumen HD catheter is unremarkable.  She does have a carotid bruit audible Pulmonary clear diminished bases currently no accessory use on 4 L/min Cardiac: Bradycardic rhythm withSystolic heart murmur which is diffuse and loud and consistent with aortic stenosis.  This is also verified on echocardiogram Extremities warm dry some dependent edema the left AV fistula has good bruit and thrill Neuro awake oriented and without focal deficits  Assessment & Plan:  Acute hypoxemic resp failure due to Pulmonary edema anv volume overload complicated by probable aspiration  -extubated on 11th. breathing better w/ volume off.  Plan Cont volume removal via CRRT, will repeat a chest x-ray in the morning suspect another 24 hours of volume removal we should be in good shape Cont to wean Oxygen w/ cont pulse ox  Mobilize  IS Day 4 of Unasyn . Will get PCT. 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  Seems better plan Advance diet  Thrombocytopenia- stable in 80s Plan Monitor     Best Practice (right click and Reselect all SmartList Selections daily)   Diet/type: advance as tolerated DVT prophylaxis prophylactic heparin   Pressure ulcer(s): N/A GI prophylaxis: H2B Lines: N/A Foley:  N/A Code Status:  full code Last date of multidisciplinary goals of care discussion [if does not turn around may need GOC discussion]   My cct 32 min

## 2023-05-17 ENCOUNTER — Inpatient Hospital Stay (HOSPITAL_COMMUNITY): Payer: Medicare PPO

## 2023-05-17 DIAGNOSIS — J690 Pneumonitis due to inhalation of food and vomit: Secondary | ICD-10-CM | POA: Diagnosis not present

## 2023-05-17 DIAGNOSIS — R112 Nausea with vomiting, unspecified: Secondary | ICD-10-CM

## 2023-05-17 DIAGNOSIS — J9601 Acute respiratory failure with hypoxia: Secondary | ICD-10-CM | POA: Diagnosis not present

## 2023-05-17 DIAGNOSIS — I5031 Acute diastolic (congestive) heart failure: Secondary | ICD-10-CM | POA: Insufficient documentation

## 2023-05-17 DIAGNOSIS — I351 Nonrheumatic aortic (valve) insufficiency: Secondary | ICD-10-CM | POA: Diagnosis present

## 2023-05-17 DIAGNOSIS — I519 Heart disease, unspecified: Secondary | ICD-10-CM | POA: Insufficient documentation

## 2023-05-17 DIAGNOSIS — Y92009 Unspecified place in unspecified non-institutional (private) residence as the place of occurrence of the external cause: Secondary | ICD-10-CM

## 2023-05-17 DIAGNOSIS — I5030 Unspecified diastolic (congestive) heart failure: Secondary | ICD-10-CM | POA: Diagnosis present

## 2023-05-17 DIAGNOSIS — W19XXXA Unspecified fall, initial encounter: Secondary | ICD-10-CM

## 2023-05-17 DIAGNOSIS — M25562 Pain in left knee: Secondary | ICD-10-CM | POA: Insufficient documentation

## 2023-05-17 DIAGNOSIS — A419 Sepsis, unspecified organism: Secondary | ICD-10-CM | POA: Diagnosis not present

## 2023-05-17 DIAGNOSIS — I9589 Other hypotension: Secondary | ICD-10-CM | POA: Diagnosis present

## 2023-05-17 DIAGNOSIS — I350 Nonrheumatic aortic (valve) stenosis: Secondary | ICD-10-CM

## 2023-05-17 DIAGNOSIS — D696 Thrombocytopenia, unspecified: Secondary | ICD-10-CM | POA: Diagnosis present

## 2023-05-17 DIAGNOSIS — J811 Chronic pulmonary edema: Secondary | ICD-10-CM | POA: Insufficient documentation

## 2023-05-17 DIAGNOSIS — R5381 Other malaise: Secondary | ICD-10-CM | POA: Diagnosis present

## 2023-05-17 LAB — RENAL FUNCTION PANEL
Albumin: 3.5 g/dL (ref 3.5–5.0)
Albumin: 3.5 g/dL (ref 3.5–5.0)
Anion gap: 9 (ref 5–15)
Anion gap: 13 (ref 5–15)
BUN: 15 mg/dL (ref 8–23)
BUN: 22 mg/dL (ref 8–23)
CO2: 25 mmol/L (ref 22–32)
CO2: 23 mmol/L (ref 22–32)
Calcium: 9.1 mg/dL (ref 8.9–10.3)
Calcium: 9.4 mg/dL (ref 8.9–10.3)
Chloride: 103 mmol/L (ref 98–111)
Chloride: 102 mmol/L (ref 98–111)
Creatinine, Ser: 2.16 mg/dL — ABNORMAL HIGH (ref 0.44–1.00)
Creatinine, Ser: 3.63 mg/dL — ABNORMAL HIGH (ref 0.44–1.00)
GFR, Estimated: 22 mL/min — ABNORMAL LOW (ref >60)
GFR, Estimated: 12 mL/min — ABNORMAL LOW (ref >60)
Glucose, Bld: 108 mg/dL — ABNORMAL HIGH (ref 70–99)
Glucose, Bld: 100 mg/dL — ABNORMAL HIGH (ref 70–99)
Phosphorus: 1.6 mg/dL — ABNORMAL LOW (ref 2.5–4.6)
Phosphorus: 2.8 mg/dL (ref 2.5–4.6)
Potassium: 4.6 mmol/L (ref 3.5–5.1)
Potassium: 4.5 mmol/L (ref 3.5–5.1)
Sodium: 137 mmol/L (ref 135–145)
Sodium: 138 mmol/L (ref 135–145)

## 2023-05-17 LAB — CBC
HCT: 26.1 % — ABNORMAL LOW (ref 36.0–46.0)
Hemoglobin: 8.7 g/dL — ABNORMAL LOW (ref 12.0–15.0)
MCH: 34.4 pg — ABNORMAL HIGH (ref 26.0–34.0)
MCHC: 33.3 g/dL (ref 30.0–36.0)
MCV: 103.2 fL — ABNORMAL HIGH (ref 80.0–100.0)
Platelets: 94 10*3/uL — ABNORMAL LOW (ref 150–400)
RBC: 2.53 MIL/uL — ABNORMAL LOW (ref 3.87–5.11)
RDW: 13.5 % (ref 11.5–15.5)
WBC: 6.5 10*3/uL (ref 4.0–10.5)
nRBC: 10.2 % — ABNORMAL HIGH (ref 0.0–0.2)

## 2023-05-17 LAB — GLUCOSE, CAPILLARY
Glucose-Capillary: 105 mg/dL — ABNORMAL HIGH (ref 70–99)
Glucose-Capillary: 109 mg/dL — ABNORMAL HIGH (ref 70–99)
Glucose-Capillary: 135 mg/dL — ABNORMAL HIGH (ref 70–99)
Glucose-Capillary: 85 mg/dL (ref 70–99)
Glucose-Capillary: 93 mg/dL (ref 70–99)

## 2023-05-17 LAB — APTT: aPTT: 114 s — ABNORMAL HIGH (ref 24–36)

## 2023-05-17 LAB — PROCALCITONIN: Procalcitonin: 0.89 ng/mL

## 2023-05-17 LAB — MAGNESIUM: Magnesium: 2.6 mg/dL — ABNORMAL HIGH (ref 1.7–2.4)

## 2023-05-17 MED ORDER — POTASSIUM PHOSPHATES 15 MMOLE/5ML IV SOLN
30.0000 mmol | Freq: Once | INTRAVENOUS | Status: DC
  Filled 2023-05-17: qty 10

## 2023-05-17 MED ORDER — CALCIUM ACETATE (PHOS BINDER) 667 MG PO CAPS
2001.0000 mg | ORAL_CAPSULE | Freq: Three times a day (TID) | ORAL | Status: DC
  Administered 2023-05-18 (×2): 2001 mg via ORAL
  Filled 2023-05-17 (×4): qty 3

## 2023-05-17 MED ORDER — ORAL CARE MOUTH RINSE: 15.0000 mL | OROMUCOSAL | Status: DC | PRN

## 2023-05-17 MED ORDER — DARBEPOETIN ALFA 60 MCG/0.3ML IJ SOSY
60.0000 ug | PREFILLED_SYRINGE | INTRAMUSCULAR | Status: DC
  Administered 2023-05-17: 60 ug via SUBCUTANEOUS
  Filled 2023-05-17 (×2): qty 0.3

## 2023-05-17 MED ORDER — SODIUM CHLORIDE 0.9% FLUSH: 10.0000 mL | INTRAVENOUS | Status: DC | PRN

## 2023-05-17 NOTE — Progress Notes (Signed)
 NAME:  [name], MRN:  [id number], DOB:  24-Feb-1941, LOS: 5 ADMISSION DATE:  05/12/2023, CONSULTATION DATE: 05/12/2023 REFERRING MD: Emergency department physician, CHIEF COMPLAINT: Acute respiratory failure  History of Present Illness:  83 year old female who missed dialysis on Wednesday and presents with volume overload and respiratory distress.  Initially she was tried on BiPAP but vomited therefore she required intubation and full mechanical ventilatory support.  Nephrology has been contacted she will need hemodialysis as soon as possible.  She is sedated properly.  She is a pleasant with health issues that are well-documented below.  Noted her CT scan shows bilateral airspace disease she is currently on Zithromax  and Rocephin .  Pertinent  Medical History   Past Medical History:  Diagnosis Date   Anemia    Arthritis    Cancer (HCC)    urterine- surgical removal only tratment   Environmental and seasonal allergies    affects sinuses   ESRD on hemodialysis (HCC)    MWF [facility]   Heart murmur    no one has mentioned it lately- had it as a child   Hypertension    Limb cramps    leg, thigh, hands   Pneumonia    young   Shortness of breath dyspnea    With exerion     Significant Hospital Events: Including procedures, antibiotic start and stop dates in addition to other pertinent events   1/9 intubated and admitted to ICU 1/10 extubated to 4 L, having some nausea and vomiting still 1/11 not able to get dialysis due to staffing, on low-dose norepinephrine  1/12: HD catheter placed started on CRRT 1/13 breathing easier continues to wean oxygen stopping CRRT  Interim History / Subjective:  Reports left knee pain.  She has had this since her fall at home Objective   Blood pressure (!) 96/44, pulse (!) 53, temperature 98.5 F (36.9 C), temperature source Axillary, resp. rate 19, height 5' (1.524 m), weight 68 kg, SpO2 96%. CVP:  [9 mmHg-25 mmHg] 13 mmHg      Intake/Output  Summary (Last 24 hours) at 05/17/2023 0729 Last data filed at 05/17/2023 0700 Gross per 24 hour  Intake 1396 ml  Output 2831.5 ml  Net -1435.5 ml   Filed Weights   05/15/23 0421 05/16/23 0500 05/17/23 0354  Weight: 68.1 kg 68.9 kg 68 kg    Examination:  General This is a pleasant 83 year old female patient currently lying in bed she is in no acute distress this morning but does endorse some left knee pain which has been subacute since a fall earlier this month  HEENT normocephalic atraumatic no jugular venous distention is appreciated she has a HD catheter in her right IJ Pulmonary: Bibasilar rales diminished remains on nasal cannula Pcxr R>L effusions. Improved aeration bilaterally  Cardiac regular rhythm with marked heart murmur consistent with her known AAS Abdomen soft not tender Extremities warm dry mild dependent edema pulses are strong.  Reports left knee pain and does have some limitation in her mobility Neuro awake oriented and without focal deficits Resolved problem list   Nausea and vomiting  Assessment & Plan:  Acute hypoxemic resp failure due to Pulmonary edema anv volume overload complicated by probable aspiration  -extubated on 11th. breathing better w/ volume off. PCT normalized  Plan Volume removal as indicated w/ HD (hope we can come off iHD today) Cont to wean Oxygen w/ cont pulse ox  Needs walking oximetry prior to dc to determine O2 needs Mobilize  IS Day 5/5 of  Unasyn . 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 Suspect SIRS/sepsis from aspiration event  -off norepi currently Plan Cont tele  Cont midodrine  10 mg tid (this is a home drug BUT home dose has increased to 10mg )   Abnormal echocardiogram suspected severe calcific valvular disease rather than IE,  cultures are sent Plan F/u cultures, NGTD  ESRD on HD M/T/F Plan Awaiting renal. Can think we should transition back to iHD Am chem    Frail elderly w/ Muscular deconditioning Plan Supportive care Mobilize PT consult  Thrombocytopenia- stable in 80s Plan Monitor a little more stable   Knee pain following a fall Plan 2 view knee, physical therapy already following May need to consider MRI Best Practice (right click and Reselect all SmartList Selections daily)   Diet/type: advance as tolerated DVT prophylaxis prophylactic heparin   Pressure ulcer(s): N/A GI prophylaxis: H2B Lines: N/A Foley:  N/A Code Status:  full code Last date of multidisciplinary goals of care discussion [if does not turn around may need GOC discussion]   My cct 31 min

## 2023-05-17 NOTE — Progress Notes (Signed)
 Pt receives out-pt HD at [facility] on MWF. Will assist as needed.   Olivia Canter Renal Navigator [phone]

## 2023-05-17 NOTE — Procedures (Signed)
 Admit: 05/12/2023 LOS: 5  43F ESRD on iHD MWF LUE AVF with recurrent AHRF and Vol O/L  Current CRRT Prescription: Start Date: 05/15/23 Catheter: R internal jugular Temp HD cath CCM 05/15/23, has AVF BFR: 225 Pre Blood Pump: 400 4K DFR: 1500 4K Replacement Rate: 400 4K Goal UF: current -26mL/h Anticoagulation: Fixed dose heparin  Clotting: none  S: -1.4L yesterday Tol CRRT well 1L [place] this AM K 4.6, P 1.6 on all 4K bath Awake alert and in usual good spirits D/w primary service  O: 01/13 0701 - 01/14 0700 In: 1396 [P.O.:1196; IV Piggyback:200] Out: 2831.5   Filed Weights   05/15/23 0421 05/16/23 0500 05/17/23 0354  Weight: 68.1 kg 68.9 kg 68 kg    Recent Labs  Lab 05/16/23 0320 05/16/23 1537 05/17/23 0252  NA 135 138 137  K 4.5 4.5 4.6  CL 99 100 103  CO2 23 26 25   GLUCOSE 86 131* 108*  BUN 32* 21 15  CREATININE 4.80* 2.98* 2.16*  CALCIUM  8.6* 9.0 9.1  PHOS 3.5 1.7* 1.6*   Recent Labs  Lab 05/12/23 0532 05/12/23 0921 05/15/23 0226 05/16/23 0320 05/17/23 0252  WBC 9.1   < > 7.1 5.2 6.5  NEUTROABS 6.3  --   --   --   --   HGB 6.6*   < > 9.0* 8.2* 8.7*  HCT 20.0*   < > 27.0* 24.7* 26.1*  MCV 104.2*   < > 101.1* 102.9* 103.2*  PLT 144*   < > 87* 81* 94*   < > = values in this interval not displayed.    Scheduled Meds:  calcitRIOL   1.25 mcg Oral Q M,W,F-1800   [START ON 05/18/2023] calcium  acetate  2,001 mg Oral TID WC   Chlorhexidine  Gluconate Cloth  6 each Topical Daily   feeding supplement  237 mL Oral TID BM   heparin   5,000 Units Subcutaneous Q8H   midodrine   10 mg Oral TID WC   multivitamin  1 tablet Oral BID   polyethylene glycol  17 g Oral Daily   Continuous Infusions:   prismasol  BGK 4/2.5 400 mL/hr at 05/16/23 2332    prismasol  BGK 4/2.5 400 mL/hr at 05/16/23 2257   ampicillin -sulbactam (UNASYN ) IV Stopped (05/16/23 2233)   heparin  10,000 units/ 20 mL infusion syringe 550 Units/hr (05/17/23 0900)   norepinephrine  (LEVOPHED ) Adult infusion  Stopped (05/14/23 1030)   prismasol  BGK 4/2.5 1,500 mL/hr at 05/17/23 0755   PRN Meds:.acetaminophen , docusate sodium , haloperidol  lactate, heparin , ondansetron  (ZOFRAN ) IV, mouth rinse, mouth rinse, polyethylene glycol  ABG    Component Value Date/Time   PHART 7.258 (L) 05/12/2023 1213   PCO2ART 64.9 (H) 05/12/2023 1213   PO2ART 104 05/12/2023 1213   HCO3 33.8 (H) 05/13/2023 0453   TCO2 35 (H) 05/13/2023 0453   ACIDBASEDEF 1.0 05/12/2023 0921   O2SAT 84.5 05/15/2023 1605   PE Awake, alert Nl WOB Diminished in bases LUE AVF +B/T RIJ Temp HD cath c/d/I S/nt Harsh 3/6 MSM at [facility]   OP HD: MWF [facility] 3.5h   350/1.5   68.9kgkg  2/2 bath  L AVF  Heparin  2200  - last OP HD 1/06, post wt 72.7kg - missed 1/08 HD yesterday - rocaltrol  1.25 mcg three times per week - no esa, last Hb 11.2  A/P  ESRD on CRRT given recurrent AHRF Using R internal jugular Temp current Has LUE AVF +B/T CRRT 1/12-1/14 AHRF, pulm edema: improved now on 1L [place].  As above Severe AS Anemia  has been transfused; drifting down ESA today 1/14 CKD-BMD P low while on CRRT, CTM; on three times per week C3 Hypotension: off presors; on midodrine  10 TID, stable, has bradycardia CTM  Stop CRRT today.  Given this will not replete P as it will rise off HD.  Tentative for iHD tomorrow. If unable to tolerate consider revisit of GOC    Bernardino Gasman, MD [employer]

## 2023-05-17 NOTE — Progress Notes (Signed)
 Ready for transfer to renal floor.  Active Hospital Problems   Diagnosis Date Noted   Acute hypoxemic respiratory failure (HCC) 05/12/2023    Priority: High   Acute diastolic (congestive) heart failure (HCC) 05/17/2023    Priority: High   Pulmonary edema 05/17/2023    Priority: High   Volume overload 06/21/2018    Priority: High   Aspiration pneumonia due to gastric secretions (HCC) 05/17/2023    Priority: Medium    Aortic valvular stenosis with severe calcification 05/17/2023    Priority: Medium    Aortic regurgitation 05/17/2023    Priority: Medium    Right ventricular dysfunction 05/17/2023    Priority: Medium    ESRD on dialysis (HCC) 06/20/2018    Priority: Medium    Chronic hypotension (on midodrine ) 05/17/2023    Priority: Low   (HFpEF) heart failure with preserved ejection fraction (HCC) 05/17/2023    Priority: Low   Knee pain, left 05/17/2023    Priority: Low   Physical deconditioning 05/17/2023    Priority: Low   Thrombocytopenia (HCC) 05/17/2023    Priority: Low   Fall at home 05/17/2023    Priority: Low   Anemia of chronic disease 11/27/2015    Priority: Low   Secondary hyperparathyroidism (HCC) 03/14/2023    Resolved Hospital Problems   Diagnosis Date Noted Date Resolved   Septic shock (HCC) 05/17/2023 05/17/2023    Priority: 1.   Nausea and vomiting 05/17/2023 05/17/2023     Issues for f/u for on-coming team based on our I&P 1) need to ensure she can tolerated iHD hemodynamically 2) decide on keeping midodrine  at 10 or eventually go back to 5mg  dosing 3) determine if needs home oxygen-->will need walking oximetry prior to dc  4) Continued evaluation of mobility and progression of mobility (being followed by PT)

## 2023-05-18 DIAGNOSIS — J9601 Acute respiratory failure with hypoxia: Secondary | ICD-10-CM | POA: Diagnosis not present

## 2023-05-18 LAB — RENAL FUNCTION PANEL
Albumin: 3.5 g/dL (ref 3.5–5.0)
Anion gap: 13 (ref 5–15)
BUN: 22 mg/dL (ref 8–23)
CO2: 24 mmol/L (ref 22–32)
Calcium: 9.5 mg/dL (ref 8.9–10.3)
Chloride: 100 mmol/L (ref 98–111)
Creatinine, Ser: 4 mg/dL — ABNORMAL HIGH (ref 0.44–1.00)
GFR, Estimated: 11 mL/min — ABNORMAL LOW (ref >60)
Glucose, Bld: 94 mg/dL (ref 70–99)
Phosphorus: 3 mg/dL (ref 2.5–4.6)
Potassium: 4.4 mmol/L (ref 3.5–5.1)
Sodium: 137 mmol/L (ref 135–145)

## 2023-05-18 LAB — CULTURE, BLOOD (ROUTINE X 2)
Culture: NO GROWTH
Culture: NO GROWTH

## 2023-05-18 LAB — GLUCOSE, CAPILLARY
Glucose-Capillary: 93 mg/dL (ref 70–99)
Glucose-Capillary: 137 mg/dL — ABNORMAL HIGH (ref 70–99)
Glucose-Capillary: 104 mg/dL — ABNORMAL HIGH (ref 70–99)
Glucose-Capillary: 127 mg/dL — ABNORMAL HIGH (ref 70–99)
Glucose-Capillary: 129 mg/dL — ABNORMAL HIGH (ref 70–99)

## 2023-05-18 LAB — CBC
HCT: 27.7 % — ABNORMAL LOW (ref 36.0–46.0)
Hemoglobin: 9.1 g/dL — ABNORMAL LOW (ref 12.0–15.0)
MCH: 34.9 pg — ABNORMAL HIGH (ref 26.0–34.0)
MCHC: 32.9 g/dL (ref 30.0–36.0)
MCV: 106.1 fL — ABNORMAL HIGH (ref 80.0–100.0)
Platelets: 100 10*3/uL — ABNORMAL LOW (ref 150–400)
RBC: 2.61 MIL/uL — ABNORMAL LOW (ref 3.87–5.11)
RDW: 13.9 % (ref 11.5–15.5)
WBC: 7 10*3/uL (ref 4.0–10.5)
nRBC: 5.7 % — ABNORMAL HIGH (ref 0.0–0.2)

## 2023-05-18 LAB — APTT: aPTT: 49 s — ABNORMAL HIGH (ref 24–36)

## 2023-05-18 LAB — MAGNESIUM: Magnesium: 2.5 mg/dL — ABNORMAL HIGH (ref 1.7–2.4)

## 2023-05-18 MED ORDER — MIDODRINE HCL 5 MG PO TABS: 5.0000 mg | ORAL_TABLET | Freq: Two times a day (BID) | ORAL | Status: DC

## 2023-05-18 NOTE — Progress Notes (Signed)
 Admit: 05/12/2023 LOS: 6  70F ESRD on iHD MWF LUE AVF with recurrent AHRF and Vol O/L   Subjective:  Feels well; family visiting Seen in room Normotensive on RA, using TID midodrine  Explored GOC, we acknowledged her challenges including age, ESRD, CV disease. She wants to fight but is also realistic  01/14 0701 - 01/15 0700 In: 160 [P.O.:160] Out: 0   Filed Weights   05/16/23 0500 05/17/23 0354 05/18/23 0500  Weight: 68.9 kg 68 kg 69.6 kg    Scheduled Meds:  calcitRIOL   1.25 mcg Oral Q M,W,F-1800   calcium  acetate  2,001 mg Oral TID WC   Chlorhexidine  Gluconate Cloth  6 each Topical Daily   darbepoetin (ARANESP ) injection - DIALYSIS  60 mcg Subcutaneous Q Tue-1800   feeding supplement  237 mL Oral TID BM   heparin   5,000 Units Subcutaneous Q8H   midodrine   10 mg Oral TID WC   multivitamin  1 tablet Oral BID   polyethylene glycol  17 g Oral Daily   Continuous Infusions: PRN Meds:.acetaminophen , docusate sodium , haloperidol  lactate, ondansetron  (ZOFRAN ) IV, mouth rinse, mouth rinse, polyethylene glycol, sodium chloride  flush  Current Labs: reviewed   Physical Exam:  Blood pressure (!) 104/39, pulse 60, temperature 98 F (36.7 C), resp. rate 18, height 5' (1.524 m), weight 69.6 kg, SpO2 95%. Awake, alert Nl WOB Diminished in bases LUE AVF +B/T RIJ Temp HD cath c/d/I S/nt Harsh 3/6 MSM at [facility]  OP HD: MWF [facility] 3.5h   350/1.5   68.9kgkg  2/2 bath  L AVF  Heparin  2200  - last OP HD 1/06, post wt 72.7kg - missed 1/08 HD yesterday - rocaltrol  1.25 mcg three times per week - no esa, last Hb 11.2  A ESRD Req CRRT 1/12-1/14/25 for hypotension and AHRF, now improved HD today as census permits, use AVF Remove R internal jugular Temp current Has LUE AVF +B/T  AHRF, pulm edema: improved now on 1L [place].  As above Severe AS Anemia has been transfused; drifting down ESA qTues CKD-BMD P was low while on CRRT, CTM; on three times per week C3 now improved Hypotension: off  presors; on midodrine  10 TID, stable, has bradycardia CTM  P HD today, see how she does on midodrine  Use AVF Remove temp HD cath Medication Issues; Preferred narcotic agents for pain control are hydromorphone, fentanyl , and methadone. Morphine should not be used.  Baclofen should be avoided Avoid oral sodium phosphate and magnesium  citrate based laxatives / bowel preps    Fawn Hooks MD 05/18/2023, 11:50 AM  Recent Labs  Lab 05/17/23 0252 05/17/23 2236 05/18/23 0306  NA 137 138 137  K 4.6 4.5 4.4  CL 103 102 100  CO2 25 23 24   GLUCOSE 108* 100* 94  BUN 15 22 22   CREATININE 2.16* 3.63* 4.00*  CALCIUM  9.1 9.4 9.5  PHOS 1.6* 2.8 3.0   Recent Labs  Lab 05/12/23 0532 05/12/23 0921 05/16/23 0320 05/17/23 0252 05/18/23 0306  WBC 9.1   < > 5.2 6.5 7.0  NEUTROABS 6.3  --   --   --   --   HGB 6.6*   < > 8.2* 8.7* 9.1*  HCT 20.0*   < > 24.7* 26.1* 27.7*  MCV 104.2*   < > 102.9* 103.2* 106.1*  PLT 144*   < > 81* 94* 100*   < > = values in this interval not displayed.

## 2023-05-18 NOTE — Plan of Care (Signed)

## 2023-05-18 NOTE — Hospital Course (Signed)
 Never taken midodrine 

## 2023-05-18 NOTE — Progress Notes (Signed)
 Inpatient Rehabilitation Admissions Coordinator   I will place rehab consult for full assessment for candidacy for Cir admit.  Jeannetta Millman, RN, MSN Rehab Admissions Coordinator [phone] 05/18/2023 5:30 PM

## 2023-05-18 NOTE — Progress Notes (Signed)
 Progress Note   Patient: [name] GNF:[id number] DOB: [birth year] DOA: 05/12/2023     6 DOS: the patient was seen and examined on 05/18/2023   Brief hospital course: 83 year old female with end-stage renal disease on hemodialysis who missed hemodialysis session presented with increasing shortness of breath, noted to have bilateral multifocal pneumonia and acute pulmonary edema due to volume overload   CRRT stopped 1/14 AM   Assessment and Plan: Acute respiratory failure with hypoxia and hypercapnia  HFpEF exacerbation Multifocal pneumonia  Sepsis with septic shock POA Resolved   Patient presented with hypoxemia requiring intubation. She was also noted to be hypotensive. It was felt patient's presentation due to volume overload in the setting of missed HD as well as possible aspiration pneumonia.Aaron Aas She was treated with unasyn  and volume was removed with CRRT. Her blood cultures have remained negative and patient's breathing significantly improved, now on room air.  -Continue to remove fluid with HD -Wean midodrine  (patient reports she is not on the medication at home, it is listed as a home medication)  ESRD on HD Continue per nephrology   Thrombocytopenia  Likely related to infection. Improved this AM. Continue to monitor     Latest Ref Rng & Units 05/18/2023    3:06 AM 05/17/2023    2:52 AM 05/16/2023    3:20 AM  CBC  WBC 4.0 - 10.5 K/uL 7.0  6.5  5.2   Hemoglobin 12.0 - 15.0 g/dL 9.1  8.7  8.2   Hematocrit 36.0 - 46.0 % 27.7  26.1  24.7   Platelets 150 - 400 K/uL 100  94  81     Macrocytic anemia Anemia of chronic kidney disease     Latest Ref Rng & Units 05/18/2023    3:06 AM 05/17/2023    2:52 AM 05/16/2023    3:20 AM  CBC  WBC 4.0 - 10.5 K/uL 7.0  6.5  5.2   Hemoglobin 12.0 - 15.0 g/dL 9.1  8.7  8.2   Hematocrit 36.0 - 46.0 % 27.7  26.1  24.7   Platelets 150 - 400 K/uL 100  94  81   2 point drop from hgb 2 months prior to this hospitalization. No obvious bleeding  noted. Possibly due to infection. Stable.  Check vitb12 folate, iron panel  ESA per nephrology   Frail/deconditioned PT/OT      Subjective: Denies chest pain or dyspnea, Feels her lower extremity swelling is improving.   Physical Exam: Vitals:   05/18/23 0255 05/18/23 0500 05/18/23 0730 05/18/23 1524  BP: (!) 134/41  (!) 104/39 134/75  Pulse: (!) 59  60 65  Resp:   18 18  Temp: 98.1 F (36.7 C)  98 F (36.7 C) 98.3 F (36.8 C)  TempSrc: Oral   Oral  SpO2: 94%  95% 91%  Weight:  69.6 kg    Height:       Physical Exam  Constitutional: In no distress.  Cardiovascular: Normal rate, regular rhythm. Trace bilateral lower extremity edema  Pulmonary: Non labored breathing on room air, no wheezing or rales.  Abdominal: Soft. Normal bowel sounds. Non distended and non tender Musculoskeletal: Normal range of motion.     Neurological: Alert and oriented to person, place, and time. Non focal  Skin: Skin is warm and dry.   Data Reviewed:  I have reviewed all labs and images.   Family Communication: Niece and daughter in room   Disposition: Status is: Inpatient Remains inpatient appropriate because: recently transitioned off  CRRT   Planned Discharge Destination:  Pending clinical course  Possibly CIR     Time spent: 35 minutes  Author: Joette Mustard, MD 05/18/2023 5:11 PM  For on call review [email or web address].

## 2023-05-18 NOTE — Progress Notes (Signed)
 Physical Therapy Treatment Patient Details Name: [name] MRN: [id number] DOB: 15-Apr-1941 Today's Date: 05/18/2023   History of Present Illness Patient is an 83 y/o admitted 05/12/23 due to volume overload and respiratory distress; attempted BIPAP but pt vomited and was intubated on full mechanical ventilatory support.  Pt was extubated on 05/13/23. Pt on CRRT until 05/15/23.  PMH positive for ESRD on HD, HTN, anemia, endometrial cancer s/p TAH, and secondary hyperparathyroidism.    PT Comments  Pt making excellent progress with mobility and activity tolerance. Able to amb short distance in hallway with assist. Patient will benefit from intensive inpatient follow up therapy, >3 hours/day.      If plan is discharge home, recommend the following: Assistance with cooking/housework;A little help with walking and/or transfers;A little help with bathing/dressing/bathroom;Assist for transportation;Help with stairs or ramp for entrance   Can travel by private vehicle        Equipment Recommendations  None recommended by PT    Recommendations for Other Services Rehab consult     Precautions / Restrictions Precautions Precautions: Fall;Other (comment) Precaution Comments: CRRT Restrictions Weight Bearing Restrictions Per Provider Order: No     Mobility  Bed Mobility Overal bed mobility: Needs Assistance Bed Mobility: Supine to Sit     Supine to sit: Contact guard, HOB elevated, Used rails     General bed mobility comments: Incr time and effort    Transfers Overall transfer level: Needs assistance Equipment used: Rolling walker (2 wheels) Transfers: Sit to/from Stand Sit to Stand: Min assist, +2 safety/equipment           General transfer comment: Assist to power up and stabilize    Ambulation/Gait Ambulation/Gait assistance: Min assist, +2 safety/equipment Gait Distance (Feet): 40 Feet Assistive device: Rolling walker (2 wheels) Gait Pattern/deviations:  Step-through pattern, Decreased step length - right, Decreased step length - left, Trunk flexed Gait velocity: decr Gait velocity interpretation: <1.8 ft/sec, indicate of risk for recurrent falls   General Gait Details: Assist for balance and support.   Stairs             Wheelchair Mobility     Tilt Bed    Modified Rankin (Stroke Patients Only)       Balance Overall balance assessment: Needs assistance Sitting-balance support: Feet supported, No upper extremity supported Sitting balance-Leahy Scale: Fair     Standing balance support: During functional activity, Bilateral upper extremity supported Standing balance-Leahy Scale: Poor Standing balance comment: walker and min assist for static standing                            Cognition Arousal: Alert Behavior During Therapy: WFL for tasks assessed/performed Overall Cognitive Status: Within Functional Limits for tasks assessed                                          Exercises      General Comments General comments (skin integrity, edema, etc.): VSS      Pertinent Vitals/Pain Pain Assessment Pain Assessment: No/denies pain    Home Living                          Prior Function            PT Goals (current goals can now be found in the care plan section)  Acute Rehab PT Goals Patient Stated Goal: go home PT Goal Formulation: With patient Time For Goal Achievement: 05/30/23 Potential to Achieve Goals: Fair Progress towards PT goals: Progressing toward goals;Goals updated    Frequency    Min 1X/week      PT Plan      Co-evaluation              AM-PAC PT "6 Clicks" Mobility   Outcome Measure  Help needed turning from your back to your side while in a flat bed without using bedrails?: A Little Help needed moving from lying on your back to sitting on the side of a flat bed without using bedrails?: A Little Help needed moving to and from a bed to a  chair (including a wheelchair)?: A Little Help needed standing up from a chair using your arms (e.g., wheelchair or bedside chair)?: A Little Help needed to walk in hospital room?: A Little Help needed climbing 3-5 steps with a railing? : Total 6 Click Score: 16    End of Session Equipment Utilized During Treatment: Gait belt Activity Tolerance: Patient tolerated treatment well Patient left: with call bell/phone within reach;in chair;with chair alarm set;with family/visitor present Nurse Communication: Mobility status PT Visit Diagnosis: Unsteadiness on feet (R26.81);Other abnormalities of gait and mobility (R26.89);Muscle weakness (generalized) (M62.81)     Time: 1610-9604 PT Time Calculation (min) (ACUTE ONLY): 37 min  Charges:    $Gait Training: 23-37 mins PT General Charges $$ ACUTE PT VISIT: 1 Visit                     [facility] PT Acute Rehabilitation Services Office [phone]    Pura Browns [facility] 05/18/2023, 11:30 AM

## 2023-05-18 NOTE — Care Management Important Message (Signed)
 Important Message  Patient Details  Name: [name] MRN: [id number] Date of Birth: 11-01-1940   Important Message Given:  Yes - Medicare IM     Wynonia Hedges 05/18/2023, 3:41 PM

## 2023-05-18 NOTE — Progress Notes (Signed)
 Per Diplomatic Services operational officer, patient called out for assistance because she was having "shortness of breath." Upon entering room, patient appeared to be anxious with labored breathing. Patient's nasal canula not in place at this time. Per patient, she removed oxygen "when I started to eat and drink." Patient's O2 saturation reading 87 on room air. Nurse applied oxygen via nasal canula to patient and assisted patient with breathing exercises. Per patient, she is feeling better. Oxygen saturation 100% on 3L O2. Patient advised to leave oxygen in place-confirmed understanding. Oncoming nurse made aware and night provider paged-awaiting callback. Patient bed lowered with call bell in place and bed alarm initiated.

## 2023-05-18 NOTE — Care Management Important Message (Addendum)
 Important Message  Patient Details  Name: [name] MRN: [id number] Date of Birth: [birth year]   Important Message Given:  Yes - Medicare IM  Spoke with Patient relative Natasha Bailiff who ask that I send a copy of the IM to her home address.    Anye Brose 05/18/2023, 1:19 PM

## 2023-05-19 ENCOUNTER — Inpatient Hospital Stay (HOSPITAL_COMMUNITY): Payer: Medicare PPO

## 2023-05-19 DIAGNOSIS — I469 Cardiac arrest, cause unspecified: Secondary | ICD-10-CM

## 2023-05-19 DIAGNOSIS — N186 End stage renal disease: Secondary | ICD-10-CM | POA: Diagnosis not present

## 2023-05-19 DIAGNOSIS — J9601 Acute respiratory failure with hypoxia: Secondary | ICD-10-CM | POA: Diagnosis not present

## 2023-05-19 LAB — VITAMIN B12: Vitamin B-12: 5560 pg/mL — ABNORMAL HIGH (ref 180–914)

## 2023-05-19 LAB — PROTIME-INR
INR: 1.3 — ABNORMAL HIGH (ref 0.8–1.2)
Prothrombin Time: 16.3 s — ABNORMAL HIGH (ref 11.4–15.2)

## 2023-05-19 LAB — IRON AND TIBC
Iron: 158 ug/dL (ref 28–170)
Saturation Ratios: 78 % — ABNORMAL HIGH (ref 10.4–31.8)
TIBC: 202 ug/dL — ABNORMAL LOW (ref 250–450)
UIBC: 44 ug/dL

## 2023-05-19 LAB — COMPREHENSIVE METABOLIC PANEL
ALT: 387 U/L — ABNORMAL HIGH (ref 0–44)
ALT: 313 U/L — ABNORMAL HIGH (ref 0–44)
AST: 223 U/L — ABNORMAL HIGH (ref 15–41)
AST: 156 U/L — ABNORMAL HIGH (ref 15–41)
Albumin: 3.3 g/dL — ABNORMAL LOW (ref 3.5–5.0)
Albumin: 3.5 g/dL (ref 3.5–5.0)
Alkaline Phosphatase: 91 U/L (ref 38–126)
Alkaline Phosphatase: 73 U/L (ref 38–126)
Anion gap: 21 — ABNORMAL HIGH (ref 5–15)
Anion gap: 17 — ABNORMAL HIGH (ref 5–15)
BUN: 40 mg/dL — ABNORMAL HIGH (ref 8–23)
BUN: 49 mg/dL — ABNORMAL HIGH (ref 8–23)
CO2: 19 mmol/L — ABNORMAL LOW (ref 22–32)
CO2: 23 mmol/L (ref 22–32)
Calcium: 9.6 mg/dL (ref 8.9–10.3)
Calcium: 9.6 mg/dL (ref 8.9–10.3)
Chloride: 98 mmol/L (ref 98–111)
Chloride: 97 mmol/L — ABNORMAL LOW (ref 98–111)
Creatinine, Ser: 6.52 mg/dL — ABNORMAL HIGH (ref 0.44–1.00)
Creatinine, Ser: 7.37 mg/dL — ABNORMAL HIGH (ref 0.44–1.00)
GFR, Estimated: 6 mL/min — ABNORMAL LOW (ref >60)
GFR, Estimated: 5 mL/min — ABNORMAL LOW (ref >60)
Glucose, Bld: 255 mg/dL — ABNORMAL HIGH (ref 70–99)
Glucose, Bld: 116 mg/dL — ABNORMAL HIGH (ref 70–99)
Potassium: 4.2 mmol/L (ref 3.5–5.1)
Potassium: 5.6 mmol/L — ABNORMAL HIGH (ref 3.5–5.1)
Sodium: 138 mmol/L (ref 135–145)
Sodium: 137 mmol/L (ref 135–145)
Total Bilirubin: 1.5 mg/dL — ABNORMAL HIGH (ref 0.0–1.2)
Total Bilirubin: 2.1 mg/dL — ABNORMAL HIGH (ref 0.0–1.2)
Total Protein: 6.6 g/dL (ref 6.5–8.1)
Total Protein: 6.7 g/dL (ref 6.5–8.1)

## 2023-05-19 LAB — MRSA NEXT GEN BY PCR, NASAL: MRSA by PCR Next Gen: NOT DETECTED

## 2023-05-19 LAB — CBC WITH DIFFERENTIAL/PLATELET
Abs Immature Granulocytes: 0 10*3/uL (ref 0.00–0.07)
Basophils Absolute: 0 10*3/uL (ref 0.0–0.1)
Basophils Relative: 0 %
Eosinophils Absolute: 0.3 10*3/uL (ref 0.0–0.5)
Eosinophils Relative: 2 %
HCT: 31.2 % — ABNORMAL LOW (ref 36.0–46.0)
Hemoglobin: 9.6 g/dL — ABNORMAL LOW (ref 12.0–15.0)
Lymphocytes Relative: 27 %
Lymphs Abs: 3.4 10*3/uL (ref 0.7–4.0)
MCH: 34 pg (ref 26.0–34.0)
MCHC: 30.8 g/dL (ref 30.0–36.0)
MCV: 110.6 fL — ABNORMAL HIGH (ref 80.0–100.0)
Monocytes Absolute: 0.8 10*3/uL (ref 0.1–1.0)
Monocytes Relative: 6 %
Neutro Abs: 8.2 10*3/uL — ABNORMAL HIGH (ref 1.7–7.7)
Neutrophils Relative %: 65 %
Platelets: 110 10*3/uL — ABNORMAL LOW (ref 150–400)
RBC: 2.82 MIL/uL — ABNORMAL LOW (ref 3.87–5.11)
RDW: 14.4 % (ref 11.5–15.5)
WBC: 12.6 10*3/uL — ABNORMAL HIGH (ref 4.0–10.5)
nRBC: 15 /100{WBCs} — ABNORMAL HIGH
nRBC: 9.7 % — ABNORMAL HIGH (ref 0.0–0.2)

## 2023-05-19 LAB — MAGNESIUM
Magnesium: 2.9 mg/dL — ABNORMAL HIGH (ref 1.7–2.4)
Magnesium: 2.7 mg/dL — ABNORMAL HIGH (ref 1.7–2.4)

## 2023-05-19 LAB — POCT I-STAT 7, (LYTES, BLD GAS, ICA,H+H)
Acid-Base Excess: 1 mmol/L (ref 0.0–2.0)
Bicarbonate: 26.5 mmol/L (ref 20.0–28.0)
Calcium, Ion: 1.24 mmol/L (ref 1.15–1.40)
HCT: 30 % — ABNORMAL LOW (ref 36.0–46.0)
Hemoglobin: 10.2 g/dL — ABNORMAL LOW (ref 12.0–15.0)
O2 Saturation: 100 %
Patient temperature: 97.1
Potassium: 4.3 mmol/L (ref 3.5–5.1)
Sodium: 138 mmol/L (ref 135–145)
TCO2: 28 mmol/L (ref 22–32)
pCO2 arterial: 45.6 mm[Hg] (ref 32–48)
pH, Arterial: 7.368 (ref 7.35–7.45)
pO2, Arterial: 397 mm[Hg] — ABNORMAL HIGH (ref 83–108)

## 2023-05-19 LAB — GLUCOSE, CAPILLARY
Glucose-Capillary: 101 mg/dL — ABNORMAL HIGH (ref 70–99)
Glucose-Capillary: 109 mg/dL — ABNORMAL HIGH (ref 70–99)
Glucose-Capillary: 115 mg/dL — ABNORMAL HIGH (ref 70–99)
Glucose-Capillary: 120 mg/dL — ABNORMAL HIGH (ref 70–99)
Glucose-Capillary: 125 mg/dL — ABNORMAL HIGH (ref 70–99)
Glucose-Capillary: 140 mg/dL — ABNORMAL HIGH (ref 70–99)
Glucose-Capillary: 163 mg/dL — ABNORMAL HIGH (ref 70–99)
Glucose-Capillary: 167 mg/dL — ABNORMAL HIGH (ref 70–99)
Glucose-Capillary: 174 mg/dL — ABNORMAL HIGH (ref 70–99)
Glucose-Capillary: 222 mg/dL — ABNORMAL HIGH (ref 70–99)
Glucose-Capillary: 248 mg/dL — ABNORMAL HIGH (ref 70–99)
Glucose-Capillary: 44 mg/dL — CL (ref 70–99)
Glucose-Capillary: 45 mg/dL — ABNORMAL LOW (ref 70–99)
Glucose-Capillary: 82 mg/dL (ref 70–99)

## 2023-05-19 LAB — LACTIC ACID, PLASMA
Lactic Acid, Venous: 8.9 mmol/L (ref 0.5–1.9)
Lactic Acid, Venous: 2.4 mmol/L (ref 0.5–1.9)

## 2023-05-19 LAB — TROPONIN I (HIGH SENSITIVITY): Troponin I (High Sensitivity): 2866 ng/L (ref <18)

## 2023-05-19 LAB — HEMOGLOBIN A1C
Hgb A1c MFr Bld: 4.9 % (ref 4.8–5.6)
Mean Plasma Glucose: 93.93 mg/dL

## 2023-05-19 LAB — PHOSPHORUS: Phosphorus: 4.8 mg/dL — ABNORMAL HIGH (ref 2.5–4.6)

## 2023-05-19 LAB — FOLATE: Folate: 20.7 ng/mL (ref >5.9)

## 2023-05-19 MED ORDER — NOREPINEPHRINE 4 MG/250ML-% IV SOLN
0.0000 ug/min | INTRAVENOUS | Status: DC
  Administered 2023-05-19: 5 ug/min via INTRAVENOUS
  Administered 2023-05-21: 7 ug/min via INTRAVENOUS
  Administered 2023-05-21 (×2): 8 ug/min via INTRAVENOUS
  Filled 2023-05-19 (×4): qty 250

## 2023-05-19 MED ORDER — NOREPINEPHRINE 4 MG/250ML-% IV SOLN
INTRAVENOUS | Status: AC
  Filled 2023-05-19: qty 250

## 2023-05-19 MED ORDER — DOCUSATE SODIUM 50 MG/5ML PO LIQD: 100.0000 mg | Freq: Two times a day (BID) | ORAL | Status: DC | PRN

## 2023-05-19 MED ORDER — CALCIUM CARBONATE 1250 (500 CA) MG PO TABS
500.0000 mg | ORAL_TABLET | Freq: Three times a day (TID) | ORAL | Status: DC
  Administered 2023-05-19 – 2023-05-21 (×7): 1250 mg
  Filled 2023-05-19 (×9): qty 1

## 2023-05-19 MED ORDER — FAMOTIDINE 20 MG PO TABS: 20.0000 mg | ORAL_TABLET | Freq: Two times a day (BID) | ORAL | Status: DC

## 2023-05-19 MED ORDER — FAMOTIDINE 20 MG PO TABS
20.0000 mg | ORAL_TABLET | Freq: Two times a day (BID) | ORAL | Status: DC
  Administered 2023-05-19: 20 mg
  Filled 2023-05-19: qty 1

## 2023-05-19 MED ORDER — PROPOFOL 1000 MG/100ML IV EMUL
5.0000 ug/kg/min | INTRAVENOUS | Status: DC
  Administered 2023-05-19: 30 ug/kg/min via INTRAVENOUS
  Administered 2023-05-20: 20 ug/kg/min via INTRAVENOUS
  Administered 2023-05-21: 15 ug/kg/min via INTRAVENOUS
  Administered 2023-05-21: 25 ug/kg/min via INTRAVENOUS
  Filled 2023-05-19 (×5): qty 100

## 2023-05-19 MED ORDER — NOREPINEPHRINE 4 MG/250ML-% IV SOLN
INTRAVENOUS | Status: AC
  Administered 2023-05-19: 2 ug/min via INTRAVENOUS
  Filled 2023-05-19: qty 250

## 2023-05-19 MED ORDER — FENTANYL CITRATE PF 50 MCG/ML IJ SOSY
25.0000 ug | PREFILLED_SYRINGE | INTRAMUSCULAR | Status: DC | PRN
Start: 2023-05-19 — End: 2023-05-22
  Administered 2023-05-19: 75 ug via INTRAVENOUS
  Administered 2023-05-19 – 2023-05-20 (×13): 50 ug via INTRAVENOUS
  Administered 2023-05-21 (×3): 100 ug via INTRAVENOUS
  Administered 2023-05-21: 50 ug via INTRAVENOUS
  Administered 2023-05-21 (×2): 100 ug via INTRAVENOUS
  Administered 2023-05-21 (×4): 50 ug via INTRAVENOUS
  Filled 2023-05-19 (×2): qty 1
  Filled 2023-05-19: qty 2
  Filled 2023-05-19: qty 1
  Filled 2023-05-19: qty 2
  Filled 2023-05-19 (×4): qty 1
  Filled 2023-05-19: qty 2
  Filled 2023-05-19: qty 1
  Filled 2023-05-19: qty 2
  Filled 2023-05-19 (×2): qty 1
  Filled 2023-05-19: qty 2
  Filled 2023-05-19 (×2): qty 1
  Filled 2023-05-19: qty 2
  Filled 2023-05-19: qty 1
  Filled 2023-05-19: qty 2
  Filled 2023-05-19 (×3): qty 1

## 2023-05-19 MED ORDER — ACETAMINOPHEN 325 MG PO TABS: 650.0000 mg | ORAL_TABLET | ORAL | Status: DC | PRN

## 2023-05-19 MED ORDER — FENTANYL CITRATE PF 50 MCG/ML IJ SOSY
25.0000 ug | PREFILLED_SYRINGE | INTRAMUSCULAR | Status: DC | PRN
Start: 2023-05-19 — End: 2023-05-22
  Filled 2023-05-19: qty 1

## 2023-05-19 MED ORDER — MIDODRINE HCL 5 MG PO TABS
10.0000 mg | ORAL_TABLET | Freq: Two times a day (BID) | ORAL | Status: DC
Start: 2023-05-20 — End: 2023-05-22
  Administered 2023-05-20 – 2023-05-21 (×3): 10 mg
  Filled 2023-05-19 (×3): qty 2

## 2023-05-19 MED ORDER — PHENYLEPHRINE HCL-NACL 20-0.9 MG/250ML-% IV SOLN
25.0000 ug/min | INTRAVENOUS | Status: DC
  Administered 2023-05-19: 70 ug/min via INTRAVENOUS
  Filled 2023-05-19: qty 250

## 2023-05-19 MED ORDER — SODIUM CHLORIDE 0.9 % IV SOLN: 250.0000 mL | INTRAVENOUS | Status: AC

## 2023-05-19 MED ORDER — DEXTROSE 50 % IV SOLN
50.0000 mL | INTRAVENOUS | Status: DC | PRN
  Administered 2023-05-19: 25 mL via INTRAVENOUS
  Filled 2023-05-19: qty 50

## 2023-05-19 MED ORDER — FAMOTIDINE 20 MG PO TABS
20.0000 mg | ORAL_TABLET | Freq: Every day | ORAL | Status: DC
Start: 2023-05-20 — End: 2023-05-22
  Administered 2023-05-20 – 2023-05-21 (×2): 20 mg
  Filled 2023-05-19 (×2): qty 1

## 2023-05-19 MED ORDER — CALCITRIOL 0.25 MCG PO CAPS
1.2500 ug | ORAL_CAPSULE | ORAL | Status: DC
  Administered 2023-05-20: 1.25 ug
  Filled 2023-05-19: qty 5

## 2023-05-19 MED ORDER — MIDODRINE HCL 5 MG PO TABS
5.0000 mg | ORAL_TABLET | Freq: Two times a day (BID) | ORAL | Status: DC
Start: 2023-05-19 — End: 2023-05-19
  Administered 2023-05-19 (×2): 5 mg
  Filled 2023-05-19 (×2): qty 1

## 2023-05-19 MED ORDER — POLYETHYLENE GLYCOL 3350 17 G PO PACK
17.0000 g | PACK | Freq: Every day | ORAL | Status: DC
  Administered 2023-05-19: 17 g
  Filled 2023-05-19: qty 1

## 2023-05-19 MED ORDER — DEXTROSE 50 % IV SOLN
25.0000 g | INTRAVENOUS | Status: AC
  Administered 2023-05-19: 25 g via INTRAVENOUS
  Filled 2023-05-19: qty 50

## 2023-05-19 MED ORDER — PROPOFOL 1000 MG/100ML IV EMUL
INTRAVENOUS | Status: AC
  Administered 2023-05-19: 5 ug/kg/min via INTRAVENOUS
  Filled 2023-05-19: qty 100

## 2023-05-19 MED ORDER — PHENYLEPHRINE HCL-NACL 20-0.9 MG/250ML-% IV SOLN
INTRAVENOUS | Status: AC
  Administered 2023-05-19: 25 ug/min via INTRAVENOUS
  Filled 2023-05-19: qty 250

## 2023-05-19 MED ORDER — INSULIN ASPART 100 UNIT/ML IJ SOLN
0.0000 [IU] | INTRAMUSCULAR | Status: DC
  Administered 2023-05-19: 2 [IU] via SUBCUTANEOUS

## 2023-05-19 MED ORDER — SODIUM ZIRCONIUM CYCLOSILICATE 10 G PO PACK
10.0000 g | PACK | Freq: Once | ORAL | Status: AC
  Administered 2023-05-19: 10 g
  Filled 2023-05-19: qty 1

## 2023-05-19 MED ORDER — ASPIRIN 81 MG PO CHEW
81.0000 mg | CHEWABLE_TABLET | Freq: Every day | ORAL | Status: DC
  Administered 2023-05-19 – 2023-05-21 (×3): 81 mg
  Filled 2023-05-19 (×3): qty 1

## 2023-05-19 MED ORDER — DOCUSATE SODIUM 50 MG/5ML PO LIQD
100.0000 mg | Freq: Two times a day (BID) | ORAL | Status: DC
  Administered 2023-05-19 – 2023-05-21 (×4): 100 mg
  Filled 2023-05-19 (×5): qty 10

## 2023-05-19 MED ORDER — RENA-VITE PO TABS
1.0000 | ORAL_TABLET | Freq: Two times a day (BID) | ORAL | Status: DC
Start: 2023-05-19 — End: 2023-05-22
  Administered 2023-05-19 – 2023-05-21 (×5): 1
  Filled 2023-05-19 (×5): qty 1

## 2023-05-19 MED ORDER — NOREPINEPHRINE 4 MG/250ML-% IV SOLN: 2.0000 ug/min | INTRAVENOUS | Status: DC

## 2023-05-19 MED ORDER — POLYETHYLENE GLYCOL 3350 17 G PO PACK: 17.0000 g | PACK | Freq: Every day | ORAL | Status: DC | PRN

## 2023-05-19 NOTE — Progress Notes (Signed)
Admit: 05/12/2023 LOS: 7  63F ESRD on iHD MWF LUE AVF with recurrent AHRF and Vol O/L   Subjective:  PEA arrest overnight, until ROSC Now VDRF on pressors K 4.2, HCO3 19 ABG 7.368 / 45 / 397 She did not receive HD yesterday Temp HD cath was removed  01/15 0701 - 01/16 0700 In: 218.8 [P.O.:120; I.V.:98.8] Out: -   Filed Weights   05/17/23 0354 05/18/23 0500 05/19/23 0423  Weight: 68 kg 69.6 kg 69.4 kg    Scheduled Meds:  [START ON 05/20/2023] calcitRIOL  1.25 mcg Per Tube Q M,W,F-1800   calcium carbonate  500 mg of elemental calcium Per Tube TID WC   Chlorhexidine Gluconate Cloth  6 each Topical Daily   darbepoetin (ARANESP) injection - DIALYSIS  60 mcg Subcutaneous Q Tue-1800   docusate  100 mg Per Tube BID   famotidine  20 mg Per Tube BID   heparin  5,000 Units Subcutaneous Q8H   insulin aspart  0-9 Units Subcutaneous Q4H   midodrine  5 mg Per Tube BID WC   multivitamin  1 tablet Per Tube BID   polyethylene glycol  17 g Per Tube Daily   Continuous Infusions:  sodium chloride     sodium chloride     norepinephrine     phenylephrine (NEO-SYNEPHRINE) Adult infusion 70 mcg/min (05/19/23 0800)   propofol (DIPRIVAN) infusion 10 mcg/kg/min (05/19/23 0800)   PRN Meds:.acetaminophen, docusate, fentaNYL (SUBLIMAZE) injection, fentaNYL (SUBLIMAZE) injection, norepinephrine, ondansetron (ZOFRAN) IV, mouth rinse, mouth rinse, polyethylene glycol, sodium chloride flush  Current Labs: reviewed   Physical Exam:  Blood pressure (!) 117/39, pulse (!) 56, temperature (!) 97.4 F (36.3 C), temperature source Oral, resp. rate 18, height 5' (1.524 m), weight 69.4 kg, SpO2 97%. 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  She wanted to press on but was realistic taht she was sick. Given events overnight and previous discussion I do not think she would want ongoing RRT nor do I think she will receive benefit from HD at this time I recommend GOC and transiton to comfort measures Attempted to call daughter w/o any anser D/w primary RN and Dr. Merrily Pew Medication Issues; Preferred narcotic agents for pain control are hydromorphone, fentanyl, and methadone. Morphine should not be used.  Baclofen should be avoided Avoid oral sodium phosphate and magnesium citrate based laxatives / bowel preps    Sabra Heck MD 05/19/2023, 9:15 AM  Recent Labs  Lab 05/17/23 0252 05/17/23 2236 05/18/23 0306 05/19/23 0348 05/19/23 0541  NA 137 138 137 138 138  K 4.6 4.5 4.4 4.2 4.3  CL 103  102 100 98  --   CO2 25 23 24  19*  --   GLUCOSE 108* 100* 94 255*  --   BUN 15 22 22  40*  --   CREATININE 2.16* 3.63* 4.00* 6.52*  --   CALCIUM 9.1 9.4 9.5 9.6  --   PHOS 1.6* 2.8 3.0  --   --    Recent Labs  Lab 05/17/23 0252 05/18/23 0306 05/19/23 0348 05/19/23 0541  WBC 6.5 7.0 12.6*  --   NEUTROABS  --   --  8.2*  --   HGB 8.7* 9.1* 9.6* 10.2*  HCT 26.1* 27.7* 31.2* 30.0*  MCV 103.2* 106.1* 110.6*  --   PLT 94* 100* 110*  --

## 2023-05-19 NOTE — Code Documentation (Signed)
  Patient Name: [name]   MRN: [id number]   Date of Birth/ Sex: 28-Jan-1941 , adult      Admission Date: 05/12/2023  Attending Provider: Marolyn Haller, MD  Primary Diagnosis: Acute hypoxemic respiratory failure [facility])   Indication: Pt was in her usual state of health until this 3:12 AM, when she was noted to be unresponsive and asystole . Code blue was subsequently called. At the time of arrival on scene, ACLS protocol was underway.   Technical Description:  - CPR performance duration:  20 minute  - Was defibrillation or cardioversion used? No   - Was external pacer placed? Yes  - Was patient intubated pre/post CPR? Yes   Medications Administered: Y = Yes; Blank = No Amiodarone    Atropine    Calcium    Epinephrine  Y  Lidocaine    Magnesium    Norepinephrine    Phenylephrine    Sodium bicarbonate  Y  Vasopressin     Post CPR evaluation:  - Final Status - Was patient successfully resuscitated ? Yes - What is current rhythm? Afib with RVR  - What is current hemodynamic status? Stable but critical   Miscellaneous Information:  - Labs sent, including: CBC,CMP, Mg, Lactic Acid, Troponin  - Primary team notified?  Yes  - Family Notified? Yes  - Additional notes/ transfer status: Transfer to Cardiac ICU     Kathleen Lime, MD  05/19/2023, 3:45 AM

## 2023-05-19 NOTE — Progress Notes (Signed)
IV Team responded to Code Blue, Intraosseous and PIV x 2 placed.

## 2023-05-19 NOTE — Progress Notes (Signed)
NAME:  [name], MRN:  [id number], DOB:  [birth year], LOS: 7 ADMISSION DATE:  05/12/2023, CONSULTATION DATE: 05/12/2023 REFERRING MD: Emergency department physician, CHIEF COMPLAINT: Acute respiratory failure  History of Present Illness:  83 year old female who missed dialysis on Wednesday and presents with volume overload and respiratory distress.  Initially she was tried on BiPAP but vomited therefore she required intubation and full mechanical ventilatory support.  Nephrology has been contacted she will need hemodialysis as soon as possible.  She is sedated properly.  She is a pleasant with health issues that are well-documented below.  Noted her CT scan shows bilateral airspace disease she is currently on Zithromax and Rocephin.  ------------- Transferred out of ICU 1/15. Early AM 1/16 had asystolic PEA arrest. Unknown how long she was down for prior to being found in asystole and then required 20 minutes of ACLS prior to ROSC. Intubated and transferred to ICU.  Pertinent  Medical History   Past Medical History:  Diagnosis Date   Anemia    Arthritis    Cancer (HCC)    urterine- surgical removal only tratment   Environmental and seasonal allergies    affects sinuses   ESRD on hemodialysis (HCC)    MWF [facility]   Heart murmur    no one has mentioned it lately- had it as a child   Hypertension    Limb cramps    leg, thigh, hands   Pneumonia    "young"   Shortness of breath dyspnea    With exerion     Significant Hospital Events: Including procedures, antibiotic start and stop dates in addition to other pertinent events   1/9 intubated and admitted to ICU 1/10 extubated to 4 L, having some nausea and vomiting still 1/11 not able to get dialysis due to staffing, on low-dose norepinephrine 1/12: HD catheter placed started on CRRT 1/13 breathing easier continues to wean oxygen stopping CRRT 1/15 transferred out of ICU 1/16 found in asystole, unclear how long she had been down  for. 20 minutes ACLS prior to ROSC (3 epi, 2 bicarb), intubated and transferred to ICU.  Interim History / Subjective:  20 minutes prior to ROSC. Had a lot of pulmonary edema suctioned out of ETT post intubation. In ICU, she is dysynchronous with vent. Slightly hypertensive.  Objective   Blood pressure (!) 141/60, pulse 85, temperature 98 F (36.7 C), temperature source Oral, resp. rate 14, height 5' (1.524 m), weight 69.6 kg, SpO2 (!) 86%.        Intake/Output Summary (Last 24 hours) at 05/19/2023 0409 Last data filed at 05/18/2023 2137 Gross per 24 hour  Intake 120 ml  Output --  Net 120 ml   Filed Weights   05/16/23 0500 05/17/23 0354 05/18/23 0500  Weight: 68.9 kg 68 kg 69.6 kg    Examination:  General: Adult female, critically ill but in NAD. Neuro: Not responsive currently. HEENT: [place]/AT. Sclerae anicteric. ETT in place. Cardiovascular: Regular, 4/6 SEM. Lungs: Respirations even and unlabored. Diffuse crackles and rales bilaterally. Abdomen: BS x 4, soft, NT/ND.  Musculoskeletal: No gross deformities, no edema. LUE AFV Skin: Intact, warm, no rashes.   Resolved problem list   Nausea and vomiting  Shock Aspiration  Assessment & Plan:  Acute hypoxemic resp failure due to Pulmonary edema and volume overload complicated by probable aspiration  -extubated on 11th. breathing better w/ volume off. PCT normalized. -reintubated early AM 1/16 after arrest on floor Plan Continue full vent support Will need Volume removal  via HD, last weights from 1/15 still show 3 lbs over admission weight from 1/9 despite being net -3.5L since admit Needs walking oximetry prior to dc to determine O2 needs Mobilize when able  CXR intermittently  Asystolic Arrest 1/16 - unclear etiology at this point. 20 minutes to ROSC (though unknown downtime prior to this). Concern acute pulmonary flash edema? Post arrest EKG with STD anterolaterally. No significant lab abnormalities from 1/15 to point to  a specific cause Hypotension - post arrest + sedation related - Supportive care - ELINK to follow labs - Start phenylephrine via PIV (has severe AS at baseline; therefore, will use phenylephrine to start over norepi). 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 Plan Cont tele  Cont midodrine, change from 5mg  (PTA dose and lowered back from 10 this admit), back to 10mg  TID Continue phenylephrine as above, goal SBP > 90  Abnormal echocardiogram with severe calcific valvular disease rather than IE, negative cultures Plan Supportive care  ESRD on HD M/W/F - was on CRRT earlier this admit then transitioned to [facility] 1/14 Plan Day team to please notify nephrology, might need additional HD session today Thursday 1/16 for further volume removal as she sounds quite wet Follow chemistry  Frail elderly w/ Muscular deconditioning Plan Supportive care Mobilize when able PT/OT  Thrombocytopenia- stable in 80s Plan Monitor  Knee pain following a fall - knee Xray with severe patellofemoral OA and mild to mod joint effusion Plan Supportive care Monitor effusion, if worsens or concern for infection might need to ger ortho involved for arthrocentesis  Goals of care - Needs ongoing goals of care discussions given age, frailty, chronic illnesses. I did mention this with family on update after arrest this AM. Family aware and planning to meet to discuss things - Consider palliative care input  Best Practice (right click and "Reselect all SmartList Selections" daily)   Diet/type: NPO DVT prophylaxis prophylactic heparin  Pressure ulcer(s): N/A GI prophylaxis: H2B Lines: N/A Foley:  N/A Code Status:  full code Last date of multidisciplinary goals of care discussion [if does not turn around may need GOC discussion] Called niece [name] and sister [name] [phone] for update 1/16. They plan to  come to the hospital around 8-9am and also plan to have further family discussions with extended family today.   CC time: 40 min.   Rutherford Guys, PA - [facility] For pager details, please see AMION or use Epic chat  After 1900, please call [facility] for cross coverage needs 05/19/2023, 4:48 AM

## 2023-05-19 NOTE — Progress Notes (Signed)
Hold HD for now per Dr Marisue Humble

## 2023-05-19 NOTE — Progress Notes (Signed)
OT Cancellation Note  Patient Details Name: [name] MRN: [id number] DOB: [birth year]   Cancelled Treatment:    Reason Eval/Treat Not Completed: Medical issues which prohibited therapy (Pt with PEA arrest and placed on vent early this morning. Per RN, OT to hold for today and will reattempt to see pt tomorrow as appropriate/available.)  Rosanne Sack "Orson Eva., OTR/L, MA Acute Rehab [phone]   Lendon Colonel 05/19/2023, 1:11 PM

## 2023-05-19 NOTE — Progress Notes (Signed)
eLink Physician-Brief Progress Note Patient Name: [name] DOB: 19-Jan-1941 MRN: [id number]   Date of Service  05/19/2023  HPI/Events of Note  PEA arrest - ROSC after 20 mins ESRD on HD adm with acute pulm edema with pneumonia  eICU Interventions  Vent orders Intermittent fent prn  Await CXR/ABG /EKG Etiology unclear     Intervention Category Evaluation Type: New Patient Evaluation  [name] V. Armand Preast 05/19/2023, 3:56 AM

## 2023-05-19 NOTE — Progress Notes (Signed)
RT at bedside for pt arrival to [facility]. Pt being manually ventilated upon arrival, RT placed pt on ventilator. Will obtain ABG after 1 hour.

## 2023-05-19 NOTE — Progress Notes (Signed)
Chaplain responds to code blue and provides compassionate presence as pt is coded and transferred to [facility].No family had arrived 80 minutes later. Pt stabilized.

## 2023-05-19 NOTE — Progress Notes (Signed)
83 yo F ESRD on HD initially admitted to [facility] service 1/9 for acute resp failure 2/2 missed HD, requiring brief intubation as well as pressors to facilitate CRRT; transferred out of ICU 1/15 who then sustained a cardiac arrest -- reportedly asystole initially x 20 min of resusc until ROSC -- and was transferred back to the ICU in critical condition.  Over the course of the day the pt has been changed from neo to NE for her bradycardia. HD was deferred in favor of pursuing goals of care conversations.  Family met w palliative care 1/16 and they are struggling with this perceived abrupt change in clinical status and it sounds like needs more time to discuss things like code status, GOC.   Case d/w my attending.  For tonight, RRT is still deferred.  There is an echo ordered, trops are still pending, as well as general labs Cont periph pressors  -- pending her HR, consider CVC placement and epi but seems to have leveled out  Cont weaning sedation as able  I worry that her prognosis is likely poor. Appreciate Palliative's ongoing efforts     Tessie Fass MSN, AGACNP-BC [facility] Pulmonary/Critical Care Medicine Amion for pager  05/19/2023, 5:01 PM

## 2023-05-19 NOTE — Progress Notes (Signed)
IP rehab admissions - Noted PEA arrest and placement on the vent.  I will sign off for CIR at this time.  Once patient is off the vent and is participating with therapies, can then re-visit potential need for CIR admission.  Call me for questions.  [phone]

## 2023-05-19 NOTE — Progress Notes (Signed)
   05/19/23 1100  Spiritual Encounters  Type of Visit Initial  Care provided to: Patient  Conversation partners present during encounter Nurse  Referral source Chaplain assessment  Reason for visit Routine spiritual support  OnCall Visit No   Chaplain followed-up with patient. The family was online while I arrived. Chaplain did not engage family because I did not want to upset them. Chaplain provided compassionate presence. Chaplain remains available when needed.

## 2023-05-19 NOTE — Progress Notes (Addendum)
eLink Physician-Brief Progress Note Patient Name: [name] DOB: Jul 03, 1940 MRN: [id number]   Date of Service  05/19/2023  HPI/Events of Note  Repeat labs show hyperkalemia 5.6, rising creatinine, elevated troponin as would be expected postarrest Lactate decreasing  eICU Interventions  Lokelma x 1     Intervention Category Intermediate Interventions: Diagnostic test evaluation  [name] V. Lazlo Tunney 05/19/2023, 10:26 PM  Spoke to [facility], she is leaning towards no CPR but would like to confirm this with patient's sister before making a decision tomorrow morning  12:05 AM  Patient's PIV that had Levo going in it, infiltrated- Extravasation order set  4:52 AM

## 2023-05-19 NOTE — Progress Notes (Signed)
At 0307 Tele called to report patient in asystole. Found patient unresponsive, no response to sternal rub. Code Blue activated, once pt stable, pt transferred to [facility].

## 2023-05-19 NOTE — TOC Progression Note (Signed)
Transition of Care [facility]) - Progression Note    Patient Details  Name: [name] MRN: [id number] Date of Birth: [birth year]  [facility] CM/SW Contact  Graves-Bigelow, Lamar Laundry, RN Phone Number: 05/19/2023, 4:11 PM  Clinical Narrative: Patient was discussed in progression rounds. Per Staff RN patient will have a GOC meeting with palliative care today. Case Manager will continue to follow for transition of care needs.  Expected Discharge Plan: Skilled Nursing Facility Barriers to Discharge: Continued Medical Work up  Expected Discharge Plan and Services   Discharge Planning Services: CM Consult   Living arrangements for the past 2 months: Apartment  Social Determinants of Health (SDOH) Interventions SDOH Screenings   Food Insecurity: No Food Insecurity (05/16/2023)  Housing: Unknown (05/16/2023)  Transportation Needs: No Transportation Needs (05/16/2023)  Utilities: Not At Risk (05/16/2023)  Social Connections: Patient Unable To Answer (05/12/2023)  Tobacco Use: Low Risk  (05/12/2023)    Readmission Risk Interventions     No data to display

## 2023-05-19 NOTE — Consult Note (Signed)
Consultation Note Date: 05/19/2023   Patient Name: [name]  DOB: 01/17/1941  MRN: [id number]  Age / Sex: 83 y.o., adult   PCP: [facility], [facility] Referring Physician: Cheri Fowler, MD  Reason for Consultation: Establishing goals of care  Palliative Care Assessment and Plan Summary of Established Goals of Care and Medical Treatment Preferences    Contacts/Participants in Discussion: Primary Decision Maker: [name], Niece- HPOA HCPOA: yes    Code Status/Advance Care Planning: Full Code   Additional Recommendations (Limitations, Scope, Preferences): Recommend DNR and comfort care   Psycho-social/Spiritual:  Support System: Family   Prognosis: Hours - Days  Discharge Planning:  To Be Determined   Life limiting illness: ESRD on HD, multisystem organ failure, PEA arrest with ROSC after 20 minutes      Chief Complaint/History of Present Illness:  83 year old female who missed dialysis on 05/12/23 and presented to [facility] ED after syncopal episode via EMS with volume overload and respiratory distress. She was found to have acute pulmonary edema d/t volume overload and bilateral multifocal pneumonia. She was intubated and admitted to ICU. She underwent extubation 05/13/23 and was subsequently transferred to renal floor 05/17/23. Early this am, she had PEA arrest with ROSC after 20 minutes CPR and transferred to [facility]. PMT was consulted to discuss GOC with family.   Primary Diagnoses  Present on Admission:  Acute hypoxemic respiratory failure (HCC)  Anemia of chronic disease  Secondary hyperparathyroidism (HCC)  Volume overload  Chronic hypotension (on midodrine)  Aortic regurgitation  (HFpEF) heart failure with preserved ejection fraction (HCC)  Physical deconditioning  Thrombocytopenia (HCC)    Subjective Sedated, mechanically ventilated.    Extensive chart review has been completed prior to meeting with patient/family  including labs, vital signs, imaging,  progress/consult notes, orders, medications and available advance directive documents.  Past Medical History:  Diagnosis Date   Anemia    Arthritis    Cancer (HCC)    urterine- surgical removal only tratment   Environmental and seasonal allergies    affects sinuses   ESRD on hemodialysis (HCC)    MWF [facility]   Heart murmur    no one has mentioned it lately- had it as a child   Hypertension    Limb cramps    leg, thigh, hands   Pneumonia    "young"   Shortness of breath dyspnea    With exerion   Social History   Socioeconomic History   Marital status: Single    Spouse name: Not on file   Number of children: Not on file   Years of education: Not on file   Highest education level: Not on file  Occupational History   Not on file  Tobacco Use   Smoking status: Never   Smokeless tobacco: Never  Vaping Use   Vaping status: Never Used  Substance and Sexual Activity   Alcohol use: No   Drug use: No   Sexual activity: Not on file  Other Topics Concern   Not on file  Social History Narrative   Not on file   Social Drivers of Health   Financial Resource Strain: Not on file  Food Insecurity: No Food Insecurity (05/16/2023)   Hunger Vital Sign    Worried About Running Out of Food in the Last Year: Never true    Ran Out of Food in the Last Year: Never true  Transportation Needs: No Transportation Needs (05/16/2023)   PRAPARE - Administrator, Civil Service (Medical): No  Lack of Transportation (Non-Medical): No  Physical Activity: Not on file  Stress: Not on file  Social Connections: Patient Unable To Answer (05/12/2023)   Social Connection and Isolation Panel [NHANES]    Frequency of Communication with Friends and Family: Patient unable to answer    Frequency of Social Gatherings with Friends and Family: Patient unable to answer    Attends Religious Services: Patient unable to answer    Active Member of Clubs or Organizations: Patient unable to answer     Attends Banker Meetings: Patient unable to answer    Marital Status: Patient unable to answer   Family History  Problem Relation Age of Onset   Cancer - Lung Mother    Renal Disease Father    Heart attack Father    Scheduled Meds:  [START ON 05/20/2023] calcitRIOL  1.25 mcg Per Tube Q M,W,F-1800   calcium carbonate  500 mg of elemental calcium Per Tube TID WC   Chlorhexidine Gluconate Cloth  6 each Topical Daily   darbepoetin (ARANESP) injection - DIALYSIS  60 mcg Subcutaneous Q Tue-1800   docusate  100 mg Per Tube BID   famotidine  20 mg Per Tube BID   heparin  5,000 Units Subcutaneous Q8H   insulin aspart  0-9 Units Subcutaneous Q4H   midodrine  5 mg Per Tube BID WC   multivitamin  1 tablet Per Tube BID   polyethylene glycol  17 g Per Tube Daily   Continuous Infusions:  sodium chloride     sodium chloride     norepinephrine     phenylephrine (NEO-SYNEPHRINE) Adult infusion 70 mcg/min (05/19/23 0800)   propofol (DIPRIVAN) infusion 10 mcg/kg/min (05/19/23 0800)   PRN Meds:.acetaminophen, docusate, fentaNYL (SUBLIMAZE) injection, fentaNYL (SUBLIMAZE) injection, norepinephrine, ondansetron (ZOFRAN) IV, mouth rinse, mouth rinse, polyethylene glycol, sodium chloride flush Medications Prior to Admission:  Prior to Admission medications   Medication Sig Start Date End Date Taking? Authorizing Provider  cyanocobalamin 1000 MCG tablet Take 1 tablet (1,000 mcg total) by mouth daily. 03/19/23  Yes Elgergawy, Leana Roe, MD  lidocaine-prilocaine (EMLA) cream Apply 1 application topically daily as needed for pain. 05/23/18  Yes [provider]  midodrine (PROAMATINE) 5 MG tablet Take 1 tablet (5 mg total) by mouth 2 (two) times daily with a meal. 03/19/23  Yes Elgergawy, Leana Roe, MD  B Complex-C-Folic Acid (DIALYVITE TABLET) TABS Take 1 tablet by mouth daily. Patient not taking: Reported on 03/14/2023 03/13/18   [provider]  calcitRIOL (ROCALTROL) 0.5 MCG  capsule Take 1 capsule by mouth 2 (two) times daily. Patient not taking: Reported on 03/14/2023 11/26/15   [provider]  calcium acetate (PHOSLO) 667 MG capsule Take 1,334-2,001 mg by mouth See admin instructions. 2001mg  three times daily with meals and 1334mg  with two snacks daily Patient not taking: Reported on 03/14/2023 01/12/16   [provider]   No Known Allergies CBC:    Component Value Date/Time   WBC 12.6 (H) 05/19/2023 0348   HGB 10.2 (L) 05/19/2023 0541   HCT 30.0 (L) 05/19/2023 0541   PLT 110 (L) 05/19/2023 0348   MCV 110.6 (H) 05/19/2023 0348   NEUTROABS 8.2 (H) 05/19/2023 0348   LYMPHSABS 3.4 05/19/2023 0348   MONOABS 0.8 05/19/2023 0348   EOSABS 0.3 05/19/2023 0348   BASOSABS 0.0 05/19/2023 0348   Comprehensive Metabolic Panel:    Component Value Date/Time   NA 138 05/19/2023 0541   K 4.3 05/19/2023 0541   CL 98  05/19/2023 0348   CO2 19 (L) 05/19/2023 0348   BUN 40 (H) 05/19/2023 0348   CREATININE 6.52 (H) 05/19/2023 0348   GLUCOSE 255 (H) 05/19/2023 0348   CALCIUM 9.6 05/19/2023 0348   AST 223 (H) 05/19/2023 0348   ALT 387 (H) 05/19/2023 0348   ALKPHOS 91 05/19/2023 0348   BILITOT 1.5 (H) 05/19/2023 0348   PROT 6.6 05/19/2023 0348   ALBUMIN 3.3 (L) 05/19/2023 0348   Physical Exam Vitals reviewed.  Constitutional:      Appearance: She is ill-appearing.  Cardiovascular:     Rate and Rhythm: Bradycardia present.  Pulmonary:     Comments: Mechanical ventilation  Skin:    General: Skin is warm and dry.  Neurological:     Comments: Sedated     Physical Exam: Vital Signs: BP (!) 101/50   Pulse (!) 48   Temp (!) 97.4 F (36.3 C) (Oral)   Resp 18   Ht 5' (1.524 m)   Wt 69.4 kg   LMP  (LMP Unknown)   SpO2 100%   BMI 29.88 kg/m  SpO2: SpO2: 100 % O2 Device: O2 Device: Ventilator O2 Flow Rate: O2 Flow Rate (L/min): 2 L/min Intake/output summary:  Intake/Output Summary (Last 24 hours) at 05/19/2023 1001 Last data filed at  05/19/2023 0800 Gross per 24 hour  Intake 273.27 ml  Output --  Net 273.27 ml   LBM: Last BM Date : 05/19/23 Baseline Weight: Weight: 68 kg Most recent weight: Weight: 69.4 kg  Assessment:  Karlene Hoselton is an 83 yo elderly female with PMHx significant for anemia, ESRD on HD and HTN. Elderly AA female noted to be sedated, mechanically ventilated. She is unresponsive to touch or voice. Prop and neo infusion.   Spoke with [name], great niece/HPOA via phone. Tentatively scheduled meeting with family in person and over phone scheduled today @ 1400 to discuss GOC. Prepared that we will be discussing difficult decisions.   1500:  Met with [name]- niece and nephew in person, [name], great- niece via phone along with [name], PMT and primary RN.   The family shares that Devann is very active in her church. She resides with her niece [name]. Deborahann does not have children of her own, but thinks of nieces as children and great nieces, age 72 and 29, as her grandchildren.   The patient's family verbalized understanding that their family member is critically ill, that HD is no longer recommended and overnight event of PEA arrest that required CPR. Extensive conversation discussing bradycardia and high risk of coding again with poor outcome with poor quality of life with risk of anoxic brain injury. While discussing GOC, [name] states that they need to give her every chance they can. It was further emphasized that CPR would be more traumatic than helpful concerning their aunt. The patients family members were not willing to make DNR decision at this time as they all need time to process the information. Family did ask good questions about DNR status and course of events. They are struggling between her level of suffering and wanting to ensure that they give her every chance to live. [name] to arrive this evening ~1945 and hopeful to speak with someone further about GOC.                         Palliative  Performance Scale: 10%  Additional Data Reviewed: Recent Labs    05/18/23 0306 05/19/23 0348 05/19/23 0541  WBC 7.0  12.6*  --   HGB 9.1* 9.6* 10.2*  PLT 100* 110*  --   NA 137 138 138  BUN 22 40*  --   CREATININE 4.00* 6.52*  --      Time Total: 90 minutes   Detailed review of medical records ( labs, imaging, vital signs), medically appropriate exam ( MS, skin, cardiac, resp) discussed with treatment team, counseling and education to patient, family, staff, documenting clinical information, medication management, coordination of care    Shon Hough, NP  05/19/2023, 10:01 AM  Office [phone]  Pager [phone]

## 2023-05-20 ENCOUNTER — Inpatient Hospital Stay (HOSPITAL_COMMUNITY): Payer: Medicare PPO

## 2023-05-20 DIAGNOSIS — J9601 Acute respiratory failure with hypoxia: Secondary | ICD-10-CM | POA: Diagnosis not present

## 2023-05-20 DIAGNOSIS — Z515 Encounter for palliative care: Secondary | ICD-10-CM

## 2023-05-20 DIAGNOSIS — I5031 Acute diastolic (congestive) heart failure: Secondary | ICD-10-CM | POA: Diagnosis not present

## 2023-05-20 DIAGNOSIS — Z992 Dependence on renal dialysis: Secondary | ICD-10-CM

## 2023-05-20 DIAGNOSIS — Z7189 Other specified counseling: Secondary | ICD-10-CM

## 2023-05-20 LAB — ECHOCARDIOGRAM LIMITED
AR max vel: 0.49 cm2
AV Area VTI: 0.45 cm2
AV Area mean vel: 0.43 cm2
AV Mean grad: 63 mm[Hg]
AV Peak grad: 106.5 mm[Hg]
Ao pk vel: 5.16 m/s
Calc EF: 57.6 %
Height: 60 in
MV M vel: 6.2 m/s
MV Peak grad: 153.8 mm[Hg]
S' Lateral: 3 cm
Single Plane A2C EF: 51.7 %
Single Plane A4C EF: 63 %
Weight: 2493.84 [oz_av]

## 2023-05-20 LAB — GLUCOSE, CAPILLARY
Glucose-Capillary: 103 mg/dL — ABNORMAL HIGH (ref 70–99)
Glucose-Capillary: 106 mg/dL — ABNORMAL HIGH (ref 70–99)
Glucose-Capillary: 109 mg/dL — ABNORMAL HIGH (ref 70–99)
Glucose-Capillary: 112 mg/dL — ABNORMAL HIGH (ref 70–99)
Glucose-Capillary: 139 mg/dL — ABNORMAL HIGH (ref 70–99)
Glucose-Capillary: 93 mg/dL (ref 70–99)

## 2023-05-20 LAB — COMPREHENSIVE METABOLIC PANEL
ALT: 280 U/L — ABNORMAL HIGH (ref 0–44)
AST: 122 U/L — ABNORMAL HIGH (ref 15–41)
Albumin: 3.4 g/dL — ABNORMAL LOW (ref 3.5–5.0)
Alkaline Phosphatase: 71 U/L (ref 38–126)
Anion gap: 16 — ABNORMAL HIGH (ref 5–15)
BUN: 52 mg/dL — ABNORMAL HIGH (ref 8–23)
CO2: 24 mmol/L (ref 22–32)
Calcium: 9.6 mg/dL (ref 8.9–10.3)
Chloride: 98 mmol/L (ref 98–111)
Creatinine, Ser: 7.8 mg/dL — ABNORMAL HIGH (ref 0.44–1.00)
GFR, Estimated: 5 mL/min — ABNORMAL LOW (ref >60)
Glucose, Bld: 118 mg/dL — ABNORMAL HIGH (ref 70–99)
Potassium: 5.4 mmol/L — ABNORMAL HIGH (ref 3.5–5.1)
Sodium: 138 mmol/L (ref 135–145)
Total Bilirubin: 2 mg/dL — ABNORMAL HIGH (ref 0.0–1.2)
Total Protein: 6.6 g/dL (ref 6.5–8.1)

## 2023-05-20 LAB — PHOSPHORUS: Phosphorus: 4.9 mg/dL — ABNORMAL HIGH (ref 2.5–4.6)

## 2023-05-20 LAB — TRIGLYCERIDES: Triglycerides: 108 mg/dL (ref <150)

## 2023-05-20 LAB — MAGNESIUM: Magnesium: 2.7 mg/dL — ABNORMAL HIGH (ref 1.7–2.4)

## 2023-05-20 MED ORDER — VITAL 1.5 CAL PO LIQD
1000.0000 mL | ORAL | Status: DC
  Administered 2023-05-20 – 2023-05-21 (×2): 1000 mL

## 2023-05-20 MED ORDER — ORAL CARE MOUTH RINSE: 15.0000 mL | OROMUCOSAL | Status: DC | PRN

## 2023-05-20 MED ORDER — NITROGLYCERIN 2 % TD OINT
1.0000 [in_us] | TOPICAL_OINTMENT | Freq: Three times a day (TID) | TRANSDERMAL | Status: DC
  Administered 2023-05-20 – 2023-05-21 (×4): 1 [in_us] via TOPICAL
  Filled 2023-05-20: qty 30

## 2023-05-20 MED ORDER — PIVOT 1.5 CAL PO LIQD: 1000.0000 mL | ORAL | Status: DC

## 2023-05-20 MED ORDER — ATROPINE SULFATE 1 MG/ML IV SOLN: 0.4000 mg | Freq: Once | INTRAVENOUS | Status: DC | PRN

## 2023-05-20 MED ORDER — PROSOURCE TF20 ENFIT COMPATIBL EN LIQD
60.0000 mL | Freq: Every day | ENTERAL | Status: DC
  Administered 2023-05-20 – 2023-05-21 (×2): 60 mL
  Filled 2023-05-20 (×2): qty 60

## 2023-05-20 MED ORDER — ORAL CARE MOUTH RINSE
15.0000 mL | OROMUCOSAL | Status: DC
  Administered 2023-05-20 – 2023-05-21 (×13): 15 mL via OROMUCOSAL

## 2023-05-20 MED ORDER — PHENTOLAMINE MESYLATE 5 MG IJ SOLR
5.0000 mg | Freq: Once | INTRAMUSCULAR | Status: AC
  Administered 2023-05-20: 5 mg via SUBCUTANEOUS

## 2023-05-20 MED ORDER — ATROPINE SULFATE 1 MG/10ML IJ SOSY
PREFILLED_SYRINGE | INTRAMUSCULAR | Status: AC
  Filled 2023-05-20: qty 10

## 2023-05-20 MED ORDER — SODIUM ZIRCONIUM CYCLOSILICATE 10 G PO PACK
10.0000 g | PACK | Freq: Once | ORAL | Status: AC
  Administered 2023-05-20: 10 g
  Filled 2023-05-20: qty 1

## 2023-05-20 NOTE — Progress Notes (Signed)
Brief nephrology progress note/update  I participated in a goals of care meeting with Tessie Fass CCM and primary RN.  Specifically I discussed that despite use of CRRT with removal of fluid and improvement in volume status she was not stable long enough to receive intermittent hemodialysis.  Additionally, use of intermittent hemodialysis prior to the past weekend failed to improve her presenting symptoms.  I discussed my long-term relationship with her as her outpatient nephrologist.  We also reviewed the conversation I had with her in the morning before her cardiac arrest where she acknowledged her tenuous status and potential for rapid decline.  We have optimized and addressed as many issues as possible.  She has a sick heart with significant valvular disease.  Additional hemodialysis is of little clinical benefit and likely would cause additional harm and complication.  I have recommended that she is no longer suitable for continued dialysis.  Family asked several questions about this but appear to be in agreement.  No further dialysis to be offered.

## 2023-05-20 NOTE — IPAL (Addendum)
  Interdisciplinary Goals of Care Family Meeting   Date carried out: 05/20/2023  Location of the meeting: Bedside  Member's involved: Nurse Practitioner, Bedside Registered Nurse, Family Member or next of kin, and Other: Nephrologist   Durable Power of Attorney or acting medical decision maker: [name]    Discussion: We discussed goals of care for [name] .   We talked about [name]' complex clinical situation, both regarding her critical illness considerations and her chronic illnesses. She is in a really difficult spot regarding dialysis tolerance and her severe baseline cardiac disease, now complicated cardiac arrest, resp failure etc. Her family articulates that her body isnt strong enough to tolerate dialysis. I shared my concern for the same-- especially in light of prior optimization efforts, ongoing hemodynamic fragility. It was well recognized that in absence of HD [name]' days are quite limited. We talked about what continuing current efforts (MV, pressors) would look like with worsening hypervolemia, her cardiac issues vs what a comfort care path would look like. We talked about different code statuses.   I think family does understand the medical gravity of the current situation, and reality of mortality. There is a lot of uncertainty with what [name] would want -- at times family voicing that she would want to try everything until god calls her (which was clarified as failure to obtain ROSC, full code), and at times there being consideration to comfort measures. Considerations of the patient's mental status (following commands) seems to play a big role in the push/pull between GOC paths. We talked about what to expect regarding mentation in setting of her ESRD, possibility of needing sedating medications to facilitate vent management with progressive volume overload.   [name] was present for the last half hour or so of this meeting and was able to provide a lot of helpful insight  for the family as well.  For now family needs time to think and process. [name] has asked that I circle back with her later today to discuss further.   D/w palliative care NP after the meeting as well and the plan will be for palliative to follow up with family tomorrow   Code status:   Code Status: Full Code   Disposition: Continue current acute care  Time spent for the meeting: CCT 90 min     Denise Clam, NP  05/20/2023, 4:01 PM

## 2023-05-20 NOTE — Progress Notes (Signed)
Palliative Medicine Progress Note   Patient Name: [name]       Date: 05/20/2023 DOB: 09-08-1940  Age: 83 y.o. MRN#: [id number] Attending Physician: Lorin Glass, MD Primary Care Physician: [place], [facility] Admit Date: 05/12/2023   HPI/Patient Profile: 83 year old female with PMH of ESRD on HD who presented to [facility] ED on 05/12/23 with respiratory distress after missing dialysis. She was admitted with acute respiratory failure due to volume overload and bilateral multifocal pneumonia. She was intubated and admitted to ICU. She was extubated 05/13/23 and was subsequently transferred to renal floor 05/17/23. Early morning on 1/16.  she had PEA arrest with ROSC after 20 minutes CPR and transferred to [facility].  PMT was consulted to discuss GOC with family.   Subjective: Chart reviewed updates received, patient seen at bedside. She remains intubated; failed SBT this morning. She is on vasopressor support with norepinephrine.   I spoke with niece/[name] by phone this morning. Reviewed patient's current clinical status as per above. She is currently on the way to her home in [place] and plans to return to the hospital this afternoon.  She confirms that she is HCPOA, but prefers the family to make any major decisions as as unit. She is agreeable to meet in-person this afternoon.   When I arrived to 2H this afternoon, GOC meeting is in progress with Delorise Shiner NP with PCCM and Dr. Gwynneth Albright. Much conversation was had about the seriousness of patient's current medical situation, inability to tolerate dialysis, and hemodynamic instability.   I plan to follow-up tomorrow, as family needs time to process information from today.    Objective:  Physical Exam Vitals reviewed.   Constitutional:      General: She is not in acute distress.    Comments: Critically ill-appearing  Cardiovascular:     Rate and Rhythm: Bradycardia present.  Pulmonary:     Comments: intubated            Palliative Medicine Assessment & Plan   Assessment: Principal Problem:   Acute hypoxemic respiratory failure (HCC) Active Problems:   Anemia of chronic disease   ESRD on dialysis (HCC)   Volume overload   Secondary hyperparathyroidism (HCC)   Aspiration pneumonia due to gastric secretions (HCC)   Chronic hypotension (on midodrine)   Aortic valvular stenosis with severe calcification  Aortic regurgitation   (HFpEF) heart failure with preserved ejection fraction (HCC)   Acute diastolic (congestive) heart failure (HCC)   Right ventricular dysfunction   Knee pain, left   Physical deconditioning   Thrombocytopenia (HCC)   Pulmonary edema   Fall at home    Recommendations/Plan: Continue full scope interventions for now Ongoing palliative support I will follow-up tomorrow  Code Status: Full code  Prognosis:  Very guarded  Discharge Planning: To Be Determined   Thank you for allowing the Palliative Medicine Team to assist in the care of this patient.   Time: 35 minutes   Merry Proud, NP   Please contact Palliative Medicine Team phone at [phone] for questions and concerns.  For individual providers, please see AMION.

## 2023-05-20 NOTE — Progress Notes (Signed)
Echocardiogram 2D Echocardiogram has been performed.  [name] 05/20/2023, 8:34 AM

## 2023-05-20 NOTE — Progress Notes (Addendum)
At 0445 IV watch alerted to possible infiltration. No blood return was noted. Levophed was stopped and aspirated. Phentolamine was administered around the site. Nitroglyn ointment was applied to site.Site was marked, elevated, and heat was applied. Will continue to monitor.

## 2023-05-20 NOTE — Progress Notes (Signed)
OT Cancellation Note  Patient Details Name: [name] MRN: [id number] DOB: [birth year]   Cancelled Treatment:    Reason Eval/Treat Not Completed: Medical issues which prohibited therapy (Pt with PEA arrest 1/16 and now on PS/CPAP. Per RN, OT to hold for today and will reattempt to see pt at a later date as appropriate/available.)  Bence Trapp "Ronaldo Miyamoto" M., OTR/L, MA Acute Rehab [phone]   Lendon Colonel 05/20/2023, 1:15 PM

## 2023-05-20 NOTE — Progress Notes (Signed)
Brief Nutrition Follow-up:  PEA arrest on 1/16, re-intubated and transferred back to ICU  Noted decline in status, GOG discussions on going, remains full code. Noted per MD, pt is no longer suitable for continued dialysis and no further dialysis to be offered  Labs: potassium 5.4 (H), phosphorus 4.9 (H), magnesium 2.7 (H), CBGs 93-112, elevated LFTs   Interventions:  Agree with trickle TF only today-Vital 1.5 at 20 ml/hr Goal TF: Vital 1.5 at 45 ml/hr providing 1620 kcals, 73 g of protein and 821 mL of free water  Romelle Starcher MS, RDN, LDN, CNSC Registered Dietitian 3 Clinical Nutrition RD Inpatient Contact Info in [place]

## 2023-05-20 NOTE — Progress Notes (Signed)
Admit: 05/12/2023 LOS: 8  19F ESRD on iHD MWF LUE AVF with recurrent AHRF and Vol O/L   Subjective:  Stable, following some commands Off NE CCM and Pallative also working with family re GOC K of 5.4 this AM, given Lokelma 10gm x2  01/16 0701 - 01/17 0700 In: 1067 [I.V.:772; NG/GT:295] Out: 450 [Emesis/NG output:450]  Filed Weights   05/18/23 0500 05/19/23 0423 05/20/23 0500  Weight: 69.6 kg 69.4 kg 70.7 kg    Scheduled Meds:  aspirin  81 mg Per Tube Daily   calcitRIOL  1.25 mcg Per Tube Q M,W,F-1800   calcium carbonate  500 mg of elemental calcium Per Tube TID WC   Chlorhexidine Gluconate Cloth  6 each Topical Daily   darbepoetin (ARANESP) injection - DIALYSIS  60 mcg Subcutaneous Q Tue-1800   docusate  100 mg Per Tube BID   famotidine  20 mg Per Tube Daily   feeding supplement (PROSource TF20)  60 mL Per Tube Daily   heparin  5,000 Units Subcutaneous Q8H   midodrine  10 mg Per Tube BID WC   multivitamin  1 tablet Per Tube BID   nitroGLYCERIN  1 inch Topical Q8H   Continuous Infusions:  sodium chloride     feeding supplement (VITAL 1.5 CAL)     norepinephrine (LEVOPHED) Adult infusion Stopped (05/20/23 0415)   propofol (DIPRIVAN) infusion 20 mcg/kg/min (05/20/23 0900)   PRN Meds:.acetaminophen, dextrose, docusate, fentaNYL (SUBLIMAZE) injection, fentaNYL (SUBLIMAZE) injection, ondansetron (ZOFRAN) IV, mouth rinse, mouth rinse, polyethylene glycol, sodium chloride flush  Current Labs: reviewed   Physical Exam:  Blood pressure (!) 128/47, pulse 63, temperature 98 F (36.7 C), temperature source Oral, resp. rate (!) 25, height 5' (1.524 m), weight 70.7 kg, SpO2 100%. 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 Katrinka Blazing of CCM.  Despite of CRRT earlier in the week to optimize volume status she had a PEA arrest within 48 hours of discontinuation.  Given her age and significant comorbidities especially cardiac I think that we are unlikely to provide significant therapeutic benefit with ongoing dialysis with nearly certain ongoing discomfort and suffering.  When I spoke with the patient before her cardiac arrest she understood these issues quite clearly.  Another goals of care meeting is scheduled for today which I would be happy to participate in.  Based upon that meeting we will decide about additional dialysis.  Medication Issues; Preferred narcotic agents for pain control are hydromorphone, fentanyl, and methadone. Morphine should not be used.  Baclofen should be avoided Avoid oral sodium phosphate and magnesium citrate based laxatives / bowel preps    Sabra Heck MD 05/20/2023, 10:23 AM  Recent Labs  Lab 05/18/23 0306 05/19/23 0348 05/19/23 0541 05/19/23 2101 05/20/23 0240  NA  137 138 138 137 138  K 4.4 4.2 4.3 5.6* 5.4*  CL 100 98  --  97* 98  CO2 24 19*  --  23 24  GLUCOSE 94 255*  --  116* 118*  BUN 22 40*  --  49* 52*  CREATININE 4.00* 6.52*  --  7.37* 7.80*  CALCIUM 9.5 9.6  --  9.6 9.6  PHOS 3.0  --   --  4.8* 4.9*   Recent Labs  Lab 05/17/23 0252 05/18/23 0306 05/19/23 0348 05/19/23 0541  WBC 6.5 7.0 12.6*  --   NEUTROABS  --   --  8.2*  --   HGB 8.7* 9.1* 9.6* 10.2*  HCT 26.1* 27.7* 31.2* 30.0*  MCV 103.2* 106.1* 110.6*  --   PLT 94* 100* 110*  --

## 2023-05-20 NOTE — Progress Notes (Signed)
I have spoken with [name] twice today via phone -- late this morning and again about an hour ago or so.   This morning we talked about when she planned to be back at the hospital, and planned to sit down and talk at that time in person. I asked if she had any questions in the interim and she asked me to be blunt and speculate if I thought the pt would survive another week. I shared my concerns regarding her inability to tolerate HD, and poor candidacy for this. [name] shared how [name] has had a hard time with HD for a while. We talked about the limited life expectancy of a person with ESRD on HD, when they can no longer receive dialysis. We also talked about [name] failed SBT this morning, and my concerns regarding her ESRD & volume status. She expressed understanding and not wanting [name] to suffer. I echoed that sentiment and we make a plan to discuss goals of care further when she arrived. I was asked to update if there were any acute changes in interim.  Pt had a possible vagal episode w bradycardia and hypotension, was somewhat tenuous following this and required pressors to resume.  I called [name] to inform her of this. Expressed my concern regarding [name] fragile state. She expressed understanding and thanked me for the update.     Will await word of her arrival/readiness to talk. Think it would be helpful for nephro to be present as well if possible.     Tessie Fass MSN, AGACNP-BC [facility] Pulmonary/Critical Care Medicine 05/20/2023, 2:11 PM

## 2023-05-20 NOTE — Progress Notes (Signed)
NAME:  [name], MRN:  [id number], DOB:  Sep 29, 1940, LOS: 8 ADMISSION DATE:  05/12/2023, CONSULTATION DATE: 05/12/2023 REFERRING MD: Emergency department physician, CHIEF COMPLAINT: Acute respiratory failure  History of Present Illness:  83 year old female who missed dialysis on Wednesday and presents with volume overload and respiratory distress.  Initially she was tried on BiPAP but vomited therefore she required intubation and full mechanical ventilatory support.  Nephrology has been contacted she will need hemodialysis as soon as possible.  She is sedated properly.  She is a pleasant with health issues that are well-documented below.  Noted her CT scan shows bilateral airspace disease she is currently on Zithromax and Rocephin.  ------------- Transferred out of ICU 1/15. Early AM 1/16 had asystolic PEA arrest. Unknown how long she was down for prior to being found in asystole and then required 20 minutes of ACLS prior to ROSC. Intubated and transferred to ICU.  Pertinent  Medical History   Past Medical History:  Diagnosis Date   Anemia    Arthritis    Cancer (HCC)    urterine- surgical removal only tratment   Environmental and seasonal allergies    affects sinuses   ESRD on hemodialysis (HCC)    MWF [facility]   Heart murmur    no one has mentioned it lately- had it as a child   Hypertension    Limb cramps    leg, thigh, hands   Pneumonia    "young"   Shortness of breath dyspnea    With exerion     Significant Hospital Events: Including procedures, antibiotic start and stop dates in addition to other pertinent events   1/9 intubated and admitted to ICU 1/10 extubated to 4 L, having some nausea and vomiting still 1/11 not able to get dialysis due to staffing, on low-dose norepinephrine 1/12: HD catheter placed started on CRRT 1/13 breathing easier continues to wean oxygen stopping CRRT 1/15 transferred out of ICU 1/16 found in asystole, unclear how long she had been down  for. 20 minutes ACLS prior to ROSC (3 epi, 2 bicarb), intubated and transferred to ICU.  Interim History / Subjective:  No events Following commands on vent On a little prop  Objective   Blood pressure (!) 103/36, pulse (!) 58, temperature 98 F (36.7 C), temperature source Oral, resp. rate 17, height 5' (1.524 m), weight 70.7 kg, SpO2 100%.    Vent Mode: PRVC FiO2 (%):  [40 %] 40 % Set Rate:  [24 bmp] 24 bmp Vt Set:  [360 mL] 360 mL PEEP:  [5 cmH20] 5 cmH20 Plateau Pressure:  [10 cmH20-24 cmH20] 20 cmH20   Intake/Output Summary (Last 24 hours) at 05/20/2023 0757 Last data filed at 05/20/2023 0700 Gross per 24 hour  Intake 1066.97 ml  Output 450 ml  Net 616.97 ml   Filed Weights   05/18/23 0500 05/19/23 0423 05/20/23 0500  Weight: 69.6 kg 69.4 kg 70.7 kg    Examination:  Sedated on vent but will follow commands RASS -1 +murmur RUE fistula Ext warm  ALI noted Trop bump noted Echo pending CBC pending  Resolved problem list   Nausea and vomiting  Shock Aspiration  Assessment & Plan:  Acute hypoxemic resp failure due to Pulmonary edema and volume overload complicated by probable aspiration   Asystolic Arrest 1/16 - follows pattern of breathlessness, pulm edema on day of HD leading to respiratory-driven cardiac event  Chronic renal vasoplegia  Abnormal echocardiogram with severe calcific valvular disease rather than IE, negative  cultures  ESRD on HD M/W/F - was on CRRT earlier this admit then transitioned to [facility] 1/14  Frail elderly w/ Muscular deconditioning  Thrombocytopenia- stable in 80s   Knee pain following a fall - knee Xray with severe patellofemoral OA and mild to mod joint effusion  Goals of care   - Continue vent while we discuss GOC - iHD vs CRRT depending on GOC - If we continue aggressive care she will need a new HD schedule 4-5x/week to keep up with volume - Hold abx for now - Poor prognosis regardless of treatment avenue pursued -f/u  echo but suspect type II event  Best Practice (right click and "Reselect all SmartList Selections" daily)   Diet/type: start TF DVT prophylaxis prophylactic heparin  Pressure ulcer(s): N/A GI prophylaxis: H2B Lines: N/A Foley:  N/A Code Status:  full code Last date of multidisciplinary goals of care discussion: today   33 min cc time C[facility] For pager details, please see AMION or use Epic chat  After 1900, please call [facility] for cross coverage needs 05/20/2023, 7:57 AM

## 2023-05-20 NOTE — Progress Notes (Signed)
eLink Physician-Brief Progress Note Patient Name: [name] DOB: [birth year] MRN: [id number]   Date of Service  05/20/2023  HPI/Events of Note  Bradycardia  eICU Interventions      BP well controlled 130/44 (64) on Levo @ 6 however Pt brady as low as 30-50's.    Obtain EKG Atropine at bedside  [facility] 05/20/2023, 9:15 PM

## 2023-05-20 NOTE — Progress Notes (Signed)
Cortrak Tube Team Note:  Consult received to place a Cortrak feeding tube, however NP and family discussing GOC. Palliative to follow up with family next date.  Pt currently with OG tube for enteral access. Will d/c cortrak tube. Please re-consult as needed.   Cammy Copa., RD, LDN, CNSC See AMiON for contact information

## 2023-05-20 NOTE — Progress Notes (Signed)
 Pt placed on PS/CPAP 5/5 on 40% and is tolerating well. RT will monitor.

## 2023-05-21 DIAGNOSIS — J9601 Acute respiratory failure with hypoxia: Secondary | ICD-10-CM | POA: Diagnosis not present

## 2023-05-21 LAB — COMPREHENSIVE METABOLIC PANEL
ALT: 208 U/L — ABNORMAL HIGH (ref 0–44)
AST: 69 U/L — ABNORMAL HIGH (ref 15–41)
Albumin: 3.2 g/dL — ABNORMAL LOW (ref 3.5–5.0)
Alkaline Phosphatase: 66 U/L (ref 38–126)
Anion gap: 14 (ref 5–15)
BUN: 68 mg/dL — ABNORMAL HIGH (ref 8–23)
CO2: 24 mmol/L (ref 22–32)
Calcium: 9.3 mg/dL (ref 8.9–10.3)
Chloride: 96 mmol/L — ABNORMAL LOW (ref 98–111)
Creatinine, Ser: 9.27 mg/dL — ABNORMAL HIGH (ref 0.44–1.00)
GFR, Estimated: 4 mL/min — ABNORMAL LOW (ref >60)
Glucose, Bld: 144 mg/dL — ABNORMAL HIGH (ref 70–99)
Potassium: 5 mmol/L (ref 3.5–5.1)
Sodium: 134 mmol/L — ABNORMAL LOW (ref 135–145)
Total Bilirubin: 1.4 mg/dL — ABNORMAL HIGH (ref 0.0–1.2)
Total Protein: 6.5 g/dL (ref 6.5–8.1)

## 2023-05-21 LAB — GLUCOSE, CAPILLARY
Glucose-Capillary: 139 mg/dL — ABNORMAL HIGH (ref 70–99)
Glucose-Capillary: 114 mg/dL — ABNORMAL HIGH (ref 70–99)
Glucose-Capillary: 121 mg/dL — ABNORMAL HIGH (ref 70–99)
Glucose-Capillary: 120 mg/dL — ABNORMAL HIGH (ref 70–99)

## 2023-05-21 LAB — MAGNESIUM: Magnesium: 2.8 mg/dL — ABNORMAL HIGH (ref 1.7–2.4)

## 2023-05-21 LAB — PHOSPHORUS: Phosphorus: 6.8 mg/dL — ABNORMAL HIGH (ref 2.5–4.6)

## 2023-05-21 MED ORDER — LORAZEPAM 2 MG/ML IJ SOLN
2.0000 mg | INTRAMUSCULAR | Status: DC | PRN
  Filled 2023-05-21: qty 2

## 2023-05-21 MED ORDER — POLYVINYL ALCOHOL 1.4 % OP SOLN: 1.0000 [drp] | Freq: Four times a day (QID) | OPHTHALMIC | Status: DC | PRN

## 2023-05-21 MED ORDER — ACETAMINOPHEN 650 MG RE SUPP: 650.0000 mg | Freq: Four times a day (QID) | RECTAL | Status: DC | PRN

## 2023-05-21 MED ORDER — ACETAMINOPHEN 325 MG PO TABS
650.0000 mg | ORAL_TABLET | Freq: Four times a day (QID) | ORAL | Status: DC | PRN
Start: 2023-05-21 — End: 2023-05-21

## 2023-05-21 MED ORDER — FENTANYL 2500MCG IN NS 250ML (10MCG/ML) PREMIX INFUSION
0.0000 ug/h | INTRAVENOUS | Status: DC
Start: 2023-05-21 — End: 2023-05-22
  Administered 2023-05-21: 50 ug/h via INTRAVENOUS
  Filled 2023-05-21: qty 250

## 2023-05-21 MED ORDER — GLYCOPYRROLATE 1 MG PO TABS: 1.0000 mg | ORAL_TABLET | ORAL | Status: DC | PRN

## 2023-05-21 MED ORDER — SODIUM ZIRCONIUM CYCLOSILICATE 10 G PO PACK
10.0000 g | PACK | Freq: Two times a day (BID) | ORAL | Status: DC
  Administered 2023-05-21: 10 g
  Filled 2023-05-21: qty 1

## 2023-05-21 MED ORDER — GLYCOPYRROLATE 0.2 MG/ML IJ SOLN
0.2000 mg | INTRAMUSCULAR | Status: DC | PRN
  Administered 2023-05-21: 0.2 mg via INTRAVENOUS
  Filled 2023-05-21: qty 1

## 2023-05-21 MED ORDER — FENTANYL BOLUS VIA INFUSION
100.0000 ug | INTRAVENOUS | Status: DC | PRN
  Administered 2023-05-21: 100 ug via INTRAVENOUS

## 2023-05-21 MED ORDER — GLYCOPYRROLATE 0.2 MG/ML IJ SOLN: 0.2000 mg | INTRAMUSCULAR | Status: DC | PRN

## 2023-06-04 NOTE — Plan of Care (Signed)
  Problem: Education: Goal: Knowledge of General Education information will improve Description: Including pain rating scale, medication(s)/side effects and non-pharmacologic comfort measures Outcome: Progressing   Problem: Health Behavior/Discharge Planning: Goal: Ability to manage health-related needs will improve Outcome: Progressing   Problem: Clinical Measurements: Goal: Ability to maintain clinical measurements within normal limits will improve Outcome: Progressing Goal: Will remain free from infection Outcome: Progressing Goal: Diagnostic test results will improve Outcome: Progressing Goal: Respiratory complications will improve Outcome: Progressing Goal: Cardiovascular complication will be avoided Outcome: Progressing   Problem: Activity: Goal: Risk for activity intolerance will decrease Outcome: Progressing   Problem: Nutrition: Goal: Adequate nutrition will be maintained Outcome: Progressing   Problem: Coping: Goal: Level of anxiety will decrease Outcome: Progressing   Problem: Elimination: Goal: Will not experience complications related to bowel motility Outcome: Progressing Goal: Will not experience complications related to urinary retention Outcome: Progressing   Problem: Pain Management: Goal: General experience of comfort will improve Outcome: Progressing   Problem: Safety: Goal: Ability to remain free from injury will improve Outcome: Progressing   Problem: Skin Integrity: Goal: Risk for impaired skin integrity will decrease Outcome: Progressing   Problem: Education: Goal: Ability to describe self-care measures that may prevent or decrease complications (Diabetes Survival Skills Education) will improve Outcome: Progressing Goal: Individualized Educational Video(s) Outcome: Progressing   Problem: Coping: Goal: Ability to adjust to condition or change in health will improve Outcome: Progressing   Problem: Fluid Volume: Goal: Ability to  maintain a balanced intake and output will improve Outcome: Progressing   Problem: Health Behavior/Discharge Planning: Goal: Ability to identify and utilize available resources and services will improve Outcome: Progressing Goal: Ability to manage health-related needs will improve Outcome: Progressing   Problem: Metabolic: Goal: Ability to maintain appropriate glucose levels will improve Outcome: Progressing   Problem: Nutritional: Goal: Maintenance of adequate nutrition will improve Outcome: Progressing Goal: Progress toward achieving an optimal weight will improve Outcome: Progressing   Problem: Skin Integrity: Goal: Risk for impaired skin integrity will decrease Outcome: Progressing   Problem: Tissue Perfusion: Goal: Adequacy of tissue perfusion will improve Outcome: Progressing   Problem: Activity: Goal: Ability to tolerate increased activity will improve Outcome: Progressing   Problem: Respiratory: Goal: Ability to maintain a clear airway and adequate ventilation will improve Outcome: Progressing   Problem: Role Relationship: Goal: Method of communication will improve Outcome: Progressing

## 2023-06-04 NOTE — Progress Notes (Signed)
Pt extubated to comfort per Katrinka Blazing MD.

## 2023-06-04 NOTE — Plan of Care (Signed)
Post-mortem care completed, patient taken down to the [facility].

## 2023-06-04 NOTE — Progress Notes (Signed)
Late Note Entry- May 23, 2023  Contacted [facility] this morning to be advised of pt's passing on Saturday.   Olivia Canter Renal Navigator [phone]

## 2023-06-04 NOTE — Progress Notes (Signed)
 Family would like to proceed with comfort care. Awaiting remainder of family. Will start fent gtt to try to get her comfortable before terminal extubation. Plan discussed with RT and RN  Myrla Halsted MD PCCM

## 2023-06-04 NOTE — Death Summary Note (Signed)
DEATH SUMMARY   Patient Details  Name: [name] MRN: [id number] DOB: 1941/05/01  Admission/Discharge Information   Admit Date:  05-14-2023  Date of Death: Date of Death: 05/23/2023  Time of Death: Time of Death: 1944/05/30  Length of Stay: 2023/05/14  Referring Physician: Pa, [facility]    Diagnoses  Preliminary cause of death: Renal failure x 10 years Secondary Diagnoses (including complications and co-morbidities):  Principal Problem:   Acute hypoxemic respiratory failure (HCC) Active Problems:   Anemia of chronic disease   ESRD on dialysis (HCC)   Volume overload   Secondary hyperparathyroidism (HCC)   Aspiration pneumonia due to gastric secretions (HCC)   Chronic hypotension (on midodrine)   Aortic valvular stenosis with severe calcification   Aortic regurgitation   (HFpEF) heart failure with preserved ejection fraction (HCC)   Acute diastolic (congestive) heart failure (HCC)   Right ventricular dysfunction   Knee pain, left   Physical deconditioning   Thrombocytopenia (HCC)   Pulmonary edema   Fall at home   Goals of care, counseling/discussion   DNR (do not resuscitate) discussion   Brief Hospital Course (including significant findings, care, treatment, and services provided and events leading to death)  83 year old female who missed dialysis on Wednesday and presents with volume overload and respiratory distress.  Initially she was tried on BiPAP but vomited therefore she required intubation and full mechanical ventilatory support.  Nephrology has been contacted she will need hemodialysis as soon as possible.  She is sedated properly.  She is a pleasant with health issues that are well-documented below.  Noted her CT scan shows bilateral airspace disease she is currently on Zithromax and Rocephin.   ------------- Transferred out of ICU 1/15. Early AM 1/16 had asystolic PEA arrest. Unknown how long she was down for prior to being found in asystole and then  required 20 minutes of ACLS prior to ROSC. Intubated and transferred to ICU.   05/14/2023 intubated and admitted to ICU 1/10 extubated to 4 L, having some nausea and vomiting still 1/11 not able to get dialysis due to staffing, on low-dose norepinephrine 1/12: HD catheter placed started on CRRT 1/13 breathing easier continues to wean oxygen stopping CRRT 1/15 transferred out of ICU 1/16 found in asystole, unclear how long she had been down for. 20 minutes ACLS prior to ROSC (3 epi, 2 bicarb), intubated and transferred to ICU.  Patient had been hospitalized multiple times this year due to inability to maintain respiratory status in between dialysis sessions.  It became apparent that dialysis was no longer an option for her going forward.  After multiple discussions with family, decision made to allow patient to pass peacefully with family at bedside.  Pertinent Labs and Studies  Significant Diagnostic Studies ECHOCARDIOGRAM LIMITED Result Date: 05/20/2023    ECHOCARDIOGRAM LIMITED REPORT   Patient Name:   [name] Date of Exam: 05/20/2023 Medical Rec #:  [id number]       Height:       60.0 in Accession #:    [id number]      Weight:       155.9 lb Date of Birth:  07/30/1940       BSA:          1.679 m Patient Age:    82 years        BP:           112/43 mmHg Patient Gender: F  HR:           59 bpm. Exam Location:  Inpatient Procedure: Limited Echo, Cardiac Doppler and Color Doppler Indications:    Pulmonary Embolus  History:        Patient has prior history of Echocardiogram examinations, most                 recent 05/12/2023.  Sonographer:    Karma Ganja Referring Phys: [id number] GRACE E BOWSER IMPRESSIONS  1. ? loculated left pleural effusion.  2. Left ventricular ejection fraction, by estimation, is 55 to 60%. The left ventricle has normal function. There is moderate left ventricular hypertrophy. Left ventricular diastolic parameters are indeterminate.  3. Left atrial size was moderately  dilated.  4. Right atrial size was mildly dilated.  5. The mitral valve is abnormal. Mild mitral valve regurgitation.  6. The aortic valve is tricuspid. There is severe calcifcation of the aortic valve. There is severe thickening of the aortic valve. Aortic valve regurgitation is mild. Severe aortic valve stenosis.  7. There is severely elevated pulmonary artery systolic pressure.  8. The inferior vena cava is dilated in size with >50% respiratory variability, suggesting right atrial pressure of 8 mmHg. FINDINGS  Left Ventricle: Left ventricular ejection fraction, by estimation, is 55 to 60%. The left ventricle has normal function. There is moderate left ventricular hypertrophy. Left ventricular diastolic parameters are indeterminate. Right Ventricle: There is severely elevated pulmonary artery systolic pressure. The tricuspid regurgitant velocity is 3.54 m/s, and with an assumed right atrial pressure of 15 mmHg, the estimated right ventricular systolic pressure is 65.1 mmHg. Left Atrium: Left atrial size was moderately dilated. Right Atrium: Right atrial size was mildly dilated. Pericardium: Trivial pericardial effusion is present. The pericardial effusion is posterior to the left ventricle. Mitral Valve: The mitral valve is abnormal. There is moderate thickening of the mitral valve leaflet(s). There is moderate calcification of the mitral valve leaflet(s). Mild mitral valve regurgitation. Tricuspid Valve: The tricuspid valve is normal in structure. Tricuspid valve regurgitation is mild. Aortic Valve: The aortic valve is tricuspid. There is severe calcifcation of the aortic valve. There is severe thickening of the aortic valve. Aortic valve regurgitation is mild. Severe aortic stenosis is present. Aortic valve mean gradient measures 63.0  mmHg. Aortic valve peak gradient measures 106.5 mmHg. Aortic valve area, by VTI measures 0.45 cm. Venous: The inferior vena cava is dilated in size with greater than 50%  respiratory variability, suggesting right atrial pressure of 8 mmHg. Additional Comments: ? loculated left pleural effusion. Spectral Doppler performed. Color Doppler performed.  LEFT VENTRICLE PLAX 2D LVIDd:         4.60 cm LVIDs:         3.00 cm LV PW:         1.40 cm LV IVS:        1.50 cm LVOT diam:     1.90 cm LV SV:         64 LV SV Index:   38 LVOT Area:     2.84 cm  LV Volumes (MOD) LV vol d, MOD A2C: 84.5 ml LV vol d, MOD A4C: 81.6 ml LV vol s, MOD A2C: 40.8 ml LV vol s, MOD A4C: 30.2 ml LV SV MOD A2C:     43.7 ml LV SV MOD A4C:     81.6 ml LV SV MOD BP:      47.8 ml RIGHT VENTRICLE  IVC RV Basal diam:  4.00 cm    IVC diam: 2.20 cm RV S prime:     8.92 cm/s TAPSE (M-mode): 2.1 cm LEFT ATRIUM           Index LA diam:      4.60 cm 2.74 cm/m LA Vol (A4C): 80.8 ml 48.13 ml/m  AORTIC VALVE AV Area (Vmax):    0.49 cm AV Area (Vmean):   0.43 cm AV Area (VTI):     0.45 cm AV Vmax:           516.00 cm/s AV Vmean:          372.000 cm/s AV VTI:            1.410 m AV Peak Grad:      106.5 mmHg AV Mean Grad:      63.0 mmHg LVOT Vmax:         89.40 cm/s LVOT Vmean:        56.200 cm/s LVOT VTI:          0.225 m LVOT/AV VTI ratio: 0.16 MR Peak grad: 153.8 mmHg  TRICUSPID VALVE MR Vmax:      620.00 cm/s TR Peak grad:   50.1 mmHg                           TR Vmax:        354.00 cm/s                            SHUNTS                           Systemic VTI:  0.22 m                           Systemic Diam: 1.90 cm Charlton Haws MD Electronically signed by Charlton Haws MD Signature Date/Time: 05/20/2023/9:11:50 AM    Final    DG CHEST PORT 1 VIEW Result Date: 05/19/2023 CLINICAL DATA:  Intubation EXAM: PORTABLE CHEST 1 VIEW COMPARISON:  Earlier today FINDINGS: Retracted endotracheal tube with tip 4 mm above the carina. Improved aeration of the left lung. There is still hazy bilateral airspace opacity with pleural fluid. No pneumothorax. The enteric tube at least reaches the stomach. Chronic cardiac enlargement.  IMPRESSION: Shortened endotracheal tube with tip 4 mm above the carina. Interval reinflation of the left lung. Advanced bilateral airspace disease with pleural fluid. Electronically Signed   By: Tiburcio Pea M.D.   On: 05/19/2023 04:53   DG CHEST PORT 1 VIEW Result Date: 05/19/2023 CLINICAL DATA:  Intubation EXAM: PORTABLE CHEST 1 VIEW COMPARISON:  Two days prior FINDINGS: Right mainstem intubation, estimated at 3 cm below the carina. There is a complete white out of the left chest with volume loss. Right-sided pleural fluid, airspace opacity, and interstitial edema. No pneumothorax. The enlarged heart is shifted to the left in the setting of volume loss. Enteric tube which at least reaches the stomach. Gaseous distension of the upper stomach. There is a follow-up chest radiograph already obtained. IMPRESSION: 1. Right mainstem intubation with left pulmonary collapse. 2. Airspace disease and pleural fluid on the right as seen on prior. Electronically Signed   By: Tiburcio Pea M.D.   On: 05/19/2023 04:52   DG Knee Left Port Result Date: 05/17/2023 CLINICAL DATA:  Left  knee pain. EXAM: PORTABLE LEFT KNEE - 1-2 VIEW COMPARISON:  None Available. FINDINGS: Severe glenohumeral joint space narrowing and peripheral osteophytosis. Moderate lateral moderate joint space narrowing with large peripheral osteophytes. Severe patellofemoral joint space narrowing and predominantly superior degenerative osteophytosis. Mild-to-moderate joint effusion. Mild chronic enthesopathic change at the quadriceps insertion on the patella. No acute fracture or dislocation. Mild atherosclerotic calcifications. IMPRESSION: 1. Severe glenohumeral and patellofemoral osteoarthritis. 2. Mild-to-moderate joint effusion. Electronically Signed   By: Neita Garnet M.D.   On: 05/17/2023 11:29   DG Chest Port 1 View Result Date: 05/17/2023 CLINICAL DATA:  [id number] Acute hypoxemic respiratory failure (HCC) [id number] EXAM: PORTABLE CHEST 1 VIEW  COMPARISON:  05/15/2023 FINDINGS: Right IJ central line is unchanged in positioning. Stable cardiomegaly. Aortic atherosclerosis. Small-moderate bilateral pleural effusions with associated bibasilar opacities. Diffuse interstitial prominence. Slightly improving aeration at the right upper lobe. No pneumothorax. IMPRESSION: Slightly improving aeration within the right upper lobe. Otherwise stable radiographic appearance of the chest with small-moderate bilateral pleural effusions and associated bibasilar opacities. Electronically Signed   By: Duanne Guess D.O.   On: 05/17/2023 09:38   DG Chest Port 1 View Result Date: 05/15/2023 CLINICAL DATA:  83 year old female central line placement. EXAM: PORTABLE CHEST 1 VIEW COMPARISON:  Portable chest 0831 hours today, CTA chest 05/12/2023. FINDINGS: Portable AP semi upright view at 0839 hours. Right IJ dual lumen catheter in place with tip below the carina at the level of the cavoatrial junction. Patient rotation to the left. No pneumothorax. Stable cardiomegaly and mediastinal contours. Ongoing dense left lung base opacification. Right perihilar confluent opacity has progressed since 05/12/2023, and right lung base opacity has not resolved. Pulmonary vascularity is stable. No acute osseous abnormality identified. IMPRESSION: 1. Right IJ central line placed with tip at the level of the cavoatrial junction. No pneumothorax. 2. Otherwise stable Cardiomegaly and ongoing bilateral lung opacification since the CTA 05/12/2023. Electronically Signed   By: Odessa Fleming M.D.   On: 05/15/2023 08:48   DG Abd 1 View Result Date: 05/14/2023 CLINICAL DATA:  Ileus EXAM: ABDOMEN - 1 VIEW COMPARISON:  None Available. FINDINGS: Nonobstructive bowel gas pattern. No radiographic evidence of ileus. No organomegaly, free air. No suspicious calcification. Vascular calcifications noted. Bilateral lower lobe airspace opacities with small bilateral effusions. IMPRESSION: No acute findings in the  abdomen. Electronically Signed   By: Charlett Nose M.D.   On: 05/14/2023 08:44   ECHOCARDIOGRAM COMPLETE Result Date: 05/12/2023    ECHOCARDIOGRAM REPORT   Patient Name:   MIKEL KAELIN Date of Exam: 05/12/2023 Medical Rec #:  [id number]       Height:       60.0 in Accession #:    [id number]      Weight:       164.0 lb Date of Birth:  November 15, 1940       BSA:          1.716 m Patient Age:    82 years        BP:           81/40 mmHg Patient Gender: F               HR:           64 bpm. Exam Location:  Inpatient Procedure: 2D Echo, Cardiac Doppler and Color Doppler Indications:    SBE I33.9  History:        Patient has prior history of Echocardiogram examinations, most  recent 06/21/2018. Aortic Valve Disease.  Sonographer:    Harriette Bouillon RDCS Referring Phys: [id number] Chrissie Noa S MINOR IMPRESSIONS  1. Left ventricular ejection fraction, by estimation, is 60 to 65%. Left ventricular ejection fraction by PLAX is 63 %. The left ventricle has normal function. The left ventricle has no regional wall motion abnormalities. Left ventricular diastolic parameters are consistent with Grade I diastolic dysfunction (impaired relaxation). Elevated left ventricular end-diastolic pressure.  2. Right ventricular systolic function is mildly reduced. The right ventricular size is normal.  3. Left atrial size was moderately dilated.  4. Right atrial size was moderately dilated.  5. Moderate pericardial effusion. The pericardial effusion is anterior to the right ventricle. There is no evidence of cardiac tamponade.  6. Thickening and heavy calcification of the mitral leaflets and annulus. The mitral valve is degenerative. Trivial mitral valve regurgitation. No evidence of mitral stenosis.  7. AVA could not be calculated due to lack of LVOT pulse doppler. Mobile masses noted on the aortic valve could be consistent with vegetation, however, not likely acute given the degree of valvular calcification. The aortic valve has an  indeterminant number of cusps. Aortic valve regurgitation is mild. Moderate to severe aortic valve stenosis. Aortic valve mean gradient measures 34.7 mmHg. Aortic valve Vmax measures 3.73 m/s.  8. The inferior vena cava is normal in size with greater than 50% respiratory variability, suggesting right atrial pressure of 3 mmHg. Comparison(s): Changes from prior study are noted. 06/21/2018: LVEF 60-65%, calcified AV with mild stenosis - MG 9 mmHg. Conclusion(s)/Recommendation(s): Recommend further imaging with TEE if acute endocarditis is suspected - further interrogation of the degree of aortic stenosis is warranted. FINDINGS  Left Ventricle: Left ventricular ejection fraction, by estimation, is 60 to 65%. Left ventricular ejection fraction by PLAX is 63 %. The left ventricle has normal function. The left ventricle has no regional wall motion abnormalities. The left ventricular internal cavity size was normal in size. There is no left ventricular hypertrophy. Left ventricular diastolic parameters are consistent with Grade I diastolic dysfunction (impaired relaxation). Elevated left ventricular end-diastolic pressure. Right Ventricle: The right ventricular size is normal. No increase in right ventricular wall thickness. Right ventricular systolic function is mildly reduced. Left Atrium: Left atrial size was moderately dilated. Right Atrium: Right atrial size was moderately dilated. Pericardium: A moderately sized pericardial effusion is present. The pericardial effusion is anterior to the right ventricle. There is no evidence of cardiac tamponade. Mitral Valve: Thickening and heavy calcification of the mitral leaflets and annulus. The mitral valve is degenerative in appearance. Trivial mitral valve regurgitation. No evidence of mitral valve stenosis. Tricuspid Valve: The tricuspid valve is grossly normal. Tricuspid valve regurgitation is mild. Aortic Valve: AVA could not be calculated due to lack of LVOT pulse doppler.  Mobile masses noted on the aortic valve could be consistent with vegetation, however, not likely acute given the degree of valvular calcification. The aortic valve has an indeterminant number of cusps. Aortic valve regurgitation is mild. Moderate to severe aortic stenosis is present. Aortic valve mean gradient measures 34.7 mmHg. Aortic valve peak gradient measures 55.8 mmHg. Pulmonic Valve: The pulmonic valve was not well visualized. Pulmonic valve regurgitation is not visualized. Aorta: The aortic root and ascending aorta are structurally normal, with no evidence of dilitation. Venous: The inferior vena cava is normal in size with greater than 50% respiratory variability, suggesting right atrial pressure of 3 mmHg. IAS/Shunts: No atrial level shunt detected by color flow Doppler.  LEFT VENTRICLE PLAX  2D LV EF:         Left            Diastology                ventricular     LV e' medial:    2.61 cm/s                ejection        LV E/e' medial:  34.2                fraction by     LV e' lateral:   3.81 cm/s                PLAX is 63      LV E/e' lateral: 23.4                %. LVIDd:         3.60 cm LVIDs:         2.40 cm LV PW:         1.00 cm LV IVS:        1.00 cm LVOT diam:     1.80 cm LVOT Area:     2.54 cm  RIGHT VENTRICLE            IVC RV S prime:     7.18 cm/s  IVC diam: 1.90 cm TAPSE (M-mode): 1.3 cm LEFT ATRIUM         Index LA diam:    3.80 cm 2.21 cm/m  AORTIC VALVE AV Vmax:      373.33 cm/s AV Vmean:     276.667 cm/s AV VTI:       0.794 m AV Peak Grad: 55.8 mmHg AV Mean Grad: 34.7 mmHg  AORTA Ao Root diam: 2.50 cm MITRAL VALVE MV Area (PHT): 3.17 cm    SHUNTS MV Decel Time: 239 msec    Systemic Diam: 1.80 cm MV E velocity: 89.30 cm/s MV A velocity: 85.20 cm/s MV E/A ratio:  1.05 Zoila Shutter MD Electronically signed by Zoila Shutter MD Signature Date/Time: 05/12/2023/5:38:39 PM    Final    CT Angio Chest PE W and/or Wo Contrast Result Date: 05/12/2023 CLINICAL DATA:  Shortness of breath.   Pulmonary embolism suspected. EXAM: CT ANGIOGRAPHY CHEST WITH CONTRAST TECHNIQUE: Multidetector CT imaging of the chest was performed using the standard protocol during bolus administration of intravenous contrast. Multiplanar CT image reconstructions and MIPs were obtained to evaluate the vascular anatomy. RADIATION DOSE REDUCTION: This exam was performed according to the departmental dose-optimization program which includes automated exposure control, adjustment of the mA and/or kV according to patient size and/or use of iterative reconstruction technique. CONTRAST:  75mL OMNIPAQUE IOHEXOL 350 MG/ML SOLN COMPARISON:  Chest x-ray earlier same day FINDINGS: Cardiovascular: The heart is enlarged. Mitral annular calcification evident. Coronary artery calcification is evident. Aortic valve calcification evident. Moderate atherosclerotic calcification is noted in the wall of the thoracic aorta. Enlargement of the pulmonary outflow tract/main pulmonary arteries suggests pulmonary arterial hypertension. There is no filling defect within the opacified pulmonary arteries to suggest the presence of an acute pulmonary embolus. Mediastinum/Nodes: Scattered upper normal mediastinal lymph nodes evident. Soft tissue attenuation in both hilar regions is compatible with collapse/consolidative opacity in the central lungs bilaterally. NG tube is noted in the esophagus. There is no axillary lymphadenopathy. Lungs/Pleura: Endotracheal tube tip is noted in the distal trachea. Interlobular septal thickening is seen in both lungs with areas of ground-glass  and consolidative opacity bilaterally. Small bilateral pleural effusions evident. Upper Abdomen: Subtle nodularity of liver contour raises the question of cirrhosis. Musculoskeletal: No worrisome lytic or sclerotic osseous abnormality. Review of the MIP images confirms the above findings. IMPRESSION: 1. No CT evidence for acute pulmonary embolus. 2. Enlargement of the pulmonary outflow  tract/main pulmonary arteries suggests pulmonary arterial hypertension. 3. Interlobular septal thickening in both lungs with areas of ground-glass and consolidative opacity bilaterally. Imaging features are compatible with pulmonary edema although superimposed pneumonia is a concern. 4. Small bilateral pleural effusions. 5. Endotracheal tube tip is noted in the distal trachea. 6. Subtle nodularity of liver contour raises the question of cirrhosis. 7.  Aortic Atherosclerosis (ICD10-I70.0). Confluent Electronically Signed   By: Kennith Center M.D.   On: 05/12/2023 07:46   DG Chest Port 1 View Result Date: 05/12/2023 CLINICAL DATA:  Shortness of breath.  Syncope. EXAM: PORTABLE CHEST 1 VIEW COMPARISON:  03/14/2023 FINDINGS: Cardiomegaly, vascular congestion. Interstitial prominence throughout the lungs. Small right pleural effusion. No acute bony abnormality. IMPRESSION: Cardiomegaly with vascular congestion and probable interstitial edema. Small right effusion. Electronically Signed   By: Charlett Nose M.D.   On: 05/12/2023 03:53    Microbiology Recent Results (from the past 240 hours)  MRSA Next Gen by PCR, Nasal     Status: None   Collection Time: 05/19/23  4:16 AM   Specimen: Nasal Mucosa; Nasal Swab  Result Value Ref Range Status   MRSA by PCR Next Gen NOT DETECTED NOT DETECTED Final    Comment: (NOTE) The GeneXpert MRSA Assay (FDA approved for NASAL specimens only), is one component of a comprehensive MRSA colonization surveillance program. It is not intended to diagnose MRSA infection nor to guide or monitor treatment for MRSA infections. Test performance is not FDA approved in patients less than 63 years old. Performed at [facility], [address] [place], [place] [address]     Lab Basic Metabolic Panel: Recent Labs  Lab 05/19/23 2101 05/20/23 0240  0350  NA 137 138 134*  K 5.6* 5.4* 5.0  CL 97* 98 96*  CO2 23 24 24   GLUCOSE 116* 118* 144*  BUN 49* 52* 68*   CREATININE 7.37* 7.80* 9.27*  CALCIUM 9.6 9.6 9.3  MG 2.7* 2.7* 2.8*  PHOS 4.8* 4.9* 6.8*   Liver Function Tests: Recent Labs  Lab 05/19/23 2101 05/20/23 0240  0350  AST 156* 122* 69*  ALT 313* 280* 208*  ALKPHOS 73 71 66  BILITOT 2.1* 2.0* 1.4*  PROT 6.7 6.6 6.5  ALBUMIN 3.5 3.4* 3.2*   No results for input(s): "LIPASE", "AMYLASE" in the last 168 hours. No results for input(s): "AMMONIA" in the last 168 hours. CBC: No results for input(s): "WBC", "NEUTROABS", "HGB", "HCT", "MCV", "PLT" in the last 168 hours. Cardiac Enzymes: No results for input(s): "CKTOTAL", "CKMB", "CKMBINDEX", "TROPONINI" in the last 168 hours. Sepsis Labs: Recent Labs  Lab 05/19/23 2101  LATICACIDVEN 2.4*     Lorin Glass 05/26/2023, 4:52 PM

## 2023-06-04 NOTE — Progress Notes (Signed)
Admit: 05/12/2023 LOS: 9  29F ESRD on iHD MWF LUE AVF with recurrent AHRF and Vol O/L   Subjective:  Having bradycardia  Family is accepting of no further dialysis, remains full code  01/17 0701 - 01/18 0700 In: 852.6 [I.V.:393.6; NG/GT:459] Out: -   Filed Weights   05/19/23 0423 05/20/23 0500  0500  Weight: 69.4 kg 70.7 kg 73.2 kg    Scheduled Meds:  aspirin  81 mg Per Tube Daily   calcitRIOL  1.25 mcg Per Tube Q M,W,F-1800   calcium carbonate  500 mg of elemental calcium Per Tube TID WC   Chlorhexidine Gluconate Cloth  6 each Topical Daily   darbepoetin (ARANESP) injection - DIALYSIS  60 mcg Subcutaneous Q Tue-1800   docusate  100 mg Per Tube BID   famotidine  20 mg Per Tube Daily   feeding supplement (PROSource TF20)  60 mL Per Tube Daily   heparin  5,000 Units Subcutaneous Q8H   midodrine  10 mg Per Tube BID WC   multivitamin  1 tablet Per Tube BID   nitroGLYCERIN  1 inch Topical Q8H   mouth rinse  15 mL Mouth Rinse Q2H   Continuous Infusions:  feeding supplement (VITAL 1.5 CAL) 20 mL/hr at  0800   norepinephrine (LEVOPHED) Adult infusion 7 mcg/min ( 0800)   propofol (DIPRIVAN) infusion 25 mcg/kg/min ( 0800)   PRN Meds:.acetaminophen, atropine, dextrose, docusate, fentaNYL (SUBLIMAZE) injection, fentaNYL (SUBLIMAZE) injection, ondansetron (ZOFRAN) IV, mouth rinse, mouth rinse, mouth rinse, polyethylene glycol, sodium chloride flush  Current Labs: reviewed   Physical Exam:  Blood pressure (!) 147/57, pulse (!) 51, temperature 97.6 F (36.4 C), temperature source Oral, resp. rate (!) 24, height 5' (1.524 m), weight 73.2 kg, SpO2 100%. 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  Medication Issues; Preferred narcotic agents for pain control are hydromorphone, fentanyl, and methadone. Morphine should not be used.  Baclofen should be avoided Avoid oral sodium phosphate and magnesium citrate based laxatives / bowel preps    Sabra Heck MD , 9:49 AM  Recent Labs  Lab 05/19/23 2101 05/20/23 0240  0350  NA 137 138 134*  K 5.6* 5.4* 5.0  CL 97* 98 96*  CO2 23 24 24   GLUCOSE 116* 118* 144*  BUN 49* 52* 68*  CREATININE 7.37* 7.80* 9.27*  CALCIUM 9.6 9.6 9.3  PHOS 4.8* 4.9* 6.8*   Recent Labs  Lab 05/17/23 0252 05/18/23 0306 05/19/23 0348 05/19/23 0541  WBC 6.5 7.0 12.6*  --   NEUTROABS  --   --  8.2*  --   HGB 8.7* 9.1* 9.6* 10.2*  HCT  26.1* 27.7* 31.2* 30.0*  MCV 103.2* 106.1* 110.6*  --   PLT 94* 100* 110*  --

## 2023-06-04 NOTE — Progress Notes (Signed)
Palliative Medicine Progress Note   Patient Name: [name]       Date: 05/22/2023 DOB: [birth year]  Age: 83 y.o. MRN#: [id number] Attending Physician: Lorin Glass, MD Primary Care Physician: [place], [facility] Admit Date: 05/12/2023   HPI/Patient Profile: 83 year old female with PMH of ESRD on HD who presented to [facility] ED on 05/12/23 with respiratory distress after missing dialysis. She was admitted with acute respiratory failure due to volume overload and bilateral multifocal pneumonia. She was intubated and admitted to ICU. She was extubated 05/13/23 and was subsequently transferred to renal floor 05/17/23. Early morning on 1/16.  she had PEA arrest with ROSC after 20 minutes CPR and transferred to [facility].  PMT was consulted to discuss GOC with family.   Subjective: Chart reviewed including labs, vital signs, progress notes, orders, and medication.   Per nephrology, family is accepting of no further dialysis.   Patient seen at bedside. Note she is bradycardic.  There are 2 family members in the room; they report niece is currently on her way to the hospital   Discussed with Dr. Katrinka Blazing and patient's RN. Niece has now arrived to bedside and reports family has decided to proceed with transition to comfort care.     Objective:  Physical Exam Vitals reviewed.  Constitutional:      General: She is not in acute distress.    Interventions: She is sedated.     Comments: Critically ill-appearing  Cardiovascular:     Rate and Rhythm: Bradycardia present.  Pulmonary:     Comments: intubated              Palliative Medicine Assessment & Plan   Assessment: Principal Problem:   Acute hypoxemic respiratory failure (HCC) Active Problems:   Anemia of chronic disease   ESRD on  dialysis (HCC)   Volume overload   Secondary hyperparathyroidism (HCC)   Aspiration pneumonia due to gastric secretions (HCC)   Chronic hypotension (on midodrine)   Aortic valvular stenosis with severe calcification   Aortic regurgitation   (HFpEF) heart failure with preserved ejection fraction (HCC)   Acute diastolic (congestive) heart failure (HCC)   Right ventricular dysfunction   Knee pain, left   Physical deconditioning   Thrombocytopenia (HCC)   Pulmonary edema   Fall at home   Goals of care, counseling/discussion  DNR (do not resuscitate) discussion    Recommendations/Plan: Agree with comfort care orders as placed by PCCM Continue propofol infusion Start fentanyl infusion Proceed with compassionate extubation when patient is comfortable and family is ready   Code Status: DNR - comfort  Prognosis:  Likely hours  Discharge Planning: Anticipated Hospital Death   Thank you for allowing the Palliative Medicine Team to assist in the care of this patient.   Time: 25 minutes   Merry Proud, NP   Please contact Palliative Medicine Team phone at [phone] for questions and concerns.  For individual providers, please see AMION.

## 2023-06-04 NOTE — Progress Notes (Signed)
NAME:  [name], MRN:  [id number], DOB:  [birth year], LOS: 9 ADMISSION DATE:  05/12/2023, CONSULTATION DATE: 05/12/2023 REFERRING MD: Emergency department physician, CHIEF COMPLAINT: Acute respiratory failure  History of Present Illness:  83 year old female who missed dialysis on Wednesday and presents with volume overload and respiratory distress.  Initially she was tried on BiPAP but vomited therefore she required intubation and full mechanical ventilatory support.  Nephrology has been contacted she will need hemodialysis as soon as possible.  She is sedated properly.  She is a pleasant with health issues that are well-documented below.  Noted her CT scan shows bilateral airspace disease she is currently on Zithromax and Rocephin.  ------------- Transferred out of ICU 1/15. Early AM 1/16 had asystolic PEA arrest. Unknown how long she was down for prior to being found in asystole and then required 20 minutes of ACLS prior to ROSC. Intubated and transferred to ICU.  Pertinent  Medical History   Past Medical History:  Diagnosis Date   Anemia    Arthritis    Cancer (HCC)    urterine- surgical removal only tratment   Environmental and seasonal allergies    affects sinuses   ESRD on hemodialysis (HCC)    MWF [facility]   Heart murmur    no one has mentioned it lately- had it as a child   Hypertension    Limb cramps    leg, thigh, hands   Pneumonia    "young"   Shortness of breath dyspnea    With exerion     Significant Hospital Events: Including procedures, antibiotic start and stop dates in addition to other pertinent events   1/9 intubated and admitted to ICU 1/10 extubated to 4 L, having some nausea and vomiting still 1/11 not able to get dialysis due to staffing, on low-dose norepinephrine 1/12: HD catheter placed started on CRRT 1/13 breathing easier continues to wean oxygen stopping CRRT 1/15 transferred out of ICU 1/16 found in asystole, unclear how long she had been down  for. 20 minutes ACLS prior to ROSC (3 epi, 2 bicarb), intubated and transferred to ICU.  Interim History / Subjective:  Intubated/sedated on pressors.  HR brady.  Objective   Blood pressure (!) 148/52, pulse (!) 52, temperature 97.6 F (36.4 C), temperature source Oral, resp. rate (!) 24, height 5' (1.524 m), weight 73.2 kg, SpO2 100%.    Vent Mode: PRVC FiO2 (%):  [40 %] 40 % Set Rate:  [24 bmp] 24 bmp Vt Set:  [360 mL] 360 mL PEEP:  [5 cmH20] 5 cmH20 Plateau Pressure:  [20 cmH20-32 cmH20] 32 cmH20   Intake/Output Summary (Last 24 hours) at  1221 Last data filed at  1000 Gross per 24 hour  Intake 966.58 ml  Output --  Net 966.58 ml   Filed Weights   05/19/23 0423 05/20/23 0500  0500  Weight: 69.4 kg 70.7 kg 73.2 kg    Examination:  Chronically ill Crackles at bases Triggers vent Localizes to painful stimuli Ext with edema Fistula good thrill  Labs reviewed  Resolved problem list   Nausea and vomiting  Shock Aspiration  Assessment & Plan:  End stage renal failure- no longer tolerates dialysis due to severe valvular heart failure.  She is at end of life. S/P IHCA- secondary to pulmonary edema Shock state  - Continue supportive care including vent, pressors - Start standing lokelma - Not candidate for further HD as it likely will precipitate another arrest - Continue family discussions, regardless of  treatment path chosen she is at end of life - Prayers offered to family in this difficult time, appreciate palliative's ongoing help  My cc time 31 mins Myrla Halsted MD PCCM

## 2023-06-04 DEATH — deceased

## 2023-07-01 ENCOUNTER — Ambulatory Visit: Payer: Medicare PPO | Admitting: Cardiology
